# Patient Record
Sex: Female | Born: 1990
Health system: Southern US, Community
[De-identification: ages and names within clinical notes are randomized; demographics above are authoritative.]

## PROBLEM LIST (undated history)

## (undated) ENCOUNTER — Inpatient Hospital Stay (HOSPITAL_COMMUNITY): Payer: Self-pay

## (undated) DIAGNOSIS — J45909 Unspecified asthma, uncomplicated: Secondary | ICD-10-CM

## (undated) DIAGNOSIS — O24419 Gestational diabetes mellitus in pregnancy, unspecified control: Secondary | ICD-10-CM

## (undated) DIAGNOSIS — G47411 Narcolepsy with cataplexy: Secondary | ICD-10-CM

## (undated) DIAGNOSIS — G932 Benign intracranial hypertension: Principal | ICD-10-CM

## (undated) DIAGNOSIS — F32A Depression, unspecified: Secondary | ICD-10-CM

## (undated) DIAGNOSIS — F329 Major depressive disorder, single episode, unspecified: Secondary | ICD-10-CM

## (undated) DIAGNOSIS — B999 Unspecified infectious disease: Secondary | ICD-10-CM

## (undated) DIAGNOSIS — R87629 Unspecified abnormal cytological findings in specimens from vagina: Secondary | ICD-10-CM

## (undated) DIAGNOSIS — Z9884 Bariatric surgery status: Secondary | ICD-10-CM

## (undated) DIAGNOSIS — F419 Anxiety disorder, unspecified: Secondary | ICD-10-CM

## (undated) DIAGNOSIS — R630 Anorexia: Secondary | ICD-10-CM

## (undated) DIAGNOSIS — T7840XA Allergy, unspecified, initial encounter: Secondary | ICD-10-CM

## (undated) DIAGNOSIS — F909 Attention-deficit hyperactivity disorder, unspecified type: Secondary | ICD-10-CM

## (undated) DIAGNOSIS — N809 Endometriosis, unspecified: Secondary | ICD-10-CM

## (undated) DIAGNOSIS — Z91410 Personal history of adult physical and sexual abuse: Secondary | ICD-10-CM

## (undated) HISTORY — DX: Allergy, unspecified, initial encounter: T78.40XA

## (undated) HISTORY — PX: GASTRIC BYPASS: SHX52

## (undated) HISTORY — PX: COLPOSCOPY: SHX161

## (undated) HISTORY — DX: Major depressive disorder, single episode, unspecified: F32.9

## (undated) HISTORY — DX: Attention-deficit hyperactivity disorder, unspecified type: F90.9

## (undated) HISTORY — DX: Depression, unspecified: F32.A

## (undated) HISTORY — DX: Anorexia: R63.0

## (undated) HISTORY — DX: Endometriosis, unspecified: N80.9

## (undated) HISTORY — DX: Narcolepsy with cataplexy: G47.411

## (undated) HISTORY — PX: LUMBAR PUNCTURE: SHX1985

## (undated) HISTORY — DX: Anxiety disorder, unspecified: F41.9

## (undated) HISTORY — DX: Personal history of adult physical and sexual abuse: Z91.410

## (undated) HISTORY — PX: COLONOSCOPY: SHX174

## (undated) HISTORY — DX: Unspecified asthma, uncomplicated: J45.909

## (undated) HISTORY — DX: Gestational diabetes mellitus in pregnancy, unspecified control: O24.419

## (undated) HISTORY — DX: Benign intracranial hypertension: G93.2

## (undated) HISTORY — PX: COSMETIC SURGERY: SHX468

## (undated) HISTORY — PX: LAPAROSCOPIC ENDOMETRIOSIS FULGURATION: SUR769

---
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
## 2012-06-25 ENCOUNTER — Telehealth: Payer: Self-pay | Admitting: Diagnostic Neuroimaging

## 2012-06-25 NOTE — Telephone Encounter (Signed)
Called pt and mom to resched 06/26/12 appt. No answer, left vm.

## 2012-06-26 ENCOUNTER — Ambulatory Visit: Payer: Self-pay | Admitting: Diagnostic Neuroimaging

## 2012-07-08 ENCOUNTER — Encounter: Payer: Self-pay | Admitting: Diagnostic Neuroimaging

## 2012-07-08 ENCOUNTER — Ambulatory Visit (INDEPENDENT_AMBULATORY_CARE_PROVIDER_SITE_OTHER): Payer: BC Managed Care – PPO | Admitting: Diagnostic Neuroimaging

## 2012-07-08 VITALS — BP 106/74 | HR 86 | Ht 69.0 in | Wt 281.0 lb

## 2012-07-08 DIAGNOSIS — G932 Benign intracranial hypertension: Secondary | ICD-10-CM

## 2012-07-08 MED ORDER — TOPIRAMATE 50 MG PO TABS: 50.0000 mg | ORAL_TABLET | Freq: Two times a day (BID) | ORAL | Status: DC

## 2012-07-08 NOTE — Progress Notes (Signed)
[facility]  PATIENT: Sarah Estes DOB: [birth year]  REFERRING CLINICIAN: Lyles HISTORY FROM: patient REASON FOR VISIT: follow up  HISTORICAL  CHIEF COMPLAINT:  Chief Complaint  Patient presents with  . pseudotumor cerebri    HISTORY OF PRESENT ILLNESS:   UPDATE 07/08/12: 22 year old female here for evaluation of pseudotumor cerebri. Patient was previously diagnosed and treated by my colleague here in 2012. Since that time patient's symptoms were fairly stable, with serial eye exams. However more recently her symptoms have worsened. She's had history of transient visual obscuration episodes lasting for a few hours, twice per year. More recently she had an episode lasting for several days with significant decreased vision in the right eye. Her serial ophthalmologic and funduscopic exams have not significantly declined although she has subjective decrease visual acuity of the right eye with increasing blind spot. Patient thinks these symptoms have been worsening since December 2013. In terms of her weight she is at her heaviest now, 281 pounds. Reviewing her weight history, patient weighed 215 pounds in 2008, 240 pounds and 2010, 187 pounds in 2011, and currently 281 pounds.  Patient reports previously trying Diamox and Lasix in the past, but stopped taking these medications due to intolerance of side effects.  PRIOR HPI (06/24/10; Dr. Sharene Skeans): "22 year old with endometriosis who was placed on 3 different oral contraceptives in attempt to treat her symptoms. The first made her depressed. The second caused a 30 pound weight gain in 2 months. The third caused blurred vision which did not go away.  Her gynecologist referred her to an ophthalmologist who found evidence of papilledema and made a presumptive diagnosis of pseudotumor cerebri.  I reviewed office notes from Dr. Randon Goldsmith of [facility] for June 15, 2010 and June 22, 2010. He noted  bilateral disc edema and recommended neurological evaluation including MRI, MRV, and depending on the results, lumbar puncture.  I reviewed the MRI scan of the brain without and with contrast which was performed June 15, 2010. This was read as normal, but there is evidence of a partially empty sella turcica. This is the only sign of possible increased intracranial pressure. The optic nerves were normal. MRV was not performed with the venous sinuses appear patent.  Lumbar puncture performed June 16, 2010 showed an opening pressure of 36 cm of water. Taken together, these provided through for the diagnosis of pseudotumor cerebri. The patient was placed on extended release Diamox and had problems of dizziness and drowsiness. Her headaches were not relieved. She was taken off of extended release Diamox and placed on low-dose immediate release Diamox. Her symptoms subsided. She now just has a dull headache."  REVIEW OF SYSTEMS: Full 14 system review of systems performed and notable only for weight gain ringing in the ears trouble swallowing blurred vision double vision loss of vision eye pain memory loss confusion headache difficulty swallowing dizziness passing out insomnia sleepiness.  ALLERGIES: Allergies  Allergen Reactions  . Sulfa Antibiotics     HOME MEDICATIONS: No outpatient prescriptions prior to visit.   No facility-administered medications prior to visit.    PAST MEDICAL HISTORY: Past Medical History  Diagnosis Date  . Endometriosis   . Adult ADHD   . Anxiety and depression   . Pseudotumor cerebri 2011    PAST SURGICAL HISTORY: Past Surgical History  Procedure Laterality Date  . Colonoscopy      FAMILY HISTORY: Family History  Problem Relation Age of Onset  . Diabetes Mother   . Stroke  Maternal Grandmother   . Heart disease Maternal Grandfather     SOCIAL HISTORY:  History   Social History  . Marital Status: Single    Spouse Name: N/A    Number of Children: 0    . Years of Education: N/A   Occupational History  .  [employer]    Pharmacy   Social History Main Topics  . Smoking status: Never Smoker   . Smokeless tobacco: Not on file  . Alcohol Use: No  . Drug Use: No  . Sexually Active: Yes    Birth Control/ Protection: IUD   Other Topics Concern  . Not on file   Social History Narrative   Pt lives at home with her mom, dad, and two brothers.   Caffeine Use- quit using in 04/09/2007     PHYSICAL EXAM  Filed Vitals:   07/08/12 1053  BP: 106/74  Pulse: 86  Height: 5\' 9"  (1.753 m)  Weight: 281 lb (127.461 kg)   Body mass index is 41.48 kg/(m^2).  GENERAL EXAM: Patient is in no distress  CARDIOVASCULAR: Regular rate and rhythm, no murmurs, no carotid bruits  NEUROLOGIC: MENTAL STATUS: awake, alert, language fluent, comprehension intact, naming intact CRANIAL NERVE: BLURRED DISC MARGINS BILATERALLY. Pupils equal and reactive to light, visual fields full to confrontation, extraocular muscles intact, no nystagmus, facial sensation and strength symmetric, uvula midline, shoulder shrug symmetric, tongue midline. MOTOR: normal bulk and tone, full strength in the BUE, BLE SENSORY: normal and symmetric to light touch, pinprick, temperature, vibration COORDINATION: finger-nose-finger, fine finger movements normal REFLEXES: deep tendon reflexes present and symmetric GAIT/STATION: narrow based gait; able to walk on toes, heels and tandem; romberg is negative   DIAGNOSTIC DATA (LABS, IMAGING, TESTING) - I reviewed patient records, labs, notes, testing and imaging myself where available.  Lab Results  Component Value Date   WBC 14.7* 11/12/2011   HGB 15.1* 11/12/2011   HCT 42.9 11/12/2011   MCV 88.5 11/12/2011   PLT 366 11/12/2011      Component Value Date/Time   NA 137 11/12/2011 1316   K 5.6* 11/12/2011 1316   CL 104 11/12/2011 1316   CO2 23 11/12/2011 1316   GLUCOSE 99 11/12/2011 1316   BUN 20 11/12/2011 1316   CREATININE 0.55  11/12/2011 1316   CALCIUM 9.4 11/12/2011 1316   GFRNONAA >90 11/12/2011 1316   GFRAA >90 11/12/2011 1316   No results found for this basename: CHOL, HDL, LDLCALC, LDLDIRECT, TRIG, CHOLHDL   No results found for this basename: HGBA1C   No results found for this basename: VITAMINB12   No results found for this basename: TSH     ASSESSMENT AND PLAN  22 y.o. year old female  has a past medical history of Endometriosis; Adult ADHD; Anxiety and depression; and Pseudotumor cerebri (2011). here with pseudotumor cerebri, worsening visual acuity and increasing right eye transient visual obscuration episode. We'll start patient on topiramate. I will check MRI of the brain. Then I will check lumbar puncture. It opening pressure is significantly elevated, then we may need to reconsider a trial of Diamox versus Lasix, even though patient could not tolerate these medications in the past.   Orders Placed This Encounter  Procedures  . MR Brain Wo Contrast    Suanne Marker, MD 07/08/2012, 11:20 AM Certified in Neurology, Neurophysiology and Neuroimaging  [facility] [address], [address] [place], [place] [address] [phone]

## 2012-07-08 NOTE — Patient Instructions (Signed)
Start topiramate 50mg  at bedtime; after 2 weeks increase to twice a day Discuss with PCP about weight loss strategies.

## 2012-07-10 ENCOUNTER — Ambulatory Visit (INDEPENDENT_AMBULATORY_CARE_PROVIDER_SITE_OTHER): Payer: BC Managed Care – PPO

## 2012-07-10 DIAGNOSIS — G932 Benign intracranial hypertension: Secondary | ICD-10-CM

## 2012-07-11 NOTE — Procedures (Signed)
[facility]  NEUROIMAGING REPORT   STUDY DATE: 07/10/12 PATIENT NAME: Sarah Estes DOB: 08-Feb-1991 MRN: [id number]  ORDERING CLINICIAN: Joycelyn Schmid, MD  CLINICAL HISTORY: 22 year old female with pseudotumor cerebri.  EXAM: MRI brain (without)  TECHNIQUE: MRI of the brain without contrast was obtained utilizing 5 mm axial slices with T1, T2, T2 flair, T2 star gradient echo and diffusion weighted views.  T1 sagittal and T2 coronal views were obtained. CONTRAST: no IMAGING SITE: [facility] [address]   FINDINGS:  No abnormal lesions are seen on diffusion-weighted views to suggest acute ischemia. The cortical sulci, fissures and cisterns are normal in size and appearance. Lateral, third and fourth ventricle are normal in size and appearance. No extra-axial fluid collections are seen. No evidence of mass effect or midline shift.    On sagittal views the posterior fossa, pituitary gland and corpus callosum are notable for partially empty sella. No evidence of intracranial hemorrhage on gradient-echo views. The orbits and their contents, paranasal sinuses and calvarium are notable for mucosal thickening in the right ethmoid and right maxillary sinuses. Mild enlargement of optic nerve sheaths. Intracranial flow voids are present.  IMPRESSION:  Equivocal MRI brain (without) demonstrating partially empty sella and mild enlargement of optic nerve sheaths, which is non-specific but can be seen in association with pseudotumor cerebri. No change since MRI on 06/15/10.   INTERPRETING PHYSICIAN:  Suanne Marker, MD Certified in Neurology, Neurophysiology and Neuroimaging  [facility] [address], [address] [place], [place] [address] [phone]

## 2012-07-16 ENCOUNTER — Other Ambulatory Visit: Payer: Self-pay | Admitting: Diagnostic Neuroimaging

## 2012-07-16 ENCOUNTER — Telehealth: Payer: Self-pay

## 2012-07-16 DIAGNOSIS — G932 Benign intracranial hypertension: Secondary | ICD-10-CM

## 2012-07-16 NOTE — Telephone Encounter (Signed)
I called pt and let her know about the LP ordered at [facility].   Pt is to be called.  I made her aware that message placed to Dr.Penumalli about zofran request.  Hopefully hear something this pm.

## 2012-07-16 NOTE — Telephone Encounter (Signed)
Patient called and said she is taking her Topamax, but has been nauseated since on this med.  She says it is starting to get worse, to the point she feels like she may not be able to work.  Says it feels like a bad stomach bug.  She would like a rx for nausea called in to the pharmacy.  Said she does not need a return call unless necessary because she works at [employer] and will know when the rx is called in.  Please advise.  Thank you.

## 2012-07-16 NOTE — Telephone Encounter (Signed)
Pt also called about LP order.   Consulted Dr. Marjory Lies and order for LP placed.

## 2012-07-19 ENCOUNTER — Ambulatory Visit
Admission: RE | Admit: 2012-07-19 | Discharge: 2012-07-19 | Disposition: A | Discharge status: Home / Self Care | Payer: BC Managed Care – PPO | Source: Ambulatory Visit | Attending: Diagnostic Neuroimaging | Admitting: Diagnostic Neuroimaging

## 2012-07-19 ENCOUNTER — Telehealth: Payer: Self-pay

## 2012-07-19 ENCOUNTER — Telehealth: Payer: Self-pay | Admitting: Diagnostic Neuroimaging

## 2012-07-19 VITALS — BP 106/71 | HR 71

## 2012-07-19 DIAGNOSIS — G932 Benign intracranial hypertension: Secondary | ICD-10-CM

## 2012-07-19 LAB — CSF CELL COUNT WITH DIFFERENTIAL: RBC Count, CSF: 0 cu mm

## 2012-07-19 LAB — GLUCOSE, CSF: Glucose, CSF: 56 mg/dL (ref 43–76)

## 2012-07-19 LAB — GRAM STAIN
Gram Stain: NONE SEEN
Gram Stain: NONE SEEN

## 2012-07-19 NOTE — Progress Notes (Signed)
Discharge instructions explained to pt and her Mom. 

## 2012-07-19 NOTE — Telephone Encounter (Signed)
[facility] called to report that CSF no WBCs seen or no organisms seen.

## 2012-07-19 NOTE — Telephone Encounter (Signed)
[facility] states pt is requesting to add Lyme test to LP. Asked tech if pt thinks she has been exposed to tick bite, pt responded she is not sure but has been doing research and would like to have test done. Explained would ask Dr. Marjory Lies when he returns. Agreed.

## 2012-07-19 NOTE — Telephone Encounter (Signed)
Called pt to see how she was doing and to give MRI results. Pt says she is tolerating nausea a little better now  because she is HA free, and would rather be nauseos than in pain. Will call if symptoms worsen. Advised to contact PCP also, she agreed.

## 2012-07-19 NOTE — Telephone Encounter (Signed)
Sarah Estes called the patient and spoke with her.  Phone call noted in chart:  Pt says she is tolerating nausea a little better now because she is HA free, and would rather be nauseos than in pain. Will call if symptoms worsen. Advised to contact PCP also, she agreed.

## 2012-07-20 ENCOUNTER — Encounter (HOSPITAL_COMMUNITY): Payer: Self-pay

## 2012-07-20 ENCOUNTER — Emergency Department (HOSPITAL_COMMUNITY)
Admission: EM | Admit: 2012-07-20 | Discharge: 2012-07-20 | Disposition: A | Discharge status: Home / Self Care | Payer: BC Managed Care – PPO | Attending: Emergency Medicine | Admitting: Emergency Medicine

## 2012-07-20 DIAGNOSIS — E860 Dehydration: Secondary | ICD-10-CM | POA: Insufficient documentation

## 2012-07-20 DIAGNOSIS — R1110 Vomiting, unspecified: Secondary | ICD-10-CM

## 2012-07-20 DIAGNOSIS — R197 Diarrhea, unspecified: Secondary | ICD-10-CM | POA: Insufficient documentation

## 2012-07-20 DIAGNOSIS — R51 Headache: Secondary | ICD-10-CM

## 2012-07-20 DIAGNOSIS — Z8669 Personal history of other diseases of the nervous system and sense organs: Secondary | ICD-10-CM | POA: Insufficient documentation

## 2012-07-20 DIAGNOSIS — F341 Dysthymic disorder: Secondary | ICD-10-CM | POA: Insufficient documentation

## 2012-07-20 DIAGNOSIS — Z3202 Encounter for pregnancy test, result negative: Secondary | ICD-10-CM | POA: Insufficient documentation

## 2012-07-20 DIAGNOSIS — R112 Nausea with vomiting, unspecified: Secondary | ICD-10-CM | POA: Insufficient documentation

## 2012-07-20 DIAGNOSIS — F909 Attention-deficit hyperactivity disorder, unspecified type: Secondary | ICD-10-CM | POA: Insufficient documentation

## 2012-07-20 DIAGNOSIS — Z8742 Personal history of other diseases of the female genital tract: Secondary | ICD-10-CM | POA: Insufficient documentation

## 2012-07-20 DIAGNOSIS — R34 Anuria and oliguria: Secondary | ICD-10-CM | POA: Insufficient documentation

## 2012-07-20 DIAGNOSIS — Z79899 Other long term (current) drug therapy: Secondary | ICD-10-CM | POA: Insufficient documentation

## 2012-07-20 DIAGNOSIS — R42 Dizziness and giddiness: Secondary | ICD-10-CM | POA: Insufficient documentation

## 2012-07-20 LAB — URINALYSIS, ROUTINE W REFLEX MICROSCOPIC
Glucose, UA: NEGATIVE mg/dL
Protein, ur: NEGATIVE mg/dL
Urobilinogen, UA: 0.2 mg/dL (ref 0.0–1.0)

## 2012-07-20 LAB — POCT PREGNANCY, URINE: Preg Test, Ur: NEGATIVE

## 2012-07-20 LAB — CBC WITH DIFFERENTIAL/PLATELET
Basophils Relative: 0 % (ref 0–1)
Eosinophils Absolute: 0.2 10*3/uL (ref 0.0–0.7)
Lymphs Abs: 2.3 10*3/uL (ref 0.7–4.0)
MCH: 30.4 pg (ref 26.0–34.0)
Neutrophils Relative %: 77 % (ref 43–77)
Platelets: 355 10*3/uL (ref 150–400)
RBC: 4.83 MIL/uL (ref 3.87–5.11)

## 2012-07-20 LAB — POCT I-STAT, CHEM 8
HCT: 44 % (ref 36.0–46.0)
Hemoglobin: 15 g/dL (ref 12.0–15.0)
Sodium: 142 mEq/L (ref 135–145)
TCO2: 20 mmol/L (ref 0–100)

## 2012-07-20 LAB — URINE MICROSCOPIC-ADD ON

## 2012-07-20 MED ORDER — SODIUM CHLORIDE 0.9 % IV BOLUS (SEPSIS)
1000.0000 mL | Freq: Once | INTRAVENOUS | Status: AC
  Administered 2012-07-20: 1000 mL via INTRAVENOUS

## 2012-07-20 MED ORDER — METOCLOPRAMIDE HCL 5 MG/ML IJ SOLN
10.0000 mg | Freq: Once | INTRAMUSCULAR | Status: AC
  Administered 2012-07-20: 10 mg via INTRAVENOUS
  Filled 2012-07-20: qty 2

## 2012-07-20 MED ORDER — DIPHENHYDRAMINE HCL 50 MG/ML IJ SOLN
25.0000 mg | Freq: Once | INTRAMUSCULAR | Status: AC
  Administered 2012-07-20: 25 mg via INTRAVENOUS
  Filled 2012-07-20: qty 1

## 2012-07-20 NOTE — ED Notes (Signed)
Pt aware of the need for a urine sample, will wait until after fluids are administered.

## 2012-07-20 NOTE — ED Notes (Signed)
Pt was sleeping when this RN walked into room.  Pt sts she is still unable to urinate.

## 2012-07-20 NOTE — ED Notes (Signed)
MD at bedside. 

## 2012-07-20 NOTE — ED Provider Notes (Addendum)
History     CSN: [id number]  Arrival date & time 07/20/12  1655   First MD Initiated Contact with Patient 07/20/12 1657      Chief Complaint  Patient presents with  . Back Pain    (Consider location/radiation/quality/duration/timing/severity/associated sxs/prior treatment) HPI Comments: Pt had LP done for her pseudotumor cerebri yesterday as scheduled and stated after finished and numbing meds wore off she developed left side of body pain and headache.  Also so developed severe nausea, 3 episodes of vomiting and profuse diarrhea.  She states she has not urinated in 30 hours and has no urge to go but has been drinking fluids.  Pt denies any abd pain and HA is worse with sitting or standing with mild lightheadedness with standing.  Denies any fever or neck pain.  No confusion and normal gait.  Patient is a 22 y.o. female presenting with diarrhea. The history is provided by the patient.  Diarrhea Quality:  Watery Severity:  Severe Onset quality:  Sudden Number of episodes:  Numerous Duration:  24 hours Timing:  Constant Progression:  Unchanged Relieved by:  Nothing Worsened by:  Nothing tried Ineffective treatments:  None tried Associated symptoms: headaches and vomiting   Associated symptoms: no abdominal pain, no chills, no recent cough, no diaphoresis and no fever   Vomiting:    Quality:  Stomach contents   Number of occurrences:  3   Severity:  Mild   Duration:  24 hours   Timing:  Sporadic   Progression:  Unchanged   Past Medical History  Diagnosis Date  . Endometriosis   . Adult ADHD   . Anxiety and depression   . Pseudotumor cerebri 2011    Past Surgical History  Procedure Laterality Date  . Colonoscopy      Family History  Problem Relation Age of Onset  . Diabetes Mother   . Stroke Maternal Grandmother   . Heart disease Maternal Grandfather     History  Substance Use Topics  . Smoking status: Never Smoker   . Smokeless tobacco: Not on file  .  Alcohol Use: No    OB History   Grav Para Term Preterm Abortions TAB SAB Ect Mult Living                  Review of Systems  Constitutional: Negative for fever, chills and diaphoresis.  HENT: Negative for neck pain and neck stiffness.   Respiratory: Negative for cough and shortness of breath.   Gastrointestinal: Positive for nausea, vomiting and diarrhea. Negative for abdominal pain.  Neurological: Positive for light-headedness and headaches. Negative for speech difficulty, weakness and numbness.  All other systems reviewed and are negative.    Allergies  Gluten meal and Sulfa antibiotics  Home Medications   Current Outpatient Rx  Name  Route  Sig  Dispense  Refill  . albuterol (PROVENTIL HFA;VENTOLIN HFA) 108 (90 BASE) MCG/ACT inhaler   Inhalation   Inhale 2 puffs into the lungs every 6 (six) hours as needed for wheezing.         Marland Kitchen amphetamine-dextroamphetamine (ADDERALL) 30 MG tablet   Oral   Take 1 tablet by mouth 2 (two) times daily as needed (ADD).          Marland Kitchen ibuprofen (ADVIL,MOTRIN) 200 MG tablet   Oral   Take 400 mg by mouth every 6 (six) hours as needed for pain.         Marland Kitchen ondansetron (ZOFRAN) 8 MG tablet   Oral  Take 8 mg by mouth every 8 (eight) hours as needed for nausea.         Marland Kitchen topiramate (TOPAMAX) 50 MG tablet   Oral   Take 50 mg by mouth daily.           BP 132/87  Pulse 84  Temp(Src) 99 F (37.2 C) (Oral)  Resp 16  SpO2 99%  Physical Exam  Nursing note and vitals reviewed. Constitutional: She is oriented to person, place, and time. She appears well-developed and well-nourished. No distress.  HENT:  Head: Normocephalic and atraumatic.  Right Ear: Tympanic membrane and ear canal normal.  Left Ear: Tympanic membrane and ear canal normal.  Eyes: EOM are normal. Pupils are equal, round, and reactive to light.  Neck: Normal range of motion. Neck supple. No spinous process tenderness and no muscular tenderness present. No  Brudzinski's sign and no Kernig's sign noted.  Cardiovascular: Normal rate, regular rhythm, normal heart sounds and intact distal pulses.  Exam reveals no friction rub.   No murmur heard. Pulmonary/Chest: Effort normal and breath sounds normal. She has no wheezes. She has no rales.  Abdominal: Soft. Bowel sounds are normal. She exhibits no distension. There is no tenderness. There is no rebound and no guarding.  Genitourinary: Rectal exam shows anal tone normal.  Musculoskeletal: Normal range of motion. She exhibits no tenderness.  No edema  Neurological: She is alert and oriented to person, place, and time. She has normal strength. No cranial nerve deficit or sensory deficit. Coordination and gait normal.  Skin: Skin is warm and dry. No rash noted.  Psychiatric: She has a normal mood and affect. Her behavior is normal.    ED Course  Procedures (including critical care time)  Labs Reviewed  CBC WITH DIFFERENTIAL - Abnormal; Notable for the following:    WBC 13.4 (*)    Neutro Abs 10.3 (*)    All other components within normal limits  POCT I-STAT, CHEM 8 - Abnormal; Notable for the following:    Calcium, Ion 1.25 (*)    All other components within normal limits  URINALYSIS, ROUTINE W REFLEX MICROSCOPIC   Dg Fluoro Guide Lumbar Puncture  07/19/2012  *RADIOLOGY REPORT*  Clinical Data:  Pseudotumor cerebri with headaches.  DIAGNOSTIC LUMBAR PUNCTURE UNDER FLUOROSCOPIC GUIDANCE  Fluoroscopy time:  24 seconds.  Technique:  Informed consent was obtained from the patient prior to the procedure, including potential complications of headache, allergy, and pain.   With the patient prone, the lower back was prepped with Betadine.  1% Lidocaine was used for local anesthesia. Lumbar puncture was performed at the L4-L5 level using a 5 inches, 22 gauge needle with return of clear CSF with an opening pressure of 28 cm water.   12 ml of CSF were obtained for laboratory studies. An additional 12 ml of CSF  was removed.  Closing pressure was 15 cm water.  The patient tolerated the procedure well and there were no apparent complications.  IMPRESSION: Technically  successful diagnostic and therapeutic lumbar puncture with elevated opening pressure of 28 cm.  Clear CSF.   Original Report Authenticated By: Davonna Belling, M.D.      1. Vomiting and diarrhea   2. Dehydration   3. Headache       MDM   Pt with hx of pseudotumor who had LP done yesterday and since that time she has had left sided body pain and N/V/D.  Also HA that is suggestive of post LP headache that is  worse with sitting up and better with lying down but never goes away.  States has not urinated in about 30 hours but no urge to urinate and no signs of acute urinary retention on bedside U/S.  Pt denies fecal incontinence and has normal rectal tone.  Concern for post-LP head and dehydration due to V/D.  Only 3 episodes of vomiting but profuse diarrhea. She spoke with [facility] and they recommended she come here.  Will hydrate and check CBC and i-stat, UA and UPT.  Pt given IV fluids, reglan and benadryl.  9:14 PM After 2L of fluid and Headache cocktail pt feels much better and was able to urinate without difficulty.  She is requesting d/c and will f/u with radiology prn      Gwyneth Sprout, MD 07/20/12 2115  Gwyneth Sprout, MD 07/20/12 2116

## 2012-07-20 NOTE — ED Notes (Addendum)
Pt c/o low back pain, headache, n/v and anuria, which began after a lumbar puncture yesterday.  Pain score 5/10.  Pt sts she has not urinated in almost 30hrs.  Vitals are stable.  A & Ox4.  Pt sts she has taken Tylenol and Ibuprofen without relief.

## 2012-07-22 ENCOUNTER — Telehealth: Payer: Self-pay | Admitting: Radiology

## 2012-07-22 NOTE — Telephone Encounter (Signed)
Pt states she went to the ER, Saturday after LP on Friday. Pt states she could not urinate and had diarrhea. She also has a positional headache and a back ache.  Explained that I was not sure what caused the urinary problem or the diarrhea, she should follow up with her primary care physician. Backache could be treated with tylenol and cold packs to injection site. Headache would probably go away with bedrest.

## 2012-07-22 NOTE — Telephone Encounter (Signed)
Pt called back and wanted to speak to doctor. Dr. Benard Rink notified and given info and will return pt's call.

## 2012-08-01 ENCOUNTER — Telehealth: Payer: Self-pay | Admitting: *Deleted

## 2012-08-01 ENCOUNTER — Telehealth: Payer: Self-pay | Admitting: Diagnostic Neuroimaging

## 2012-08-01 NOTE — Telephone Encounter (Signed)
Patient not at work until 230pm today. Will call back.

## 2012-08-01 NOTE — Telephone Encounter (Signed)
Please return patient's call asap

## 2012-08-05 NOTE — Telephone Encounter (Signed)
A Staff Message was sent to Dr Marjory Lies.regarding this call.

## 2012-08-06 NOTE — Telephone Encounter (Signed)
I called and LMVM for pt that f/u on message left 08-02-12 about increasing topamax.  LMVM for pt to return call. /

## 2012-08-07 ENCOUNTER — Telehealth: Payer: Self-pay | Admitting: Diagnostic Neuroimaging

## 2012-08-07 DIAGNOSIS — G932 Benign intracranial hypertension: Secondary | ICD-10-CM

## 2012-08-07 MED ORDER — TOPIRAMATE 50 MG PO TABS: 100.0000 mg | ORAL_TABLET | Freq: Two times a day (BID) | ORAL | Status: DC

## 2012-08-07 NOTE — Telephone Encounter (Signed)
I called pt and she relayed that she has had headaches that are now constant, at night she has traveling ice pick type headaches.  Advil and tylenol do not work.  She is taking topamax 50mg  po bid and is asking if she can try an increase.   I consulted Dr. Marjory Lies and he stated that she can try 50mg  po am and 100mg  po pm and after one week and increase to 100 mg po bid if needs too.  Call into [employer]. [address].  CSF studies ok, pressure 28.  Pt informed.  I told her I did not see the lyme study and would f/u on this.  She verbalized understanding.

## 2012-08-07 NOTE — Telephone Encounter (Signed)
Patient called stating she has been taking Topamax 50mg  x2 daily and it was working, but now her headaches have returned stronger. Patient would like to know if maybe she needs a dose increase for Topamax.

## 2012-08-08 ENCOUNTER — Other Ambulatory Visit: Payer: Self-pay | Admitting: Diagnostic Neuroimaging

## 2012-08-08 ENCOUNTER — Other Ambulatory Visit: Payer: Self-pay

## 2012-08-08 DIAGNOSIS — G932 Benign intracranial hypertension: Secondary | ICD-10-CM

## 2012-08-08 MED ORDER — TOPIRAMATE 50 MG PO TABS: 100.0000 mg | ORAL_TABLET | Freq: Two times a day (BID) | ORAL | Status: DC

## 2012-08-08 NOTE — Telephone Encounter (Signed)
Resending Rx

## 2012-08-09 DIAGNOSIS — J45909 Unspecified asthma, uncomplicated: Secondary | ICD-10-CM | POA: Insufficient documentation

## 2012-08-09 DIAGNOSIS — Z823 Family history of stroke: Secondary | ICD-10-CM | POA: Insufficient documentation

## 2012-08-09 DIAGNOSIS — N809 Endometriosis, unspecified: Secondary | ICD-10-CM | POA: Insufficient documentation

## 2012-08-09 NOTE — Telephone Encounter (Signed)
I spoke to [facility] at the [employer] [facility] [employer] and relayed the topamax instructions ( 50mg  po am and 100mg  po pm if after one week may take 100mg  po bid).  # 120 with 3 refills. [phone]

## 2012-08-16 ENCOUNTER — Telehealth: Payer: Self-pay

## 2012-08-16 NOTE — Telephone Encounter (Signed)
Message copied by Doree Barthel on Fri Aug 16, 2012  4:18 PM ------      Message from: Philipp Ovens R      Created: Mon Jul 08, 2012 11:20 AM      Regarding: appt       Pt needs 3mos FU. ------

## 2012-08-16 NOTE — Telephone Encounter (Signed)
Called pt to sched 3 month f/u appt. No answer, sent letter.

## 2012-09-04 ENCOUNTER — Telehealth: Payer: Self-pay | Admitting: Diagnostic Neuroimaging

## 2012-09-05 ENCOUNTER — Telehealth: Payer: Self-pay | Admitting: Diagnostic Neuroimaging

## 2012-09-05 NOTE — Telephone Encounter (Signed)
I called and spoke with patient concerning her message. Patient stated her pcp Sharin Grave, NP at [facility]  would like to know what type of anxiety medication can she  prescribe the patient without raising her pressure and affecting her vision.

## 2012-09-05 NOTE — Telephone Encounter (Signed)
Patient is calling to tell us she thinks she needs a new medication to help with her anxiety.  You can reach her at:  [phone]

## 2012-09-05 NOTE — Telephone Encounter (Signed)
Any anti-anxiety medication should work (SSRI, benzos etc). -VRP

## 2012-09-06 ENCOUNTER — Other Ambulatory Visit: Payer: Self-pay | Admitting: Family Medicine

## 2012-09-06 DIAGNOSIS — M545 Low back pain, unspecified: Secondary | ICD-10-CM

## 2012-09-06 NOTE — Telephone Encounter (Signed)
I called and left a message for the patient that per Dr. Richrd Humbles note that any anti-anxiety medication such as (SSRI or benzo..etc).

## 2012-09-10 NOTE — Telephone Encounter (Signed)
Called pt to make sure previous message SG left was received. No answer.

## 2012-09-17 ENCOUNTER — Ambulatory Visit
Admission: RE | Admit: 2012-09-17 | Discharge: 2012-09-17 | Disposition: A | Discharge status: Home / Self Care | Payer: BC Managed Care – PPO | Source: Ambulatory Visit | Attending: Family Medicine | Admitting: Family Medicine

## 2012-09-17 DIAGNOSIS — M545 Low back pain, unspecified: Secondary | ICD-10-CM

## 2012-09-23 ENCOUNTER — Other Ambulatory Visit: Payer: BC Managed Care – PPO

## 2012-12-16 ENCOUNTER — Telehealth: Payer: Self-pay | Admitting: *Deleted

## 2012-12-16 NOTE — Telephone Encounter (Signed)
Pt calling re: getting recommendations from Dr. Marjory Lies about what anti - anxiety meds can be used for pt.  Pcp has call several times.  I called and LMVM for pt that no recent calls that I see, last 6/14 note same request and message left for her.  I gave these to her and faxed to [facility] [phone] re: this.  (SSRI and Benzodiazepines)

## 2013-04-04 ENCOUNTER — Encounter (HOSPITAL_COMMUNITY): Payer: Self-pay | Admitting: Emergency Medicine

## 2013-04-04 ENCOUNTER — Emergency Department (HOSPITAL_COMMUNITY)
Admission: EM | Admit: 2013-04-04 | Discharge: 2013-04-04 | Disposition: A | Discharge status: Home / Self Care | Payer: BC Managed Care – PPO | Attending: Emergency Medicine | Admitting: Emergency Medicine

## 2013-04-04 ENCOUNTER — Emergency Department (HOSPITAL_COMMUNITY): Payer: BC Managed Care – PPO

## 2013-04-04 DIAGNOSIS — R300 Dysuria: Secondary | ICD-10-CM | POA: Insufficient documentation

## 2013-04-04 DIAGNOSIS — N925 Other specified irregular menstruation: Secondary | ICD-10-CM | POA: Insufficient documentation

## 2013-04-04 DIAGNOSIS — F341 Dysthymic disorder: Secondary | ICD-10-CM | POA: Insufficient documentation

## 2013-04-04 DIAGNOSIS — Z888 Allergy status to other drugs, medicaments and biological substances status: Secondary | ICD-10-CM | POA: Insufficient documentation

## 2013-04-04 DIAGNOSIS — Z9884 Bariatric surgery status: Secondary | ICD-10-CM | POA: Insufficient documentation

## 2013-04-04 DIAGNOSIS — R112 Nausea with vomiting, unspecified: Secondary | ICD-10-CM | POA: Insufficient documentation

## 2013-04-04 DIAGNOSIS — G932 Benign intracranial hypertension: Secondary | ICD-10-CM | POA: Insufficient documentation

## 2013-04-04 DIAGNOSIS — N938 Other specified abnormal uterine and vaginal bleeding: Secondary | ICD-10-CM | POA: Insufficient documentation

## 2013-04-04 DIAGNOSIS — R1032 Left lower quadrant pain: Secondary | ICD-10-CM

## 2013-04-04 DIAGNOSIS — K5900 Constipation, unspecified: Secondary | ICD-10-CM | POA: Insufficient documentation

## 2013-04-04 DIAGNOSIS — F909 Attention-deficit hyperactivity disorder, unspecified type: Secondary | ICD-10-CM | POA: Insufficient documentation

## 2013-04-04 DIAGNOSIS — Z8742 Personal history of other diseases of the female genital tract: Secondary | ICD-10-CM | POA: Insufficient documentation

## 2013-04-04 DIAGNOSIS — Z882 Allergy status to sulfonamides status: Secondary | ICD-10-CM | POA: Insufficient documentation

## 2013-04-04 DIAGNOSIS — Z79899 Other long term (current) drug therapy: Secondary | ICD-10-CM | POA: Insufficient documentation

## 2013-04-04 DIAGNOSIS — N949 Unspecified condition associated with female genital organs and menstrual cycle: Secondary | ICD-10-CM | POA: Insufficient documentation

## 2013-04-04 DIAGNOSIS — Z3202 Encounter for pregnancy test, result negative: Secondary | ICD-10-CM | POA: Insufficient documentation

## 2013-04-04 HISTORY — DX: Bariatric surgery status: Z98.84

## 2013-04-04 LAB — CBC WITH DIFFERENTIAL/PLATELET
BASOS ABS: 0 10*3/uL (ref 0.0–0.1)
Basophils Relative: 0 % (ref 0–1)
EOS ABS: 0 10*3/uL (ref 0.0–0.7)
EOS PCT: 0 % (ref 0–5)
HEMATOCRIT: 40.7 % (ref 36.0–46.0)
Hemoglobin: 14.8 g/dL (ref 12.0–15.0)
LYMPHS ABS: 2 10*3/uL (ref 0.7–4.0)
LYMPHS PCT: 19 % (ref 12–46)
MCH: 33 pg (ref 26.0–34.0)
MCHC: 36.4 g/dL — ABNORMAL HIGH (ref 30.0–36.0)
MCV: 90.8 fL (ref 78.0–100.0)
MONO ABS: 0.6 10*3/uL (ref 0.1–1.0)
Monocytes Relative: 5 % (ref 3–12)
Neutro Abs: 8.3 10*3/uL — ABNORMAL HIGH (ref 1.7–7.7)
Neutrophils Relative %: 76 % (ref 43–77)
Platelets: 303 10*3/uL (ref 150–400)
RBC: 4.48 MIL/uL (ref 3.87–5.11)
RDW: 14.2 % (ref 11.5–15.5)
WBC: 11 10*3/uL — AB (ref 4.0–10.5)

## 2013-04-04 LAB — WET PREP, GENITAL
Trich, Wet Prep: NONE SEEN
Yeast Wet Prep HPF POC: NONE SEEN

## 2013-04-04 LAB — POCT PREGNANCY, URINE: Preg Test, Ur: NEGATIVE

## 2013-04-04 LAB — URINE MICROSCOPIC-ADD ON

## 2013-04-04 LAB — URINALYSIS, ROUTINE W REFLEX MICROSCOPIC
GLUCOSE, UA: NEGATIVE mg/dL
Hgb urine dipstick: NEGATIVE
Leukocytes, UA: NEGATIVE
Nitrite: NEGATIVE
PH: 5.5 (ref 5.0–8.0)
PROTEIN: 30 mg/dL — AB
Specific Gravity, Urine: 1.035 — ABNORMAL HIGH (ref 1.005–1.030)
Urobilinogen, UA: 0.2 mg/dL (ref 0.0–1.0)

## 2013-04-04 LAB — COMPREHENSIVE METABOLIC PANEL
ALBUMIN: 4.2 g/dL (ref 3.5–5.2)
ALT: 12 U/L (ref 0–35)
AST: 14 U/L (ref 0–37)
Alkaline Phosphatase: 78 U/L (ref 39–117)
BILIRUBIN TOTAL: 0.8 mg/dL (ref 0.3–1.2)
BUN: 15 mg/dL (ref 6–23)
CHLORIDE: 101 meq/L (ref 96–112)
CO2: 19 meq/L (ref 19–32)
CREATININE: 0.66 mg/dL (ref 0.50–1.10)
Calcium: 9.4 mg/dL (ref 8.4–10.5)
GFR calc Af Amer: >90 mL/min (ref >90)
Glucose, Bld: 65 mg/dL — ABNORMAL LOW (ref 70–99)
POTASSIUM: 4 meq/L (ref 3.7–5.3)
SODIUM: 139 meq/L (ref 137–147)
Total Protein: 7.6 g/dL (ref 6.0–8.3)

## 2013-04-04 LAB — LIPASE, BLOOD: Lipase: 20 U/L (ref 11–59)

## 2013-04-04 MED ORDER — MORPHINE SULFATE 4 MG/ML IJ SOLN
4.0000 mg | Freq: Once | INTRAMUSCULAR | Status: AC
  Administered 2013-04-04: 4 mg via INTRAVENOUS
  Filled 2013-04-04: qty 1

## 2013-04-04 MED ORDER — HYDROCODONE-ACETAMINOPHEN 5-325 MG PO TABS: 1.0000 | ORAL_TABLET | Freq: Four times a day (QID) | ORAL | Status: DC | PRN

## 2013-04-04 MED ORDER — DIPHENHYDRAMINE HCL 50 MG/ML IJ SOLN
12.5000 mg | Freq: Once | INTRAMUSCULAR | Status: AC
  Administered 2013-04-04: 12.5 mg via INTRAVENOUS
  Filled 2013-04-04: qty 1

## 2013-04-04 MED ORDER — POLYETHYLENE GLYCOL 3350 17 GM/SCOOP PO POWD: 17.0000 g | Freq: Two times a day (BID) | ORAL | Status: DC

## 2013-04-04 MED ORDER — ONDANSETRON HCL 4 MG/2ML IJ SOLN
4.0000 mg | Freq: Once | INTRAMUSCULAR | Status: AC
  Administered 2013-04-04: 4 mg via INTRAVENOUS
  Filled 2013-04-04: qty 2

## 2013-04-04 MED ORDER — ONDANSETRON 4 MG PO TBDP: ORAL_TABLET | ORAL | Status: DC

## 2013-04-04 MED ORDER — SODIUM CHLORIDE 0.9 % IV BOLUS (SEPSIS)
1000.0000 mL | Freq: Once | INTRAVENOUS | Status: AC
  Administered 2013-04-04: 1000 mL via INTRAVENOUS

## 2013-04-04 NOTE — ED Notes (Signed)
Pt A&Ox4, ambulatory at discharge, verbalizing no complaints at this time. 

## 2013-04-04 NOTE — ED Notes (Signed)
Pt reports she was standing at work today and felt a sudden sharp abd pain from LLQ radiating into upper abd. Pain has remained since and then she began to have n/v. Pt had gastric bypass in October and called surgeon who instructed her to come to ED for evaluation

## 2013-04-04 NOTE — ED Provider Notes (Signed)
CSN: [id number]     Arrival date & time 04/04/13  1529 History   First MD Initiated Contact with Patient 04/04/13 1742     Chief Complaint  Patient presents with  . Abdominal Pain   (Consider location/radiation/quality/duration/timing/severity/associated sxs/prior Treatment) Patient is a 23 y.o. female presenting with abdominal pain.  Abdominal Pain Associated symptoms: no dysuria and no hematuria    23 yo female presents with acute onset of LLQ pain that started today around 11 am. Patient is s/p gastric bypass at the beginning of October. Patient states she spoke with her surgeon who told her to go to the ED for evaluation. Patient reports pain to be 8/10 sharp, intermittent with radiation from LLQ to epigastrum. Patient states pain is worse with twisting movements, and states it feels like " tearing pain" in the LLQ. PMH significant for abdominal surgery and endometriosis. Patient is currently on her period and is reported to be normal and regular. Patient denies fever/chills, CP, Dyspnea, Diarrhea, HA, and Dizziness. Patient admits to N/V x 1 episode today without Hematemesis after eating jello. Admits to Constipation. States Last normal BM was 5 days ago and she typically has 1 per day or 1 every other day. Today she reports last BM was hard and consisted only of "2 littlle hard terd balls". Patient states she just recently started prozac on 03/22/13 and is aware that it can cause constipation.  Past Medical History  Diagnosis Date  . Endometriosis   . Adult ADHD   . Anxiety and depression   . Pseudotumor cerebri 2011  . H/O gastric bypass    Past Surgical History  Procedure Laterality Date  . Colonoscopy    . Lumbar puncture    . Laparoscopic endometriosis fulguration     Family History  Problem Relation Age of Onset  . Diabetes Mother   . Stroke Maternal Grandmother   . Heart disease Maternal Grandfather    History  Substance Use Topics  . Smoking status: Never Smoker   .  Smokeless tobacco: Not on file  . Alcohol Use: No   OB History   Grav Para Term Preterm Abortions TAB SAB Ect Mult Living                 Review of Systems  Gastrointestinal: Positive for abdominal pain. Negative for blood in stool and rectal pain.  Genitourinary: Negative for dysuria, hematuria, difficulty urinating and pelvic pain.  Musculoskeletal: Negative for back pain.  All other systems reviewed and are negative.    Allergies  Corticosteroids; Gluten meal; Nsaids; and Sulfa antibiotics  Home Medications   Current Outpatient Rx  Name  Route  Sig  Dispense  Refill  . albuterol (PROVENTIL HFA;VENTOLIN HFA) 108 (90 BASE) MCG/ACT inhaler   Inhalation   Inhale 2 puffs into the lungs every 6 (six) hours as needed for wheezing.         . bisacodyl (DULCOLAX) 5 MG EC tablet   Oral   Take 5 mg by mouth daily as needed for moderate constipation.         . Calcium Citrate-Vitamin D (CALCIUM CITRATE + D3 PO)   Oral   Take 1 tablet by mouth daily. 500mg  calcium, not sure about vitamin d         . Cyanocobalamin (B-12 PO)   Oral   Take 500 mcg by mouth once a week. Nasal spray: For saturdays         . docusate sodium (COLACE) 100 MG  capsule   Oral   Take 100 mg by mouth daily as needed for mild constipation.         . ferrous sulfate 325 (65 FE) MG EC tablet   Oral   Take 325 mg by mouth daily with breakfast.         . FLUoxetine (PROZAC) 20 MG capsule   Oral   Take 20 mg by mouth daily. For surgery         . LORazepam (ATIVAN) 1 MG tablet   Oral   Take 1 mg by mouth every 8 (eight) hours as needed for anxiety.         . Multiple Vitamin (MULTIVITAMIN WITH MINERALS) TABS tablet   Oral   Take 2 tablets by mouth 2 (two) times daily.         . ondansetron (ZOFRAN) 8 MG tablet   Oral   Take 8 mg by mouth every 8 (eight) hours as needed for nausea.         . promethazine (PHENERGAN) 25 MG tablet   Oral   Take 25 mg by mouth every 6 (six) hours  as needed for nausea or vomiting.         Marland Kitchen. zolpidem (AMBIEN) 10 MG tablet   Oral   Take 5-10 mg by mouth at bedtime as needed for sleep.         Marland Kitchen. HYDROcodone-acetaminophen (NORCO) 5-325 MG per tablet   Oral   Take 1-2 tablets by mouth every 6 (six) hours as needed for severe pain.   20 tablet   0   . levonorgestrel (MIRENA) 20 MCG/24HR IUD   Intrauterine   1 each by Intrauterine route continuous.         . ondansetron (ZOFRAN ODT) 4 MG disintegrating tablet      4mg  ODT q4 hours prn nausea/vomit   20 tablet   0   . polyethylene glycol powder (GLYCOLAX/MIRALAX) powder   Oral   Take 17 g by mouth 2 (two) times daily. Until daily soft stools  OTC   255 g   0    BP 126/89  Pulse 101  Temp(Src) 98.4 F (36.9 C) (Oral)  Resp 18  Ht 5\' 9"  (1.753 m)  Wt 205 lb 6.4 oz (93.169 kg)  BMI 30.32 kg/m2  SpO2 100%  LMP 04/02/2013 Physical Exam  Nursing note and vitals reviewed. Constitutional: She is oriented to person, place, and time. She appears well-developed and well-nourished. No distress.  HENT:  Head: Normocephalic and atraumatic.  Eyes: Conjunctivae and EOM are normal. No scleral icterus.  Cardiovascular: Normal rate, regular rhythm and intact distal pulses.  Exam reveals no gallop and no friction rub.   No murmur heard. Pulmonary/Chest: Effort normal and breath sounds normal. No respiratory distress. She has no wheezes. She has no rales.  Abdominal: Soft. She exhibits no distension, no ascites and no mass. Bowel sounds are increased. There is tenderness in the right upper quadrant, epigastric area, left upper quadrant and left lower quadrant. There is no rigidity, no rebound, no guarding, no tenderness at McBurney's point and negative Murphy's sign.    No suprapubic tenderness. Pain worse with palpation at LLQ. No flank pain.   Genitourinary: Rectum normal and uterus normal. Rectal exam shows no fissure. There is no rash, tenderness or lesion on the right  labia. There is no rash, tenderness or lesion on the left labia. Cervix exhibits no motion tenderness, no discharge and no friability. Right adnexum  displays no mass and no tenderness. Left adnexum displays no mass and no tenderness. No tenderness around the vagina. No vaginal discharge found.  IUD strings visualized at cervical os. Apppears to be appropriately positioned.   Musculoskeletal: Normal range of motion. She exhibits no edema.  Neurological: She is alert and oriented to person, place, and time.  Skin: Skin is warm and dry. She is not diaphoretic.  Psychiatric: She has a normal mood and affect. Her behavior is normal.    ED Course  Procedures (including critical care time) Labs Review Labs Reviewed  WET PREP, GENITAL - Abnormal; Notable for the following:    Clue Cells Wet Prep HPF POC FEW (*)    WBC, Wet Prep HPF POC FEW (*)    All other components within normal limits  COMPREHENSIVE METABOLIC PANEL - Abnormal; Notable for the following:    Glucose, Bld 65 (*)    All other components within normal limits  CBC WITH DIFFERENTIAL - Abnormal; Notable for the following:    WBC 11.0 (*)    MCHC 36.4 (*)    Neutro Abs 8.3 (*)    All other components within normal limits  URINALYSIS, ROUTINE W REFLEX MICROSCOPIC - Abnormal; Notable for the following:    Color, Urine AMBER (*)    APPearance CLOUDY (*)    Specific Gravity, Urine 1.035 (*)    Bilirubin Urine MODERATE (*)    Ketones, ur >80 (*)    Protein, ur 30 (*)    All other components within normal limits  URINE MICROSCOPIC-ADD ON - Abnormal; Notable for the following:    Casts HYALINE CASTS (*)    Crystals CA OXALATE CRYSTALS (*)    All other components within normal limits  GC/CHLAMYDIA PROBE AMP  LIPASE, BLOOD  POCT PREGNANCY, URINE   Imaging Review Dg Abd Acute W/chest  04/04/2013   CLINICAL DATA:  Gastric bypass, left lower quadrant pain  EXAM: ACUTE ABDOMEN SERIES (ABDOMEN 2 VIEW & CHEST 1 VIEW)  COMPARISON:  None.   FINDINGS: There is no evidence of dilated bowel loops or free intraperitoneal air. There is evidence of prior gastric surgery. No radiopaque calculi or other significant radiographic abnormality is seen. There is an intrauterine device present within the pelvis. Heart size and mediastinal contours are within normal limits. Both lungs are clear.  IMPRESSION: Negative abdominal radiographs.  No acute cardiopulmonary disease.   Electronically Signed   By: Elige Ko   On: 04/04/2013 20:12    EKG Interpretation   None       MDM   1. LLQ pain   2. N&V (nausea and vomiting)    Urine preg negative. UA consistent with dehydration. Urine negative for Hgb, patient denies flank pain. Doubt kidney stone.  CMP shows hypoglycemia, patient asymptomatic. CMP otherwise unremarkable. Mild leukocytosis at 11.0, patient afebrile and appears non-toxic and in NAD.  Lipase Neg. Acute abd series shows no signs of obstruction though does show IUD that is positioned on LEFT aspect of pelvis. Pelvic exam appears normal, IUD appears to be appropriately positioned. Few clue cells seen on Wet prep, patient asymptomatic for BV. Will have patient follow up with OBGYN next week.    Patient pain markedly improved with pain management in ED. Patient tolerating POs. Patient does not appear to have a surgical abdomen and states she feels comfortable going home. Recommend followup with patient's surgeon on Monday. Plan to treat patient's sxs until can be seen by surgeon. Patient agrees with plan.  Discharged in good condition.   Meds given in ED:  Medications  ondansetron (ZOFRAN) injection 4 mg (4 mg Intravenous Given 04/04/13 1746)  sodium chloride 0.9 % bolus 1,000 mL (0 mLs Intravenous Stopped 04/04/13 1928)  morphine 4 MG/ML injection 4 mg (4 mg Intravenous Given 04/04/13 1824)  diphenhydrAMINE (BENADRYL) injection 12.5 mg (12.5 mg Intravenous Given 04/04/13 1924)  morphine 4 MG/ML injection 4 mg (4 mg Intravenous Given 04/04/13  2101)    Discharge Medication List as of 04/04/2013  9:59 PM    START taking these medications   Details  HYDROcodone-acetaminophen (NORCO) 5-325 MG per tablet Take 1-2 tablets by mouth every 6 (six) hours as needed for severe pain., Starting 04/04/2013, Until Discontinued, Print    ondansetron (ZOFRAN ODT) 4 MG disintegrating tablet 4mg  ODT q4 hours prn nausea/vomit, Print           Rudene Anda, [place] 04/06/13 1425

## 2013-04-04 NOTE — ED Notes (Signed)
Pt states she developed lower left quadrant pain while at work. Pt had gastric bypass in October, called surgeon and was advised to come here for labwork and ct scan. Pt rates pain 8/10, pt has had some nausea and vomiting.

## 2013-04-04 NOTE — ED Notes (Signed)
Pelvic cart set up and at bedside ready for use.  

## 2013-04-04 NOTE — ED Notes (Signed)
Spoke with X-ray personnel and they indicated that patient should be next.

## 2013-04-04 NOTE — Discharge Instructions (Signed)
Follow up with your surgeon on Monday for complaints of abdominal pain.  Follow up with your gynecologist for reevaluation and proper placement of IUD.  Take medications as directed. If pain continues to progressively worsen despite treatment, unable to hold down any fluids, or begin to develop fever/chills, return to the emergency department. Do not drive or operate machinery while taking prescription pain medication.

## 2013-04-06 LAB — GC/CHLAMYDIA PROBE AMP
CT PROBE, AMP APTIMA: NEGATIVE
GC PROBE AMP APTIMA: NEGATIVE

## 2013-04-06 NOTE — ED Provider Notes (Signed)
  This was a shared visit with a mid-level provided (NP or PA).  Throughout the patient's course I was available for consultation/collaboration.  I saw the ECG (if appropriate), relevant labs and studies - I agree with the interpretation.  On my exam the patient was in no distress.  On multiple repeat evaluations the patient remained in similar condition, smiling, appropriately interactive, in no distress.  Patient's evaluation here is largely reassuring.  Given the absence of notable findings, with her soft abdomen, though she does have a history of bypass, there is no indication for advanced imaging.  With the patient's improvement she is discharged to follow up with her surgeon.      Gerhard Munchobert Tyshun Tuckerman, MD 04/06/13 2253

## 2013-07-10 ENCOUNTER — Emergency Department (HOSPITAL_COMMUNITY)
Admission: EM | Admit: 2013-07-10 | Discharge: 2013-07-10 | Disposition: A | Discharge status: Home / Self Care | Payer: BC Managed Care – PPO | Attending: Emergency Medicine | Admitting: Emergency Medicine

## 2013-07-10 ENCOUNTER — Encounter (HOSPITAL_COMMUNITY): Payer: Self-pay | Admitting: Emergency Medicine

## 2013-07-10 ENCOUNTER — Emergency Department (HOSPITAL_COMMUNITY): Payer: BC Managed Care – PPO

## 2013-07-10 DIAGNOSIS — F3289 Other specified depressive episodes: Secondary | ICD-10-CM | POA: Insufficient documentation

## 2013-07-10 DIAGNOSIS — K8010 Calculus of gallbladder with chronic cholecystitis without obstruction: Secondary | ICD-10-CM | POA: Insufficient documentation

## 2013-07-10 DIAGNOSIS — Z8669 Personal history of other diseases of the nervous system and sense organs: Secondary | ICD-10-CM | POA: Insufficient documentation

## 2013-07-10 DIAGNOSIS — Z8742 Personal history of other diseases of the female genital tract: Secondary | ICD-10-CM | POA: Insufficient documentation

## 2013-07-10 DIAGNOSIS — K819 Cholecystitis, unspecified: Secondary | ICD-10-CM

## 2013-07-10 DIAGNOSIS — F411 Generalized anxiety disorder: Secondary | ICD-10-CM | POA: Insufficient documentation

## 2013-07-10 DIAGNOSIS — Z79899 Other long term (current) drug therapy: Secondary | ICD-10-CM | POA: Insufficient documentation

## 2013-07-10 DIAGNOSIS — F329 Major depressive disorder, single episode, unspecified: Secondary | ICD-10-CM | POA: Insufficient documentation

## 2013-07-10 DIAGNOSIS — K8050 Calculus of bile duct without cholangitis or cholecystitis without obstruction: Secondary | ICD-10-CM

## 2013-07-10 DIAGNOSIS — Z3202 Encounter for pregnancy test, result negative: Secondary | ICD-10-CM | POA: Insufficient documentation

## 2013-07-10 DIAGNOSIS — Z9889 Other specified postprocedural states: Secondary | ICD-10-CM | POA: Insufficient documentation

## 2013-07-10 DIAGNOSIS — R509 Fever, unspecified: Secondary | ICD-10-CM | POA: Insufficient documentation

## 2013-07-10 LAB — URINE MICROSCOPIC-ADD ON

## 2013-07-10 LAB — COMPREHENSIVE METABOLIC PANEL
ALBUMIN: 3.8 g/dL (ref 3.5–5.2)
ALT: 18 U/L (ref 0–35)
AST: 16 U/L (ref 0–37)
Alkaline Phosphatase: 82 U/L (ref 39–117)
BILIRUBIN TOTAL: 0.3 mg/dL (ref 0.3–1.2)
BUN: 19 mg/dL (ref 6–23)
CO2: 26 mEq/L (ref 19–32)
CREATININE: 0.69 mg/dL (ref 0.50–1.10)
Calcium: 9.6 mg/dL (ref 8.4–10.5)
Chloride: 105 mEq/L (ref 96–112)
GFR calc Af Amer: >90 mL/min (ref >90)
Glucose, Bld: 105 mg/dL — ABNORMAL HIGH (ref 70–99)
Potassium: 4 mEq/L (ref 3.7–5.3)
Sodium: 143 mEq/L (ref 137–147)
Total Protein: 7 g/dL (ref 6.0–8.3)

## 2013-07-10 LAB — CBC WITH DIFFERENTIAL/PLATELET
Basophils Absolute: 0 10*3/uL (ref 0.0–0.1)
Basophils Relative: 0 % (ref 0–1)
EOS ABS: 0.1 10*3/uL (ref 0.0–0.7)
Eosinophils Relative: 1 % (ref 0–5)
HEMATOCRIT: 40.3 % (ref 36.0–46.0)
HEMOGLOBIN: 14 g/dL (ref 12.0–15.0)
Lymphocytes Relative: 30 % (ref 12–46)
Lymphs Abs: 2.5 10*3/uL (ref 0.7–4.0)
MCH: 32.9 pg (ref 26.0–34.0)
MCHC: 34.7 g/dL (ref 30.0–36.0)
MCV: 94.8 fL (ref 78.0–100.0)
MONO ABS: 0.4 10*3/uL (ref 0.1–1.0)
MONOS PCT: 5 % (ref 3–12)
Neutro Abs: 5.1 10*3/uL (ref 1.7–7.7)
Neutrophils Relative %: 63 % (ref 43–77)
Platelets: 269 10*3/uL (ref 150–400)
RBC: 4.25 MIL/uL (ref 3.87–5.11)
RDW: 12.8 % (ref 11.5–15.5)
WBC: 8.1 10*3/uL (ref 4.0–10.5)

## 2013-07-10 LAB — URINALYSIS, ROUTINE W REFLEX MICROSCOPIC
BILIRUBIN URINE: NEGATIVE
Glucose, UA: NEGATIVE mg/dL
Ketones, ur: NEGATIVE mg/dL
NITRITE: NEGATIVE
PH: 7 (ref 5.0–8.0)
Protein, ur: NEGATIVE mg/dL
SPECIFIC GRAVITY, URINE: 1.027 (ref 1.005–1.030)
UROBILINOGEN UA: 1 mg/dL (ref 0.0–1.0)

## 2013-07-10 LAB — LIPASE, BLOOD: LIPASE: 25 U/L (ref 11–59)

## 2013-07-10 LAB — PREGNANCY, URINE: PREG TEST UR: NEGATIVE

## 2013-07-10 MED ORDER — OXYCODONE-ACETAMINOPHEN 5-325 MG PO TABS
2.0000 | ORAL_TABLET | Freq: Once | ORAL | Status: AC
  Administered 2013-07-10: 2 via ORAL
  Filled 2013-07-10: qty 2

## 2013-07-10 MED ORDER — ONDANSETRON 8 MG PO TBDP
8.0000 mg | ORAL_TABLET | Freq: Once | ORAL | Status: AC
  Administered 2013-07-10: 8 mg via ORAL
  Filled 2013-07-10: qty 1

## 2013-07-10 MED ORDER — ONDANSETRON 8 MG PO TBDP: 8.0000 mg | ORAL_TABLET | Freq: Three times a day (TID) | ORAL | Status: DC | PRN

## 2013-07-10 MED ORDER — OXYCODONE-ACETAMINOPHEN 5-325 MG PO TABS: 2.0000 | ORAL_TABLET | ORAL | Status: DC | PRN

## 2013-07-10 NOTE — ED Notes (Signed)
Pt reports R side ab pain that stared Tuesday that goes up to her R side back up to scapula. Pt report n/vx2. Pt report having gastric bypass Oct 2014. Pt alert and ambulatory in triage.

## 2013-07-10 NOTE — Discharge Instructions (Signed)
Gallbladder  Take medications as prescribed.  Call the surgery clinic later today to schedule a follow up visit for your gallstones.  Return to the ER for worsening pain, vomiting despite medications, fever, or other new concerning symptoms.   PAIN ACETAMINOPHEN OXYCODONE  PAIN ACETAMINOPHEN OXYCODONE: You have been given a medication that contains acetaminophen and oxycodone.      This medication is used to relieve pain.     DO NOT take this medication if you have liver disease or drink alcohol on a daily basis.     DO NOT take this medication if you are taking other over-the-counter medications that contain Tylenol or acetaminophen (the active ingredient in Tylenol).     If you have side-effects that you think are caused by this medicine, tell your doctor.     DO NOT drink alcoholic beverages while taking this medicine.     If you become dizzy, sit or lie down at the first signs.  You should be careful going up and down stairs.     If you are pregnant or breastfeeding, notify your doctor before taking this medication.     Keep this medication out of the reach of children.  Always keep this medication in child-proof containers.  DO NOT give your medication to anyone else. This medication can be HABIT-FORMING.  Discontinue use when no longer needed and never give this medication to others.  You have been given a medication, or a prescription for a medication, that causes drowsiness or dizziness.  DO NOT drive a car, operate machinery, or perform jobs that require you to be alert until you know how you are going to react to this medicine.  THESE INSTRUCTIONS ARE NOT COMPREHENSIVE (complete):  Ask your pharmacist for additional information and precautions for this medication.   GI ANTIEMETIC  GI ANTIEMETIC: You have been given a prescription for a medication for nausea and vomiting.      It is OK to take this medication if you are pregnant.  Be sure to tell your regular doctor or  obstetrician [facility] doctor) that you have been taking this medication.     Take this medication as directed.     If you are taking phenobarbital, narcotic pain medications, antidepressants, or sleeping pills your dosage may need to be adjusted.  Be sure to inform your doctor of all the other medications that you are taking.     DO NOT take this medication if you have liver disease or heart disease.     DO NOT take pain killers (narcotic medication) unless specifically instructed to do so by your doctor     DO NOT drink alcoholic beverages while taking this medicine.     If you develop any reactions that you believe may be from the medication be sure to tell your doctor or return to the ER (Some reactions may include:  dizziness, shaking, visual disturbances, nervousness, fainting, rash).     If you become dizzy, sit or lie down at the first signs.  You should be careful going up and down stairs.     Keep this medication out of the reach of children.  Always keep this medication in child-proof containers.  DO NOT give your medication to anyone else. You have been given a medication, or a prescription for a medication, that causes drowsiness or dizziness.  DO NOT drive a car, operate machinery, ride a bike, or perform jobs that require you to be alert until you know how you  are going to react to this medication. ° °THESE INSTRUCTIONS ARE NOT COMPREHENSIVE (complete):  Ask your pharmacist for additional information and precautions for this medication. ° ° °LOW FAT DIET ° °Low Fat Diet ° °Your doctor wants you to be on a low fat diet.  This diet will be helpful if you want to lose weight or if you have problems with your liver, pancreas, gallbladder.  °FOODS ALLOWED: ° ·  Beverages: All, except those not allowed.   Evaporated skim milk.  ° ·  Breads: All enriched or whole grain bread, bread sticks, graham crackers, melba toast, pretzels, rye wafers, matzoh, saltines, bagels.  ° ·  Cereals: All cooked  without fat or dry.  ° ·  Desserts: All fruit, diet puddings, gelatin, dessert made with egg white, angel food cake, fruit ice, sherbet.  ° ·  Eggs: Not more than one egg yolk daily, whites okay.  Cholesterol free egg substitutes, such as "egg beaters."  ° ·  Fat: One teaspoon each meal or 3 teaspoons per day; butter, mayonnaise, margarine, oil. (May be used in cooking if omitted at meals) Fat free salad dressings and gravy.  ° ·  Fruits: All fresh, frozen, or canned fruit or fruit juice. One citrus fruit every day.  ° ·  Meats, Fish, Poultry & Cheese: Remove visible fat from meat before cooking. Baked, broiled, boiled, roasted, stewed, simmered; lean fish, meat, poultry, seafood.  Water packed salmon and tuna.  Lowfat cottage cheese, skim milk cheese, ricotta, parmesan, farmer's cheese, lowfat yogurt and tofu.  ° ·  Potatoes & Substitutes: Macaroni, noodles, rice, spaghetti, sweet or white potato; prepared without fat, unless used in amount allowed.  ° ·  Soups: Bouillon, cream soups made with vegetables and skim milk, fat free meat and poultry soups.  ° ·  Sweets: Honey, jam, jelly, marshmallows, molasses, sugar, syrup, candies; hard, Life Savers, gum drops, jelly beans, sour balls.  ° ·  Vegetables: All fresh, frozen, canned or juiced vegetables allowed.  °FOODS NOT ALLOWED: ° ·  Beverages: Cream, 2% or whole milk, chocolate milk, condensed milk, evaporated or malted milk or shakes, 1/2 & 1/2.  ° ·  Breads: Quick breads, muffins, biscuits, pancakes, corn bread, sweet rolls, any fried breads.  ° ·  Cereals: Bran, if it causes distress, wheat germ.  ° ·  Desserts: Dessert made with whole milk, cream, butter, lard, oil, coconut, nuts, or chocolate.  ° ·  Eggs: Eggs prepared with whole milk or fat. Fried eggs.  ° ·  Fat: More than 1 teaspoon per meal.  Bacon, bacon fat, ham fat, lard, salt pork, shortening, gravy, salad dressings, non-dairy creamers.  ° ·  Fruits: [place].  Any fruit that causes distress.   ° ·  Meats, Fish, Poultry & Cheese: All fried or fatty.  Sausage, prime rib, frankfurters, luncheon meats, fish canned in oil, duck, goose, poultry skin, spiced or pickled meats, cheese (except those allowed), whole milk yogurt.  Peanut butter limited to 1 Tbs. day.  ° ·  Potatoes & Substitutes: Cooked with fat or oil, fried potatoes, potato chips, cream sauces (unless made with skim milk).  ° ·  Sweets: Chocolate, coconut, nuts, caramels.  °          ·    Vegetables: Avocado, any cooked in fat or that cause distress.  ° ° °BILIARY COLIC ° °BILIARY COLIC: °You have been diagnosed with biliary colic. ° °Biliary colic is the term used to describe crampy pain from   a gallbladder that contains gallstones.  The gallbladder is a small sack that hangs from the liver. It stores a liquid called bile. Bile is produced by the liver. When you eat, the gallbladder squeezes bile through a duct or tube into the intestines to help with the digestion of fat.  Gallstones develop from crystals of bile. The stone may be smaller than a pea, or as large as a golf ball. There may be one or several stones. The stone may block the tube that drains the bile from the gallbladder. This blockage causes spasm of the gallbladder and pain.  The pain usually comes and goes and is crampy.  It often starts after eating foods that contain a lot of fat.  You may also have nausea and vomiting with the pain.  Biliary colic is usually treated with pain medications. You may also be given a medication for the nausea and vomiting.  You should avoid eating foods that are fried or contain a lot of fat.  If you have more episodes of pain, you may need to have your gall bladder removed.      You should contact your family doctor for a referral to a general surgeon. YOU SHOULD SEEK MEDICAL ATTENTION IMMEDIATELY, EITHER HERE OR AT THE NEAREST EMERGENCY DEPARTMENT, IF ANY OF THE FOLLOWING OCCURS:      Increasing pain or pain that does not go  away.     Persistent vomiting or if you are not able to keep any fluids down.     Fever or shaking chills.     Yellowing of your skin or eyes, or dark, brown-colored urine.  If you develop symptoms of Shortness of Breath, Chest Pain, Swelling of lips, mouth or tongue or if your condition becomes worse with any new symptoms, see your doctor or return to the Emergency Department for immediate care. Emergency services are not intended to be a substitute for comprehensive medical attention.  Please contact your doctor for follow up if not improving as expected.   Call your doctor in 5-7 days or as directed if there is no improvement.    Cholelithiasis Cholelithiasis (also called gallstones) is a form of gallbladder disease in which gallstones form in your gallbladder. The gallbladder is an organ that stores bile made in the liver, which helps digest fats. Gallstones begin as small crystals and slowly grow into stones. Gallstone pain occurs when the gallbladder spasms and a gallstone is blocking the duct. Pain can also occur when a stone passes out of the duct.  RISK FACTORS  Being female.   Having multiple pregnancies. Health care providers sometimes advise removing diseased gallbladders before future pregnancies.   Being obese.  Eating a diet heavy in fried foods and fat.   Being older than 60 years and increasing age.   Prolonged use of medicines containing female hormones.   Having diabetes mellitus.   Rapidly losing weight.   Having a family history of gallstones (heredity).  SYMPTOMS  Nausea.   Vomiting.  Abdominal pain.   Yellowing of the skin (jaundice).   Sudden pain. It may persist from several minutes to several hours.  Fever.   Tenderness to the touch. In some cases, when gallstones do not move into the bile duct, people have no pain or symptoms. These are called "silent" gallstones.  TREATMENT Silent gallstones do not need treatment. In severe  cases, emergency surgery may be required. Options for treatment include:  Surgery to remove the gallbladder. This is the  most common treatment.  Medicines. These do not always work and may take 6 12 months or more to work.  Shock wave treatment (extracorporeal biliary lithotripsy). In this treatment an ultrasound machine sends shock waves to the gallbladder to break gallstones into smaller pieces that can pass into the intestines or be dissolved by medicine. HOME CARE INSTRUCTIONS   Only take over-the-counter or prescription medicines for pain, discomfort, or fever as directed by your health care provider.   Follow a low-fat diet until seen again by your health care provider. Fat causes the gallbladder to contract, which can result in pain.   Follow up with your health care provider as directed. Attacks are almost always recurrent and surgery is usually required for permanent treatment.  SEEK IMMEDIATE MEDICAL CARE IF:   Your pain increases and is not controlled by medicines.   You have a fever or persistent symptoms for more than 2 3 days.   You have a fever and your symptoms suddenly get worse.   You have persistent nausea and vomiting.  MAKE SURE YOU:   Understand these instructions.  Will watch your condition.  Will get help right away if you are not doing well or get worse. Document Released: 03/16/2005 Document Revised: 11/20/2012 Document Reviewed: 09/11/2012 [facility] Patient Information 2014 [employer] [place] [place].   Community Resources: *IF YOU ARE IN IMMEDIATE DANGER CALL 911!  Abuse/Neglect:  Family Services Crisis Hotline [facility]([place]): [phone](336) [phone] [facility] Against [facility]([place]): [phone](336) [phone]  After hours, holidays and weekends: [phone] National Domestic Violence Hotline: [phone](203) 108-6062  [facility]: [facility]: [address]: [phone](336) [phone]  Health Clinics:  [facility]): [phone](336)  [phone] Monday - Friday 8 AM - 9 PM, Saturday and Sunday 10 AM - 9 PM  [facility] [address]: [phone] Monday - Friday 8 AM - 5 PM  [facility]  [address]: [phone] Monday- Friday 8:30 AM - 5:30 PM, Sat 9 AM - 1 PM  24 HR [place] [facility] [address]: [phone] [facility] [address]: [phone] [facility] [address]: [phone]  24 HR [facility]: [address] [phone]  Cultures: If culture results are positive, we will notify you if a change in treatment is necessary.  LABORATORY TESTS:         If you had any labs drawn in the ED that have not resulted by the time you are discharged home, we will review these lab results and the treatment given to you.  If there is any further treatment or notification needed, we will contact you by phone, or letter.  "PLEASE ENSURE THAT YOU HAVE GIVEN US YOUR CURRENT WORKING PHONE NUMBER AND YOUR CURRENT ADDRESS, so that we can contact you if needed."  RADIOLOGY TESTS:  If the referred physician wants todays x-rays, please call the hospitals Radiology Department the day before your doctors appointment. [place]     [phone] [place]   [phone] [place]     [phone]  Our doctors and staff appreciate your choosing us for your emergency medical care needs. We are here to serve you.

## 2013-07-10 NOTE — ED Provider Notes (Signed)
CSN: [id number]     Arrival date & time 07/10/13  0124 History   First MD Initiated Contact with Patient 07/10/13 0355     Chief Complaint  Patient presents with  . Abdominal Pain     (Consider location/radiation/quality/duration/timing/severity/associated sxs/prior Treatment) HPI 23 year old female presents to emergency room from home with complaint of right upper abdominal pain starting on Tuesday.  She reports fever on Tuesday, up to 102, which resolved with one dose of Tylenol.  Pain started after having pizza.  Since that time.  Soon after eating.  She has return of pain.  Patient has history of Roux-en-Y surgery done October 2014 in [place].  She denies any persistent vomiting or change in bowel habits.  No midline abdominal pain.  Patient still has her gallbladder, has never had biliary colic in the past. Past Medical History  Diagnosis Date  . Endometriosis   . Adult ADHD   . Anxiety and depression   . Pseudotumor cerebri 2011  . H/O gastric bypass    Past Surgical History  Procedure Laterality Date  . Colonoscopy    . Lumbar puncture    . Laparoscopic endometriosis fulguration     Family History  Problem Relation Age of Onset  . Diabetes Mother   . Stroke Maternal Grandmother   . Heart disease Maternal Grandfather    History  Substance Use Topics  . Smoking status: Never Smoker   . Smokeless tobacco: Not on file  . Alcohol Use: No   OB History   Grav Para Term Preterm Abortions TAB SAB Ect Mult Living                 Review of Systems   See History of Present Illness; otherwise all other systems are reviewed and negative  Allergies  Corticosteroids; Gluten meal; Nsaids; and Sulfa antibiotics  Home Medications   Current Outpatient Rx  Name  Route  Sig  Dispense  Refill  . albuterol (PROVENTIL HFA;VENTOLIN HFA) 108 (90 BASE) MCG/ACT inhaler   Inhalation   Inhale 2 puffs into the lungs every 6 (six) hours as needed for wheezing.         Marland Kitchen ALPRAZolam  (XANAX) 1 MG tablet   Oral   Take 1 mg by mouth 3 (three) times daily as needed for anxiety.         . Calcium Citrate-Vitamin D (CALCIUM CITRATE + D3 PO)   Oral   Take 1 tablet by mouth daily. 500mg  calcium, not sure about vitamin d         . Cyanocobalamin (B-12 PO)   Oral   Take 500 mcg by mouth once a week. Nasal spray: For saturdays         . docusate sodium (COLACE) 100 MG capsule   Oral   Take 100 mg by mouth daily as needed for mild constipation.         . ferrous sulfate 325 (65 FE) MG EC tablet   Oral   Take 325 mg by mouth daily with breakfast.         . Multiple Vitamin (MULTIVITAMIN WITH MINERALS) TABS tablet   Oral   Take 2 tablets by mouth 2 (two) times daily.         . ondansetron (ZOFRAN) 8 MG tablet   Oral   Take 8 mg by mouth every 8 (eight) hours as needed for nausea.         . polyethylene glycol powder (GLYCOLAX/MIRALAX) powder  Oral   Take 17 g by mouth 2 (two) times daily. Until daily soft stools  OTC   255 g   0   . QUEtiapine (SEROQUEL) 200 MG tablet   Oral   Take 100 mg by mouth at bedtime.         Marland Kitchen. zolpidem (AMBIEN) 10 MG tablet   Oral   Take 5-10 mg by mouth at bedtime as needed for sleep.         . bisacodyl (DULCOLAX) 5 MG EC tablet   Oral   Take 5 mg by mouth daily as needed for moderate constipation.         Marland Kitchen. HYDROcodone-acetaminophen (NORCO) 5-325 MG per tablet   Oral   Take 1-2 tablets by mouth every 6 (six) hours as needed for severe pain.   20 tablet   0   . levonorgestrel (MIRENA) 20 MCG/24HR IUD   Intrauterine   1 each by Intrauterine route continuous.         . promethazine (PHENERGAN) 25 MG tablet   Oral   Take 25 mg by mouth every 6 (six) hours as needed for nausea or vomiting.          BP 108/67  Pulse 66  Temp(Src) 98.5 F (36.9 C) (Oral)  Resp 18  SpO2 98%  LMP 06/09/2013 Physical Exam  Nursing note and vitals reviewed. Constitutional: She is oriented to person, place, and  time. She appears well-developed and well-nourished. She appears distressed.  HENT:  Head: Normocephalic and atraumatic.  Nose: Nose normal.  Mouth/Throat: Oropharynx is clear and moist.  Eyes: Conjunctivae and EOM are normal. Pupils are equal, round, and reactive to light.  Neck: Normal range of motion. Neck supple. No JVD present. No tracheal deviation present. No thyromegaly present.  Cardiovascular: Normal rate, regular rhythm, normal heart sounds and intact distal pulses.  Exam reveals no gallop and no friction rub.   No murmur heard. Pulmonary/Chest: Effort normal and breath sounds normal. No stridor. No respiratory distress. She has no wheezes. She has no rales. She exhibits no tenderness.  Abdominal: Soft. Bowel sounds are normal. She exhibits no distension and no mass. There is tenderness (patient has significant tenderness in right upper quadrant with positive Murphy sign). There is no rebound and no guarding.  Laparoscopic incisions noted, well healed.  No hernias noted  Musculoskeletal: Normal range of motion. She exhibits no edema and no tenderness.  Lymphadenopathy:    She has no cervical adenopathy.  Neurological: She is alert and oriented to person, place, and time. She exhibits normal muscle tone. Coordination normal.  Skin: Skin is warm and dry. No rash noted. No erythema. No pallor.  Psychiatric: She has a normal mood and affect. Her behavior is normal. Judgment and thought content normal.    ED Course  Procedures (including critical care time) Labs Review Labs Reviewed  COMPREHENSIVE METABOLIC PANEL - Abnormal; Notable for the following:    Glucose, Bld 105 (*)    All other components within normal limits  URINALYSIS, ROUTINE W REFLEX MICROSCOPIC - Abnormal; Notable for the following:    APPearance CLOUDY (*)    Hgb urine dipstick TRACE (*)    Leukocytes, UA TRACE (*)    All other components within normal limits  CBC WITH DIFFERENTIAL  LIPASE, BLOOD  PREGNANCY,  URINE  URINE MICROSCOPIC-ADD ON   Imaging Review [place] Abdomen Complete  07/10/2013   CLINICAL DATA:  Right upper quadrant pain.  EXAM: ULTRASOUND ABDOMEN COMPLETE  COMPARISON:  None.  FINDINGS: Gallbladder:  4 mm mobile echogenic structure of the gallbladder lumen is most consistent with a gallstone, despite lack of demonstrable shadowing. No wall thickening or sonographic Murphy sign.  Common bile duct:  Diameter: 4 mm  Liver:  No focal lesion identified. Within normal limits in parenchymal echogenicity.  IVC:  No abnormality visualized.  Pancreas:  Visualized portion unremarkable.  Spleen:  Size and appearance within normal limits.  Right Kidney:  Length: 12 cm. Echogenicity within normal limits. No mass or hydronephrosis visualized.  Left Kidney:  Length: 13 cm. Echogenicity within normal limits. No mass or hydronephrosis visualized.  Abdominal aorta:  No aneurysm visualized.  Other findings:  None.  IMPRESSION: Small, non-shadowing gallstone.  Negative for acute cholecystitis.   Electronically Signed   By: Tiburcio Pea M.D.   On: 07/10/2013 06:48     EKG Interpretation None      MDM   Final diagnoses:  Cholecystitis  Biliary colic    23 year old female with what sounds to be biliary colic.  Labs are normal.  Plan for right upper quadrant ultrasound for further evaluation.    Olivia Mackie, MD 07/10/13 [phone]

## 2013-07-28 ENCOUNTER — Ambulatory Visit (INDEPENDENT_AMBULATORY_CARE_PROVIDER_SITE_OTHER): Payer: BC Managed Care – PPO | Admitting: Surgery

## 2014-03-19 ENCOUNTER — Ambulatory Visit (INDEPENDENT_AMBULATORY_CARE_PROVIDER_SITE_OTHER): Payer: BC Managed Care – PPO | Admitting: Family Medicine

## 2014-03-19 VITALS — BP 142/82 | HR 116 | Temp 97.6°F | Resp 12 | Ht 68.0 in | Wt 140.4 lb

## 2014-03-19 DIAGNOSIS — F429 Obsessive-compulsive disorder, unspecified: Secondary | ICD-10-CM

## 2014-03-19 DIAGNOSIS — F411 Generalized anxiety disorder: Secondary | ICD-10-CM

## 2014-03-19 DIAGNOSIS — Z9884 Bariatric surgery status: Secondary | ICD-10-CM

## 2014-03-19 DIAGNOSIS — R1110 Vomiting, unspecified: Secondary | ICD-10-CM

## 2014-03-19 DIAGNOSIS — F42 Obsessive-compulsive disorder: Secondary | ICD-10-CM

## 2014-03-19 DIAGNOSIS — F5000 Anorexia nervosa, unspecified: Secondary | ICD-10-CM

## 2014-03-19 LAB — POCT URINALYSIS DIPSTICK
Blood, UA: NEGATIVE
Glucose, UA: NEGATIVE
KETONES UA: 15
Leukocytes, UA: NEGATIVE
NITRITE UA: NEGATIVE
Protein, UA: NEGATIVE
Spec Grav, UA: 1.025
Urobilinogen, UA: 4
pH, UA: 6

## 2014-03-19 MED ORDER — FLUOXETINE HCL 20 MG PO TABS: 20.0000 mg | ORAL_TABLET | Freq: Every day | ORAL | Status: DC

## 2014-03-19 MED ORDER — CLONAZEPAM 0.5 MG PO TABS: 0.2500 mg | ORAL_TABLET | Freq: Two times a day (BID) | ORAL | Status: DC | PRN

## 2014-03-19 NOTE — Progress Notes (Signed)
Patient ID: Sarah Estes, female   DOB: Jul 20, 1990, 23 y.o.   MRN: [id number]     Subjective:    Patient ID: Sarah Estes, female    DOB: [birth year], 23 y.o.   MRN: [id number]  03/19/2014  This chart was scribed for Ethelda Chick, MD by Ronney Lion, ED Scribe. This patient was seen in room 10 and the patient's care was started at 8:55 PM.   Anorexia   HPI   HPI Comments: Sarah Estes is a 23 y.o. female who presents to the [facility] referred by Noni Saupe, her therapist, who diagnosed patient with anorexia today and wanted her to get full a blood-work and an EKG done.  She had originally seen a nutritional therapist a week ago because she has lately felt like she doesn't like food and would like someone else to help her handle her food for her.   Patient had a gastric bypass done at [facility] 01/08/2013. In May 2014, patient weighed 308 pounds. Shortly after, she measured her weight to be 268 and 262. Patient had lost about 40-50 pounds before surgery. At the end of December 2014, patient was at 199 pounds. In April 2015, patient was 182 pounds. In June and August, about 173. Since August, patient has dropped to 140 pounds in the past 4 months. She last had a f/u visit with her surgeon in July.   Patient says that she started restricting her diet from April of this year onwards. She started by avoiding processed foods and added sugar, and "wouldn't touch food if it didn't grow from the ground." Patient will not eat anything that isn't USDA certified organic because she wants to avoid GMOs. She had an endocrinologist tell her that she has gluten sensitivity, so she started to eat gluten-free in January 2014. She says that she started to feel much better. Patient says that she doesn't like to eat meat after her gastric bypass surgery. A banana and 2 tbsp of peanut butter would be an example of lunch. She says that she eats like a vegan, although she  doesn't identify herself as one.   She hates food, and she says that within the last 6-8 months, she feels like she wouldn't eat if she didn't have to eat to live. Patient hasn't eaten anything in the past 4-5 days, and she doesn't know why. She will chew a pack of gum a day or have hard mints. She feels like she doesn't get hungry, and she also has been busy. She has hated eating in front of people for a long time, because they will make comments about how much she eats, or similar statements. She drinks about a 16 ounce bottle of water a day, and a half a coffee cup a day. She states that she doesn't know how to deal with her current symptoms or what to do next.   Her PCP gave her Zofran to encourage eating because it was thought her symptoms might be due post-bypass symptoms. Any kind of food makes her feel nauseated, and for 1-2 hours after food ingestion, she'll have involuntary "baby barfs" . She induces vomiting sometimes because it makes her feel better. Patient also mentions that a pumpkin spice latte from [employer] once made her feel sweaty and sick to her stomach.   Patient also states that her fasting episodes started since before August 2015. Her fasting patterns happen when she meets a new person or goes to a family event.  Patient has a lot of anxiety. She can't stand being in places with a lot of people. She says that if she is at the grocery store and sees a person down the aisle, she will avoid walking down the aisle.   Patient is enrolled in an accelerated master's program in nutrition. 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 She lives with her parents and a 21 year old brother and 21 year old brother at home. Her father is a Programmer, multimediapreacher and her mother is a Runner, broadcasting/film/videoteacher. Patient has heard the message from her mom growing up that she should suppress any emotional or personal problems. Only her parents and grandparents know that she has had a gastric bypass;  she told everyone else that she has brain surgery done.  She underwent gastric bypass due to pseudotumor cerebri.  She chose to have surgery on her stomach to help with weight loss instead of having surgery on her brain.  Patient weighs herself about 4 times a day, sometimes more. She goes to the gym at least 6 times a week, with 60-90 minutes of cardio and 45 minutes of weights. She will spend about 2-3 hours total at the gym, multitasking by reading a textbook. She enjoys gym as a stress reliever. Her therapist has told her that she needs to cut back on gym activity.   Patient takes prescription vitamins, including a Vitamin B-12 injection, 50,000 IU Vitamin D. Patient has a history of depression and anxiety that was once controlled but seems to have returned. She took Xanax and Celexa, which didn't work, in the past. She denies SI. Patient also has endometriosis and pseudotumor cerebri. Her ophthalmologist has been monitoring her cerebri; she denies seeing a neurologist.  Patient had a single episode of syncope 1-2 weeks ago when she stood up in order to get ready for work. She denies picking at her skin but acknowledges various skin lesions on face and arms. She has 1 BM a week, which she will induce one with Miralax or another laxative if she hasn't gone in 5 days. Patient stopped having periods about July-August of this year. She has an IUD.  Patient has also noticed that she will have bruises that last a long time, that gets cold easily, and that she feels occasional numbness and tingling in her extremities. A doctor thought she might have DM.    Review of Systems  Constitutional: Positive for chills, appetite change, fatigue and unexpected weight change. Negative for fever and diaphoresis.  Respiratory: Negative for shortness of breath.   Cardiovascular: Negative for chest pain, palpitations and leg swelling.  Gastrointestinal: Positive for vomiting ("baby barfs"). Negative for nausea,  abdominal pain and diarrhea.  Endocrine: Positive for cold intolerance. Negative for heat intolerance, polydipsia, polyphagia and polyuria.  Neurological: Positive for syncope and numbness (and tingling in her extremities). Negative for dizziness, tremors, seizures, facial asymmetry, speech difficulty, weakness, light-headedness and headaches.  Psychiatric/Behavioral: Negative for suicidal ideas, sleep disturbance, self-injury and dysphoric mood. The patient is nervous/anxious.     Past Medical History  Diagnosis Date  . Endometriosis   . Adult ADHD   . Anxiety and depression   . Pseudotumor cerebri 2011  . H/O gastric bypass   . Allergy   . Anxiety   . Asthma   . Depression   . Anorexia    Past Surgical History  Procedure Laterality Date  . Colonoscopy    . Lumbar puncture    .  Laparoscopic endometriosis fulguration    . Cosmetic surgery    . Gastric bypass     Allergies  Allergen Reactions  . Corticosteroids Other (See Comments)    Injections cannot take due to gastric surgery (creams or tablets?)  . Gluten Meal Nausea Only and Other (See Comments)    Exacerbates endometriosis symptoms    . Nsaids Other (See Comments)    Had gastric bypass surgery   . Sulfa Antibiotics Hives, Itching and Rash  . Morphine And Related Itching   Current Outpatient Prescriptions  Medication Sig Dispense Refill  . bisacodyl (DULCOLAX) 5 MG EC tablet Take 5 mg by mouth daily as needed for moderate constipation.    . docusate sodium (COLACE) 100 MG capsule Take 100 mg by mouth daily as needed for mild constipation.    Marland Kitchen. levonorgestrel (MIRENA) 20 MCG/24HR IUD 1 each by Intrauterine route continuous.    . polyethylene glycol powder (GLYCOLAX/MIRALAX) powder Take 17 g by mouth 2 (two) times daily. Until daily soft stools  OTC (Patient not taking: Reported on 03/31/2014) 255 g 0  . amphetamine-dextroamphetamine (ADDERALL) 20 MG tablet Take 20 mg by mouth 2 (two) times daily.    . clonazePAM  (KLONOPIN) 1 MG tablet Take 1 tablet (1 mg total) by mouth every 4 (four) hours as needed for anxiety. 60 tablet 0  . cyanocobalamin (,VITAMIN B-12,) 1000 MCG/ML injection Inject 1,000 mcg into the muscle every 30 (thirty) days.    Marland Kitchen. FLUoxetine (PROZAC) 40 MG capsule Take 1 capsule (40 mg total) by mouth daily. 30 capsule 0  . hydrOXYzine (VISTARIL) 25 MG capsule Take 1 capsule (25 mg total) by mouth at bedtime as needed for anxiety. 30 capsule 0  . Vitamin D, Ergocalciferol, (DRISDOL) 50000 UNITS CAPS capsule Take 100,000 Units by mouth every 7 (seven) days.     No current facility-administered medications for this visit.       Objective:    BP 142/82 mmHg  Pulse 116  Temp(Src) 97.6 F (36.4 C) (Oral)  Resp 12  Ht 5\' 8"  (1.727 m)  Wt 140 lb 6 oz (63.674 kg)  BMI 21.35 kg/m2  SpO2 99% Physical Exam  Constitutional: She is oriented to person, place, and time. She appears well-developed. No distress.  HENT:  Head: Normocephalic and atraumatic.  Right Ear: External ear normal.  Left Ear: External ear normal.  Mouth/Throat: Oropharynx is clear and moist.  Eyes: Conjunctivae and EOM are normal. Pupils are equal, round, and reactive to light.  Neck: Normal range of motion. Neck supple. No tracheal deviation present. No thyromegaly present.  Cardiovascular: Normal rate, regular rhythm and normal heart sounds.  Exam reveals no gallop and no friction rub.   No murmur heard. Pulmonary/Chest: Effort normal. No respiratory distress. She has no wheezes. She has no rales.  Abdominal: Soft. Bowel sounds are normal. She exhibits no distension and no mass. There is no tenderness. There is no rebound and no guarding.  Musculoskeletal: Normal range of motion.  Lymphadenopathy:    She has no cervical adenopathy.  Neurological: She is alert and oriented to person, place, and time. No cranial nerve deficit. She exhibits normal muscle tone. Coordination normal.  Skin: Skin is warm and dry. She is not  diaphoretic.  Well-healed incisions on abdomen. Had excoriations scattered on arms and face.  Psychiatric: She has a normal mood and affect. Her behavior is normal. Judgment and thought content normal.  Nursing note and vitals reviewed.  EKG: NSR; rate 79; no  arrhythmia.     Assessment & Plan:   1. Anorexia nervosa   2. S/P gastric bypass   3. Generalized anxiety disorder   4. OCD (obsessive compulsive disorder)   5. Regurgitation of food     1. Anorexia nervosa:  New.  Onset in the past 3-6 months following gastric bypass.  Obtain labs; EKG with NSR;no arrhythmia present; no bradycardia.  Continue with weekly counseling/psychotherapy. Initiate Prozac 20mg  daily; close follow-up in 1-2 weeks at [facility] with myself, Benny Lennert, or Porfirio Oar.  Advised pt to eat daily. 2.  S/p gastric bypass: surgery 01/2013. Now with extreme restrictive pattern and prolonged periods of fasting.  Very concerning for malnutrition.  Suffering with post-prandial regurgitation which may be partially psychogenic but need to rule out mechanical pathology.  Advised pt to call surgeon for follow-up appointment immediately.  May also warrant GI referral for EGD if surgeon does not perform imaging study. 3.  Generalized anxiety disorder:  Uncontrolled/new.  Start Prozac 20mg   1/2 tablet daily and then increase to one tablet daily and will warrant up-titration in future.  Rx for Clonazepam 0.5mg  bid scheduled for now and then will transition to PRN once stable.  Continue with weekly psychotherapy.  Close follow-up in 1-2 weeks at [facility].  Advised to call immediately if not tolerating medications. 4.  OCD: uncontrolled; start Prozac and Clonazepam. 5. Regurgitation of food:  New.  Obtain labs; warrants follow-up with surgeon for further evaluation.  May also warrant GI referral.  Start with general surgery evaluation/follow-up.     Meds ordered this encounter  Medications  . DISCONTD: clonazePAM (KLONOPIN) 0.5 MG  tablet    Sig: Take 0.5-1 tablets (0.25-0.5 mg total) by mouth 2 (two) times daily as needed for anxiety.    Dispense:  45 tablet    Refill:  0  . DISCONTD: FLUoxetine (PROZAC) 20 MG tablet    Sig: Take 1 tablet (20 mg total) by mouth daily.    Dispense:  30 tablet    Refill:  3    No Follow-up on file.    I personally performed the services described in this documentation, which was scribed in my presence. The recorded information has been reviewed and considered.   Kristi Paulita Fujita, M.D. [facility] [address] [place], [place]  [address] [phone] phone [phone] fax

## 2014-03-19 NOTE — Patient Instructions (Signed)
1.  Follow-up in two weeks.   2. Call if you have any problems with medication. 3.  Start with Prozac 20mg   1/2 tablet daily for one week and then increase to 1 tablet daily. 4.  Start Clonazepam 0.5mg    1/2-1 tablet twice daily scheduled.   5.  Please see:  Benny LennertSarah Weber, PA-C,  Porfirio Oarhelle Jeffery, PA-C, or Nilda SimmerKristi Alvar Malinoski, MD.

## 2014-03-20 LAB — CBC WITH DIFFERENTIAL/PLATELET
Basophils Absolute: 0 10*3/uL (ref 0.0–0.1)
Basophils Relative: 0 % (ref 0–1)
EOS ABS: 0.1 10*3/uL (ref 0.0–0.7)
EOS PCT: 1 % (ref 0–5)
HCT: 42.3 % (ref 36.0–46.0)
HEMOGLOBIN: 15 g/dL (ref 12.0–15.0)
LYMPHS ABS: 2.6 10*3/uL (ref 0.7–4.0)
Lymphocytes Relative: 38 % (ref 12–46)
MCH: 33 pg (ref 26.0–34.0)
MCHC: 35.5 g/dL (ref 30.0–36.0)
MCV: 93.2 fL (ref 78.0–100.0)
MONO ABS: 0.3 10*3/uL (ref 0.1–1.0)
MPV: 10.3 fL (ref 9.4–12.4)
Monocytes Relative: 4 % (ref 3–12)
Neutro Abs: 3.9 10*3/uL (ref 1.7–7.7)
Neutrophils Relative %: 57 % (ref 43–77)
Platelets: 287 10*3/uL (ref 150–400)
RBC: 4.54 MIL/uL (ref 3.87–5.11)
RDW: 12.9 % (ref 11.5–15.5)
WBC: 6.8 10*3/uL (ref 4.0–10.5)

## 2014-03-20 LAB — COMPREHENSIVE METABOLIC PANEL
ALT: 11 U/L (ref 0–35)
AST: 15 U/L (ref 0–37)
Albumin: 4.3 g/dL (ref 3.5–5.2)
Alkaline Phosphatase: 74 U/L (ref 39–117)
BUN: 11 mg/dL (ref 6–23)
CALCIUM: 9.9 mg/dL (ref 8.4–10.5)
CHLORIDE: 103 meq/L (ref 96–112)
CO2: 24 mEq/L (ref 19–32)
CREATININE: 0.76 mg/dL (ref 0.50–1.10)
GLUCOSE: 70 mg/dL (ref 70–99)
Potassium: 4.3 mEq/L (ref 3.5–5.3)
Sodium: 139 mEq/L (ref 135–145)
Total Bilirubin: 1.2 mg/dL (ref 0.2–1.2)
Total Protein: 6.8 g/dL (ref 6.0–8.3)

## 2014-03-20 LAB — IRON: Iron: 117 ug/dL (ref 42–145)

## 2014-03-20 LAB — VITAMIN B12: VITAMIN B 12: 537 pg/mL (ref 211–911)

## 2014-03-20 LAB — TSH: TSH: 1.38 u[IU]/mL (ref 0.350–4.500)

## 2014-03-20 LAB — T4, FREE: Free T4: 1.16 ng/dL (ref 0.80–1.80)

## 2014-03-20 LAB — VITAMIN D 25 HYDROXY (VIT D DEFICIENCY, FRACTURES): Vit D, 25-Hydroxy: 26 ng/mL — ABNORMAL LOW (ref 30–100)

## 2014-03-23 ENCOUNTER — Telehealth: Payer: Self-pay

## 2014-03-23 NOTE — Telephone Encounter (Signed)
Eugene GaviaLaura King called from pt's nutritionist office requesting records from pt's recent visit with us. She is on pt's HIPAA form. Faxed labs and EKG to [phone]

## 2014-03-24 ENCOUNTER — Ambulatory Visit (INDEPENDENT_AMBULATORY_CARE_PROVIDER_SITE_OTHER): Payer: BC Managed Care – PPO | Admitting: Physician Assistant

## 2014-03-24 VITALS — BP 110/74 | HR 99 | Temp 98.1°F | Resp 16 | Ht 69.0 in | Wt 140.0 lb

## 2014-03-24 DIAGNOSIS — F909 Attention-deficit hyperactivity disorder, unspecified type: Secondary | ICD-10-CM

## 2014-03-24 DIAGNOSIS — F411 Generalized anxiety disorder: Secondary | ICD-10-CM

## 2014-03-24 DIAGNOSIS — Z9884 Bariatric surgery status: Secondary | ICD-10-CM

## 2014-03-24 DIAGNOSIS — F988 Other specified behavioral and emotional disorders with onset usually occurring in childhood and adolescence: Secondary | ICD-10-CM

## 2014-03-24 DIAGNOSIS — G4700 Insomnia, unspecified: Secondary | ICD-10-CM

## 2014-03-24 DIAGNOSIS — F5001 Anorexia nervosa, restricting type: Secondary | ICD-10-CM

## 2014-03-24 MED ORDER — HYDROXYZINE PAMOATE 25 MG PO CAPS: 25.0000 mg | ORAL_CAPSULE | Freq: Every evening | ORAL | Status: DC | PRN

## 2014-03-24 NOTE — Patient Instructions (Signed)
Start Atarax at night to help with insomnia.  Increase your klonopin to 1 pill every 6h prn but not at bedtime.

## 2014-03-24 NOTE — Progress Notes (Signed)
Subjective:    Patient ID: Sarah Estes, female    DOB: 19-Oct-1990, 23 y.o.   MRN: [id number]  HPI Pt presents to clinic for her weekly evaluation and check-in.  She has just been to her therapist.  She is trying to understand her disease.  Berneice GandyLori King - nutritional therapist Kara DiesHeather Kitchens - therapist  Both of the above would like her to consider inpatient care due to her severe disease state.  She would like to not do this for now and continue to do what she is doing now which is see them weekly and see me weekly.    Adderall - be on for several years - for school and work -  Takes up to 2x/day for focus - concerned about not having this.  Still very anxious - Klonopin is helping she is taking 1 pill about 2x/day but feels like it only takes about 4-5 hours and then she starts to feel the anxiety return.  She is tolerating the Paxil well.  She at a cheese stick on Sunday and was able to keep it down but she has not really eaten anything since.  Her goal is a 32oz of water and an orange a day but she is not sure she can drink that much water.  She is currently not exercising at the request of her therapists.  Mom is with her today and has been taking her to appts.  No one is family really knows what is going on - they have kept her surgery a secret from even her siblings.  They are not sharing her current disease state - she is worried about the stigma of her disease and how people will treat her differently.  The book - Why she feels fat - she plans to read this to help her understand her disease.   Referral to [facility] - her gastric surgeon stated that her weight should be 125lbs as her goal. Mother is concerned that this goal was not correct and they would like a 2nd opinion about a good goal weight for her after surgery.  They have not been back for recheck and do not plan on going back.  Pt has a notebook that she is keeping notes in from all her appts.  She refers to this book  throughout the appointment.  She talks about liking to control things and researching everything before she does anything.  In fact she researched me before she was willing to see me even after her therapist told her to see me.  She thinks about and feels like she feels out of control and she wants to have the control back.  Review of Systems     Objective:   Physical Exam  Constitutional: She is oriented to person, place, and time.  BP 110/74 mmHg  Pulse 99  Temp(Src) 98.1 F (36.7 C)  Resp 16  Ht 5\' 9"  (1.753 m)  Wt 140 lb (63.504 kg)  BMI 20.67 kg/m2  SpO2 100%   HENT:  Head: Normocephalic and atraumatic.  Right Ear: External ear normal.  Left Ear: External ear normal.  Pulmonary/Chest: Effort normal.  Neurological: She is alert and oriented to person, place, and time.  Skin: Skin is warm and dry.  Psychiatric: She has a normal mood and affect. Her behavior is normal. Judgment and thought content normal.       Assessment & Plan:  Insomnia - Pt is not sleeping well even on the Klonopin - she states  that it relaxes her but does not allow her to sleep.  She is aware of addictive potential for Klonopin and since it is not working that well she would like to try something else.  She has been on Trazodone in the past and it has not helped. Plan: hydrOXYzine (VISTARIL) 25 MG capsule -   Anorexia nervosa, restricting type - This is a very difficult patient due to her past h/o obesity with stress eating and now a switch to food restriction and an absolute disgust regarding food.  Today her weight is stable.  She has a goal to drink 8 oz of water and increase it by 4 oz daily and to eat an orange a day.  She is currently not exercising.  S/P gastric bypass - plan to have her evaluated but a local surgeon - to make sure she is ok 1 year out from surgery.  It would also be nice to know what they think her goal weight should be and what they feel about the drastic weight reduction in regards  to a surgery prospective.  I believe that 125lbs for the patient is to low and if there give her a different range we will be helped on a mental standpoint - she feels like she has failed her gastric bypass because she is not at 125lb like the surgeon told her her goal should be after the surgery.  GAD (generalized anxiety disorder) - we will continue the Prozac - she is tolerating well.  I expect to increase to dose in the next 2 weeks as long as she is tolerating it due to her severe disease.  We has permission to increase the Klonopin to q6h throughout the day in hopes that we can decrease her anxiety overall and therefore decrease the anxiety she is feeling around food.  ADD - for now I do not think that it is a good idea for her to be on Adderall due to its appetite suppression.  We discussed maybe trying a non-stimulant treatment.  Maybe Intuniv or Wellbutrin due to her anxiety.  For now she will not take anything - she is currently on school break.  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    F/u in 1 week.  Benny LennertSarah Lameeka Schleifer PA-C  [facility] 03/26/2014 9:42 AM

## 2014-03-26 ENCOUNTER — Encounter: Payer: Self-pay | Admitting: Physician Assistant

## 2014-03-26 DIAGNOSIS — F5000 Anorexia nervosa, unspecified: Secondary | ICD-10-CM | POA: Insufficient documentation

## 2014-03-26 DIAGNOSIS — Z9884 Bariatric surgery status: Secondary | ICD-10-CM | POA: Insufficient documentation

## 2014-03-26 DIAGNOSIS — F411 Generalized anxiety disorder: Secondary | ICD-10-CM | POA: Insufficient documentation

## 2014-03-26 DIAGNOSIS — F988 Other specified behavioral and emotional disorders with onset usually occurring in childhood and adolescence: Secondary | ICD-10-CM | POA: Insufficient documentation

## 2014-03-28 ENCOUNTER — Encounter (HOSPITAL_COMMUNITY): Payer: Self-pay | Admitting: *Deleted

## 2014-03-28 ENCOUNTER — Telehealth: Payer: Self-pay

## 2014-03-28 ENCOUNTER — Emergency Department (HOSPITAL_COMMUNITY)
Admission: EM | Admit: 2014-03-28 | Discharge: 2014-03-28 | Disposition: A | Discharge status: Home / Self Care | Payer: BC Managed Care – PPO | Attending: Emergency Medicine | Admitting: Emergency Medicine

## 2014-03-28 DIAGNOSIS — R197 Diarrhea, unspecified: Secondary | ICD-10-CM | POA: Insufficient documentation

## 2014-03-28 DIAGNOSIS — F329 Major depressive disorder, single episode, unspecified: Secondary | ICD-10-CM | POA: Insufficient documentation

## 2014-03-28 DIAGNOSIS — F909 Attention-deficit hyperactivity disorder, unspecified type: Secondary | ICD-10-CM | POA: Diagnosis not present

## 2014-03-28 DIAGNOSIS — Z9884 Bariatric surgery status: Secondary | ICD-10-CM | POA: Insufficient documentation

## 2014-03-28 DIAGNOSIS — Z8669 Personal history of other diseases of the nervous system and sense organs: Secondary | ICD-10-CM | POA: Diagnosis not present

## 2014-03-28 DIAGNOSIS — Z79899 Other long term (current) drug therapy: Secondary | ICD-10-CM | POA: Insufficient documentation

## 2014-03-28 DIAGNOSIS — F5000 Anorexia nervosa, unspecified: Secondary | ICD-10-CM | POA: Diagnosis not present

## 2014-03-28 DIAGNOSIS — Z8742 Personal history of other diseases of the female genital tract: Secondary | ICD-10-CM | POA: Insufficient documentation

## 2014-03-28 DIAGNOSIS — R531 Weakness: Secondary | ICD-10-CM | POA: Diagnosis present

## 2014-03-28 DIAGNOSIS — R42 Dizziness and giddiness: Secondary | ICD-10-CM

## 2014-03-28 DIAGNOSIS — J45909 Unspecified asthma, uncomplicated: Secondary | ICD-10-CM | POA: Diagnosis not present

## 2014-03-28 DIAGNOSIS — R112 Nausea with vomiting, unspecified: Secondary | ICD-10-CM | POA: Diagnosis not present

## 2014-03-28 DIAGNOSIS — F419 Anxiety disorder, unspecified: Secondary | ICD-10-CM | POA: Insufficient documentation

## 2014-03-28 LAB — CBC WITH DIFFERENTIAL/PLATELET
BASOS PCT: 0 % (ref 0–1)
Basophils Absolute: 0 10*3/uL (ref 0.0–0.1)
EOS ABS: 0.1 10*3/uL (ref 0.0–0.7)
Eosinophils Relative: 1 % (ref 0–5)
HEMATOCRIT: 40.8 % (ref 36.0–46.0)
HEMOGLOBIN: 14.8 g/dL (ref 12.0–15.0)
Lymphocytes Relative: 41 % (ref 12–46)
Lymphs Abs: 3.1 10*3/uL (ref 0.7–4.0)
MCH: 33.1 pg (ref 26.0–34.0)
MCHC: 36.3 g/dL — AB (ref 30.0–36.0)
MCV: 91.3 fL (ref 78.0–100.0)
MONO ABS: 0.3 10*3/uL (ref 0.1–1.0)
MONOS PCT: 4 % (ref 3–12)
NEUTROS ABS: 4 10*3/uL (ref 1.7–7.7)
Neutrophils Relative %: 54 % (ref 43–77)
Platelets: 279 10*3/uL (ref 150–400)
RBC: 4.47 MIL/uL (ref 3.87–5.11)
RDW: 12.6 % (ref 11.5–15.5)
WBC: 7.5 10*3/uL (ref 4.0–10.5)

## 2014-03-28 LAB — COMPREHENSIVE METABOLIC PANEL
ALBUMIN: 4 g/dL (ref 3.5–5.2)
ALT: 31 U/L (ref 0–35)
ANION GAP: 6 (ref 5–15)
AST: 33 U/L (ref 0–37)
Alkaline Phosphatase: 69 U/L (ref 39–117)
BILIRUBIN TOTAL: 0.4 mg/dL (ref 0.3–1.2)
BUN: 9 mg/dL (ref 6–23)
CHLORIDE: 107 meq/L (ref 96–112)
CO2: 27 mmol/L (ref 19–32)
CREATININE: 0.95 mg/dL (ref 0.50–1.10)
Calcium: 9 mg/dL (ref 8.4–10.5)
GFR calc Af Amer: >90 mL/min (ref >90)
GFR calc non Af Amer: 85 mL/min — ABNORMAL LOW (ref >90)
Glucose, Bld: 88 mg/dL (ref 70–99)
Potassium: 3.6 mmol/L (ref 3.5–5.1)
Sodium: 140 mmol/L (ref 135–145)
Total Protein: 6.3 g/dL (ref 6.0–8.3)

## 2014-03-28 LAB — LIPASE, BLOOD: Lipase: 38 U/L (ref 11–59)

## 2014-03-28 MED ORDER — SODIUM CHLORIDE 0.9 % IV BOLUS (SEPSIS)
500.0000 mL | Freq: Once | INTRAVENOUS | Status: AC
  Administered 2014-03-28: 500 mL via INTRAVENOUS

## 2014-03-28 NOTE — ED Provider Notes (Signed)
CSN: [id number]     Arrival date & time 03/28/14  1720 History   First MD Initiated Contact with Patient 03/28/14 1915     Chief Complaint  Patient presents with  . Dehydration  . Weakness     (Consider location/radiation/quality/duration/timing/severity/associated sxs/prior Treatment) Patient is a 23 y.o. female presenting with dizziness.  Dizziness Quality:  Lightheadedness Severity:  Severe Onset quality:  Gradual Duration:  3 days Timing:  Constant Progression:  Unchanged Chronicity:  New Context: bending over, physical activity and standing up   Context: not with loss of consciousness   Relieved by:  Nothing Worsened by:  Nothing tried Ineffective treatments:  None tried Associated symptoms: diarrhea (takes laxatives), nausea and vomiting   Associated symptoms: no blood in stool, no chest pain, no headaches and no shortness of breath     Past Medical History  Diagnosis Date  . Endometriosis   . Adult ADHD   . Anxiety and depression   . Pseudotumor cerebri 2011  . H/O gastric bypass   . Allergy   . Anxiety   . Asthma   . Depression   . Anorexia    Past Surgical History  Procedure Laterality Date  . Colonoscopy    . Lumbar puncture    . Laparoscopic endometriosis fulguration    . Cosmetic surgery    . Gastric bypass     Family History  Problem Relation Age of Onset  . Heart disease Maternal Grandfather   . Hypertension Maternal Grandfather   . Stroke Maternal Grandfather   . Diabetes Father   . Hypertension Father   . Hypertension Paternal Grandmother   . Mental retardation Paternal Grandmother   . Diabetes Paternal Grandfather   . Hyperlipidemia Paternal Grandfather   . Hypertension Paternal Grandfather    History  Substance Use Topics  . Smoking status: Never Smoker   . Smokeless tobacco: Never Used  . Alcohol Use: 0.0 oz/week    0 Not specified per week     Comment: Socially   OB History    No data available     Review of Systems   Constitutional: Positive for activity change, appetite change (anorexia, continued low intake) and fatigue. Negative for fever.  HENT: Negative for sore throat.   Eyes: Negative for visual disturbance.  Respiratory: Negative for cough and shortness of breath.   Cardiovascular: Negative for chest pain.  Gastrointestinal: Positive for nausea, vomiting and diarrhea (takes laxatives). Negative for abdominal pain and blood in stool.  Genitourinary: Negative for flank pain, vaginal bleeding, vaginal discharge and difficulty urinating.  Musculoskeletal: Negative for back pain and neck pain.  Skin: Negative for rash.  Neurological: Positive for dizziness and light-headedness. Negative for syncope and headaches.      Allergies  Corticosteroids; Gluten meal; Nsaids; Sulfa antibiotics; and Morphine and related  Home Medications   Prior to Admission medications   Medication Sig Start Date End Date Taking? Authorizing Provider  bisacodyl (DULCOLAX) 5 MG EC tablet Take 5 mg by mouth daily as needed for moderate constipation.   Yes Historical Provider, MD  clonazePAM (KLONOPIN) 0.5 MG tablet Take 0.5-1 tablets (0.25-0.5 mg total) by mouth 2 (two) times daily as needed for anxiety. Patient taking differently: Take 0.25-0.5 mg by mouth 4 (four) times daily.  03/19/14  Yes Ethelda ChickKristi M Smith, MD  cyanocobalamin (,VITAMIN B-12,) 1000 MCG/ML injection Inject 1,000 mcg into the muscle every 30 (thirty) days.   Yes Historical Provider, MD  docusate sodium (COLACE) 100 MG capsule Take  100 mg by mouth daily as needed for mild constipation.   Yes Historical Provider, MD  FLUoxetine (PROZAC) 20 MG tablet Take 1 tablet (20 mg total) by mouth daily. 03/19/14  Yes Ethelda Chick, MD  hydrOXYzine (VISTARIL) 25 MG capsule Take 1 capsule (25 mg total) by mouth at bedtime as needed for anxiety. 03/24/14  Yes Morrell Riddle, PA-C  levonorgestrel (MIRENA) 20 MCG/24HR IUD 1 each by Intrauterine route continuous.   Yes  Historical Provider, MD  polyethylene glycol powder (GLYCOLAX/MIRALAX) powder Take 17 g by mouth 2 (two) times daily. Until daily soft stools  OTC 04/04/13  Yes Rudene Anda, PA-C  Vitamin D, Ergocalciferol, (DRISDOL) 50000 UNITS CAPS capsule Take 100,000 Units by mouth every 7 (seven) days.   Yes Historical Provider, MD  amphetamine-dextroamphetamine (ADDERALL) 20 MG tablet Take 20 mg by mouth 2 (two) times daily.    Historical Provider, MD   BP 103/69 mmHg  Pulse 51  Temp(Src) 98.4 F (36.9 C) (Oral)  Resp 100  SpO2 100% Physical Exam  Constitutional: She is oriented to person, place, and time. She appears well-developed. No distress.  HENT:  Head: Normocephalic and atraumatic.  Eyes: Conjunctivae and EOM are normal.  Neck: Normal range of motion.  Cardiovascular: Normal rate, regular rhythm, normal heart sounds and intact distal pulses.  Exam reveals no gallop and no friction rub.   No murmur heard. Pulmonary/Chest: Effort normal and breath sounds normal. No respiratory distress. She has no wheezes. She has no rales.  Abdominal: Soft. She exhibits no distension. There is no tenderness. There is no guarding.  Musculoskeletal: She exhibits no edema or tenderness.  Neurological: She is alert and oriented to person, place, and time.  Skin: Skin is warm and dry. No rash noted. She is not diaphoretic. No erythema.  Nursing note and vitals reviewed.   ED Course  Procedures (including critical care time) Labs Review Labs Reviewed  CBC WITH DIFFERENTIAL - Abnormal; Notable for the following:    MCHC 36.3 (*)    All other components within normal limits  COMPREHENSIVE METABOLIC PANEL - Abnormal; Notable for the following:    GFR calc non Af Amer 85 (*)    All other components within normal limits  LIPASE, BLOOD    Imaging Review No results found.   EKG Interpretation None      Vent. rate 53 BPM PR interval 145 ms QRS duration 72 ms QT/QTc 461/433 ms P-R-T axes 77 82  73 Sinus bradycardia with benign early repol, no prolonged QTc, no brugada, no delta waves.  MDM   Final diagnoses:  Anorexia nervosa  Lightheadedness   22 year old female with history of gastric bypass, pseudotumor cerebri and recent diagnosis of anorexia presents with concern of fatigue, lightheadedness after stopping adderall 3 days ago. EKG shows sinus bradycardia with benign early repol, no prolonged QTc, no brugada, no delta waves and have low suspicion for arrhythmia as cause of pt's lightheadedness.  Patient's labs do not show electrolyte abnormalities, pt with mild bradycardia to 50s however hemodynamically stable and medically appropriate for continued outpatient management of anorexia. Symptoms likely secondary to poor nutrition, possible adderall withdrawal.  Gave IVF given n/v, poor intake, diarrhea secondary to laxatives. Abd exam benign and doubt acute surgical abnormality.  Pt refuses urine preg reporting she is not pregnant and recently had negative test.  Recommend continued close outpatient management of anorexia with team in place (therapist, nutritionist.)   Patient discharged in stable condition with  understanding of reasons to return.    Rhae LernerErin Elizabeth Anacleto Batterman, MD 03/29/14 1236  Nelia Shiobert L Beaton, MD 04/01/14 1030

## 2014-03-28 NOTE — ED Notes (Signed)
Patient and patient's parents refused to have EKG completed and took all the leads off. Patient's mother stated that her daughter had just had an EKG done this past Tuesday and did not understand the reason as to why the doctor would order another EKG. Patient's mother requests to see the doctor as they want to leave.

## 2014-03-28 NOTE — ED Notes (Signed)
Pt and family member reports pt recently diagnosed with anorexia, not able to get pt to eat or drink much over the past few days. Pt feels near syncope and very lethargic/fatigued.

## 2014-03-28 NOTE — ED Notes (Signed)
The pt finally agreed to get the  Iv fluid then refused the ekg that was ordered

## 2014-03-28 NOTE — ED Notes (Signed)
The pt is upset and crying she does not want the iv fluid.  Her saline lok was  Placed  Initially when she came into the room no problem with that.  She reports that there is no need to get the iv fluid she will just pee it out

## 2014-03-28 NOTE — ED Notes (Signed)
The pt fainted earlier today her family thinks that she is dehydrated

## 2014-03-28 NOTE — ED Notes (Signed)
The pt has gastric bypas surgery in 2014. She has lost from 300 lbs to 100lbs.  She has been diagnosed with anorexia in the past  Few months. Not eating very much food in 2 weeks.  She ate [employer] earlier today then vomited it back.  Eating ice at present coming from triage

## 2014-03-28 NOTE — Discharge Instructions (Signed)
Eating Disorders Eating disorders are medical and psychological problems. They often have psychological or emotional causes. Depression, obsession with food, and a distorted body image are common in patients. Over time, eating disorders can damage your body. The most common eating disorders are:  Bulimia Nervosa. This is when a person eats large amounts of food and cannot stop (binge eating). This is often followed by vomiting, excessive exercise, or taking laxatives (purging). This is done to get rid of the calories eaten. Bulimia may start as a way to control weight. Later, it may be caused by stress or an emotional crisis.  Anorexia Nervosa. This is when a person has an extremely low body weight from severe dieting, compulsive exercising, or both. Losing weight or preventing weight gain becomes an obsession. It is often used as a way to cope with emotional problems.  Eating Disorders Not Otherwise Specified (EDNOS). This diagnosis is used for patients who have some of the symptoms of bulimia nervosa or anorexia nervosa. However, they do not have all the criteria to diagnose a specific disorder. These disorders can lead to serious medical problems. These may include:  Extreme malnutrition.  Hormone imbalance.  Vitamin and mineral deficiencies.  Organ damage. DIAGNOSIS  Your caregiver will do a complete physical exam. A psychological evaluation is also needed. This will include questions about your eating habits and self-image. Blood and urine tests may also be done. TREATMENT  Treatment includes:  Counseling.  Proper nutrition.  Proper exercise.  Sometimes, medicine.  Hospital care in extreme cases. HOME CARE INSTRUCTIONS   Learn about your eating disorder.  Identify situations that cause the urge to overeat or diet.  Develop a plan when you have urges to overeat or diet.  Learn who can support you when you need to talk.  Resist weighing yourself or checking yourself in  the mirror often.  Follow your caregiver's meal and exercise plan.  Keep all follow-up referrals and appointments. This is very important.  Get regular dental care every 6 months. SEEK MEDICAL CARE IF:   You have a rapid weight loss or gain.  You cause yourself to vomit after eating.  You abuse stimulants or diet aids.  You have compulsive exercise habits.  You have an irregular heartbeat (pulse).  You have a constant fear of gaining weight.  You take laxatives after eating.  You have compulsive eating or dieting habits.  You have irregular menstrual periods.  You lose your menstrual periods. SEEK IMMEDIATE MEDICAL CARE IF:   You have blood or brown flecks in your vomit. This may look like coffee grounds.  You have bright red or black, tarry stools.  You have chest pain or pressure.  You have difficulty breathing.  You do not urinate every 8 hours. FOR MORE INFORMATION Alliance for Eating Disorders Awareness: www.allianceforeatingdisorders.com National Association of Anorexia Nervosa and Associated Disorders: www.anad.org National Eating Disorders Association: www.nationaleatingdisorders.org Eating Disorders Anonymous: www.eatingdisordersanonymous.org Eating Disorders Resource Center: AmericasFunny.chwww.edrcsv.org Document Released: 03/20/2005 Document Revised: 06/12/2011 Document Reviewed: 10/20/2010 [facility]Exit[facility] Patient Information 2015 [place] VillageExitCare, [place]LLC. This information is not intended to replace advice given to you by your health care provider. Make sure you discuss any questions you have with your health care provider.  Dizziness Dizziness is a common problem. It is a feeling of unsteadiness or light-headedness. You may feel like you are about to faint. Dizziness can lead to injury if you stumble or fall. A person of any age group can suffer from dizziness, but dizziness is more common in  older adults. CAUSES  Dizziness can be caused by many different things, including:  Middle  ear problems.  Standing for too long.  Infections.  An allergic reaction.  Aging.  An emotional response to something, such as the sight of blood.  Side effects of medicines.  Tiredness.  Problems with circulation or blood pressure.  Excessive use of alcohol or medicines, or illegal drug use.  Breathing too fast (hyperventilation).  An irregular heart rhythm (arrhythmia).  A low red blood cell count (anemia).  Pregnancy.  Vomiting, diarrhea, fever, or other illnesses that cause body fluid loss (dehydration).  Diseases or conditions such as Parkinson's disease, high blood pressure (hypertension), diabetes, and thyroid problems.  Exposure to extreme heat. DIAGNOSIS  Your health care provider will ask about your symptoms, perform a physical exam, and perform an electrocardiogram (ECG) to record the electrical activity of your heart. Your health care provider may also perform other heart or blood tests to determine the cause of your dizziness. These may include:  Transthoracic echocardiogram (TTE). During echocardiography, sound waves are used to evaluate how blood flows through your heart.  Transesophageal echocardiogram (TEE).  Cardiac monitoring. This allows your health care provider to monitor your heart rate and rhythm in real time.  Holter monitor. This is a portable device that records your heartbeat and can help diagnose heart arrhythmias. It allows your health care provider to track your heart activity for several days if needed.  Stress tests by exercise or by giving medicine that makes the heart beat faster. TREATMENT  Treatment of dizziness depends on the cause of your symptoms and can vary greatly. HOME CARE INSTRUCTIONS   Drink enough fluids to keep your urine clear or pale yellow. This is especially important in very hot weather. In older adults, it is also important in cold weather.  Take your medicine exactly as directed if your dizziness is caused by  medicines. When taking blood pressure medicines, it is especially important to get up slowly.  Rise slowly from chairs and steady yourself until you feel okay.  In the morning, first sit up on the side of the bed. When you feel okay, stand slowly while holding onto something until you know your balance is fine.  Move your legs often if you need to stand in one place for a long time. Tighten and relax your muscles in your legs while standing.  Have someone stay with you for 1-2 days if dizziness continues to be a problem. Do this until you feel you are well enough to stay alone. Have the person call your health care provider if he or she notices changes in you that are concerning.  Do not drive or use heavy machinery if you feel dizzy.  Do not drink alcohol. SEEK IMMEDIATE MEDICAL CARE IF:   Your dizziness or light-headedness gets worse.  You feel nauseous or vomit.  You have problems talking, walking, or using your arms, hands, or legs.  You feel weak.  You are not thinking clearly or you have trouble forming sentences. It may take a friend or family member to notice this.  You have chest pain, abdominal pain, shortness of breath, or sweating.  Your vision changes.  You notice any bleeding.  You have side effects from medicine that seems to be getting worse rather than better. MAKE SURE YOU:   Understand these instructions.  Will watch your condition.  Will get help right away if you are not doing well or get worse. Document Released:  09/13/2000 Document Revised: 03/25/2013 Document Reviewed: 10/07/2010 [facility]Exit[facility] Patient Information 2015 [place]ExitCare, [place]. This information is not intended to replace advice given to you by your health care provider. Make sure you discuss any questions you have with your health care provider.

## 2014-03-28 NOTE — Telephone Encounter (Signed)
The patient's mother called to inquire about a dosage increase for her daughter's Klonopin.  She said that Benny LennertSarah Weber said that she could call if her daughter needed a higher mg dosage of the medication.  She said that her daughter will be coming in on 03/31/14, but she would like a new prescription before that date, if possible.  The patient's pharmacy is the [facility] on [address]. Patient CB#: [phone]

## 2014-03-31 ENCOUNTER — Encounter: Payer: Self-pay | Admitting: Physician Assistant

## 2014-03-31 ENCOUNTER — Other Ambulatory Visit (INDEPENDENT_AMBULATORY_CARE_PROVIDER_SITE_OTHER): Payer: Self-pay

## 2014-03-31 ENCOUNTER — Ambulatory Visit (INDEPENDENT_AMBULATORY_CARE_PROVIDER_SITE_OTHER): Payer: BC Managed Care – PPO | Admitting: Physician Assistant

## 2014-03-31 VITALS — BP 100/60 | HR 87 | Temp 97.5°F | Resp 16 | Ht 69.0 in | Wt 142.0 lb

## 2014-03-31 DIAGNOSIS — Z9884 Bariatric surgery status: Secondary | ICD-10-CM

## 2014-03-31 DIAGNOSIS — F5000 Anorexia nervosa, unspecified: Secondary | ICD-10-CM

## 2014-03-31 DIAGNOSIS — F411 Generalized anxiety disorder: Secondary | ICD-10-CM

## 2014-03-31 MED ORDER — CLONAZEPAM 0.5 MG PO TABS: 0.5000 mg | ORAL_TABLET | Freq: Four times a day (QID) | ORAL | Status: DC | PRN

## 2014-03-31 MED ORDER — FLUOXETINE HCL 40 MG PO CAPS: 40.0000 mg | ORAL_CAPSULE | Freq: Every day | ORAL | Status: DC

## 2014-03-31 MED ORDER — CLONAZEPAM 1 MG PO TABS
1.0000 mg | ORAL_TABLET | ORAL | Status: DC | PRN
Start: 2014-03-31 — End: 2014-04-07

## 2014-03-31 NOTE — Telephone Encounter (Signed)
Called pt and she reported that she is coming in tonight to see Maralyn SagoSarah, and she would like for me to just give the Rx to Maralyn SagoSarah instead of faxing it bc her therapist wants her to discuss with Maralyn SagoSarah. Advised Sarah and I put the Rx in Sarah's box.

## 2014-03-31 NOTE — Patient Instructions (Signed)
Try a meal a day - think of different foods that you like and pick one of each category to make a meal - protein, fruit/veggie, dairy  We are going to increase your Klonopin to 1mg  every 4-6 hours with a 4 pills a day maximum.  We are going to increase your Prozac - 40mg 

## 2014-03-31 NOTE — Telephone Encounter (Signed)
Please call patient.  I will not be here on 12/29 so it would be best if she could come in today 12/28 or 12/31 for her weekly checks.  I have talked to a bariatric nutritionist about labs that should be ordered and we will do that at her next visit.  I have written her Rx for her Klonopin.

## 2014-03-31 NOTE — Progress Notes (Signed)
Subjective:    Patient ID: Sarah Estes, female    DOB: Jul 27, 1990, 23 y.o.   MRN: [id number]  HPI Pt presents to clinic for a recheck.  She had an ED visit on 12/26 due to parents concerned she was dehydrated and she felt like she was going to pass out.  She reluctantly agreed to 1 L IV fluids.  During her visit there she revealed that she has been abusing laxatives as a method of weight loss.  Since our last visit she has been unable to increase her water intake to greater than about 8oz on most days - but she was able to add a peach tea flavoring that helped and she plans to continue this additive to her water because she does not like the taste of water (she has noticed no swelling in her legs from the salt component).  She has been able to eat a few meals since I last saw her.  She does know that eating sugar causes her to throw-up and today she was at her grandfathers birthday celebration.  She ate some soup and a few bites of salad which would have been a good meal and then cake was served - she started thinking that she wanted a few strawberries from the cake and then she realized that she could eat to much and then blame her vomiting on her sugar reaction 2nd to bypass.  On her way here she wanted her weight to be higher so she quickly drank an Ensure and has been vomiting since. - to much sweet liquid to fast.  They have told her siblings and grandparents about her eating disorder.  It is no longer a secret in her immediate family. They are not leaving her home alone.  She did have an episode with her brother at home with her where she ate was she was asked to and then went into the bathroom and turned on the shower and used a toothbrush to gag herself and purged the meal.  She brother called her mom to check-in and mom figured out what she was doing and the brother interrupted her but she had already purged.  Mom is with her in the room and dominates a lot of the conversation - sometimes  even to talk about her need for weight loss.  She does allow the patient to speak sometimes - seems to be more - mom keeps stating she just wants to understand how to help her daughter.  New goals with nutritionist is - 2 ensure plus a day - finish it in 3 hours - and a meal - along with 64 oz of water - she feels like this is to much for her and the time during the day that she is up she has some type of eating goal to do - she would like to have a few hours a day where she does not have to think about food.  Mom has hidden the scale and this is causing her a lot of anxiety.  She has found that since she cannot find the scale (mother states that she has not asked where it was) she has had increased anxiety about her weight.  She has found that her sleep has decreased because she is anxious and cleaning.  She is not sleeping due to increased anxiety.  The atarax did not help her sleep and the Klonopin also does not help.  She has been on xanax and Ativan in the past without much  help in her anxiety level.  She is tolerating the prozac well.  Appt with [facility] next wed to evaluation.  Review of Systems     Objective:   Physical Exam  Constitutional: She is oriented to person, place, and time. She appears well-developed and well-nourished.  BP 100/60 mmHg  Pulse 87  Temp(Src) 97.5 F (36.4 C) (Oral)  Resp 16  Ht 5\' 9"  (1.753 m)  Wt 142 lb (64.411 kg)  BMI 20.96 kg/m2  SpO2 99%  Pt throws up multiple times in the room but towards the end of her 2 hour visit she had not thrown up in about 30 mins and stated that she felt better (she also stated she learned her lesson on trying to trick the scales)   HENT:  Head: Normocephalic and atraumatic.  Right Ear: External ear normal.  Left Ear: External ear normal.  Eyes: Conjunctivae are normal.  Neck: Normal range of motion.  Pulmonary/Chest: Effort normal.  Neurological: She is alert and oriented to person, place, and time.  Skin: Skin is  warm.  Psychiatric: She has a normal mood and affect. Her behavior is normal. Judgment and thought content normal.        Assessment & Plan:  Anorexia nervosa - we will increase her prozac to help with her anxiety - we are also going to increase her Klonopin so she has less anxiety associated with food. For now we are going to leave to insomnia alone and hope that her decreased anxiety will allow her to sleep.  We talked about goals and I am concerned that the goals that the nutritionist set fort are to aggressive for her at this time but I also do not want to undermine her role in the patient's care.  I think that water needs to be a large part of her goals because she is concerned about constipation whch is how she started abusing laxatives in the 1st place - I also am concerned that she is going to have some constipation with the fact that she has stopped the laxatives and she intestines are going to have to reprogram themselves to work normal.  She is going to school for nutrition and she is going to think of several different foods in each food group that she feels cause her less anxiety such as apples, cheese and peanuts butter which we came up in the room - she has an aversion to bread so that is not part of her meal ideas.  She is then going to work with the nutritionist to determine appropriate size portions for these and eat 3 of these foods a day.  She will try and have a fruit, protein and vegetable a day.  Her goal is to have 2 Ensures a day but I think that for now 1 a day will be fine but did not discuss this with her but plan on talking with the nutritionist about the aggressive goals though I did tell the patient that this is in an attempt to keep her out of the hospital which she seemed to understand and accept.  She is currently not exercising and I wrote her a letter to suspend her gym membership.  Plan: FLUoxetine (PROZAC) 40 MG capsule  GAD (generalized anxiety disorder) - Plan:  clonazePAM (KLONOPIN) 1 MG tablet,  S/P gastric bypass - Check bariatric panel due to malnutrition and patient is not taking her MVI - she is taking Vit D and B12 injection.  Plan: Hemoglobin A1c, Prealbumin,  Vit D  25 hydroxy (rtn osteoporosis monitoring), Folate, Magnesium, Hepatic function panel, Lipid panel, CBC with Differential, Iron and TIBC, Uric acid, Transferrin, TSH, Vitamin B1, Vitamin B6, Vitamin B12, Vitamin A, Ferritin, Copper, Serum  ADD - I wrote her out of work for the next week and then to return to work for 4 hours shift for the following week.  This will allow to time to eat (she does not like eating at work) and it will give her a few hours that she does not have to worry about food intake).  I also am not wanting her to be on her stimulant for her ADD currently and she is worried about her working long hours without this medication.  I plan on writing a letter to the general surgeon for her appt on Wed and she signed a medical release to get the records sent from [facility] where her surgery was done.  She is going to work on her water and 1 meal a day goal and a list of foods that she feels like she can eat and make sure they are in the house.  She is going to see me next week.  She was given her weight today until I can figure out how to keep it off mychart for her research.  I may have to add it to the body of the note vs in her vitals section.  Benny LennertSarah Xiong Haidar PA-C  [facility] 04/02/2014 1:32 PM

## 2014-04-01 LAB — TRANSFERRIN: TRANSFERRIN: 237 mg/dL (ref 200–360)

## 2014-04-01 LAB — CBC WITH DIFFERENTIAL/PLATELET
BASOS ABS: 0 10*3/uL (ref 0.0–0.1)
BASOS PCT: 0 % (ref 0–1)
EOS ABS: 0.1 10*3/uL (ref 0.0–0.7)
EOS PCT: 1 % (ref 0–5)
HEMATOCRIT: 39.9 % (ref 36.0–46.0)
Hemoglobin: 13.8 g/dL (ref 12.0–15.0)
Lymphocytes Relative: 40 % (ref 12–46)
Lymphs Abs: 3.1 10*3/uL (ref 0.7–4.0)
MCH: 31.9 pg (ref 26.0–34.0)
MCHC: 34.6 g/dL (ref 30.0–36.0)
MCV: 92.4 fL (ref 78.0–100.0)
MONOS PCT: 4 % (ref 3–12)
MPV: 11.4 fL (ref 8.6–12.4)
Monocytes Absolute: 0.3 10*3/uL (ref 0.1–1.0)
NEUTROS ABS: 4.2 10*3/uL (ref 1.7–7.7)
Neutrophils Relative %: 55 % (ref 43–77)
PLATELETS: 277 10*3/uL (ref 150–400)
RBC: 4.32 MIL/uL (ref 3.87–5.11)
RDW: 12.9 % (ref 11.5–15.5)
WBC: 7.7 10*3/uL (ref 4.0–10.5)

## 2014-04-01 LAB — IRON AND TIBC
%SAT: 30 % (ref 20–55)
IRON: 93 ug/dL (ref 42–145)
TIBC: 312 ug/dL (ref 250–470)
UIBC: 219 ug/dL (ref 125–400)

## 2014-04-01 LAB — HEPATIC FUNCTION PANEL
ALBUMIN: 4.1 g/dL (ref 3.5–5.2)
ALK PHOS: 56 U/L (ref 39–117)
ALT: 30 U/L (ref 0–35)
AST: 18 U/L (ref 0–37)
Bilirubin, Direct: 0.1 mg/dL (ref 0.0–0.3)
Indirect Bilirubin: 0.3 mg/dL (ref 0.2–1.2)
Total Bilirubin: 0.4 mg/dL (ref 0.2–1.2)
Total Protein: 6.2 g/dL (ref 6.0–8.3)

## 2014-04-01 LAB — MAGNESIUM: Magnesium: 2.1 mg/dL (ref 1.5–2.5)

## 2014-04-01 LAB — HEMOGLOBIN A1C
Hgb A1c MFr Bld: 5.2 % (ref <5.7)
MEAN PLASMA GLUCOSE: 103 mg/dL (ref <117)

## 2014-04-01 LAB — TSH: TSH: 3.927 u[IU]/mL (ref 0.350–4.500)

## 2014-04-01 LAB — URIC ACID: URIC ACID, SERUM: 3.7 mg/dL (ref 2.4–7.0)

## 2014-04-01 LAB — VITAMIN B12: Vitamin B-12: 480 pg/mL (ref 211–911)

## 2014-04-01 LAB — FOLATE: Folate: 10.1 ng/mL

## 2014-04-01 LAB — LIPID PANEL
Cholesterol: 127 mg/dL (ref 0–200)
HDL: 61 mg/dL (ref >39)
LDL Cholesterol: 56 mg/dL (ref 0–99)
Total CHOL/HDL Ratio: 2.1 Ratio
Triglycerides: 50 mg/dL (ref <150)
VLDL: 10 mg/dL (ref 0–40)

## 2014-04-01 LAB — FERRITIN: Ferritin: 105 ng/mL (ref 10–291)

## 2014-04-01 LAB — PREALBUMIN: Prealbumin: 22.2 mg/dL (ref 17.0–34.0)

## 2014-04-02 LAB — VITAMIN D 25 HYDROXY (VIT D DEFICIENCY, FRACTURES): Vit D, 25-Hydroxy: 31 ng/mL (ref 30–100)

## 2014-04-04 LAB — COPPER, SERUM: Copper: 50 ug/dL — ABNORMAL LOW (ref 70–175)

## 2014-04-04 LAB — VITAMIN A: Vitamin A (Retinoic Acid): 37 ug/dL — ABNORMAL LOW (ref 38–98)

## 2014-04-05 ENCOUNTER — Encounter: Payer: Self-pay | Admitting: Physician Assistant

## 2014-04-06 LAB — VITAMIN B6: VITAMIN B6: 12.1 ng/mL (ref 2.1–21.7)

## 2014-04-07 ENCOUNTER — Ambulatory Visit (INDEPENDENT_AMBULATORY_CARE_PROVIDER_SITE_OTHER): Payer: BC Managed Care – PPO | Admitting: Physician Assistant

## 2014-04-07 VITALS — BP 118/62 | HR 87 | Temp 98.3°F | Resp 16

## 2014-04-07 DIAGNOSIS — F5000 Anorexia nervosa, unspecified: Secondary | ICD-10-CM

## 2014-04-07 DIAGNOSIS — F4310 Post-traumatic stress disorder, unspecified: Secondary | ICD-10-CM

## 2014-04-07 DIAGNOSIS — Z9884 Bariatric surgery status: Secondary | ICD-10-CM

## 2014-04-07 DIAGNOSIS — F411 Generalized anxiety disorder: Secondary | ICD-10-CM

## 2014-04-07 LAB — VITAMIN B1: VITAMIN B1 (THIAMINE): 13 nmol/L (ref 8–30)

## 2014-04-07 MED ORDER — CLONAZEPAM 1 MG PO TABS: ORAL_TABLET | ORAL | Status: DC

## 2014-04-07 NOTE — Patient Instructions (Addendum)
Continue Prozac  Get list of therapist from Berneice GandyLori King  Klonopin you can take up to 2 pills 4 times a day - with 3 pills at night to help with insomnia
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
## 2014-04-10 ENCOUNTER — Ambulatory Visit (INDEPENDENT_AMBULATORY_CARE_PROVIDER_SITE_OTHER): Payer: BC Managed Care – PPO | Admitting: Family Medicine

## 2014-04-10 VITALS — BP 106/70 | HR 68 | Temp 97.5°F | Resp 18

## 2014-04-10 DIAGNOSIS — Z9884 Bariatric surgery status: Secondary | ICD-10-CM

## 2014-04-10 DIAGNOSIS — F411 Generalized anxiety disorder: Secondary | ICD-10-CM

## 2014-04-10 DIAGNOSIS — F50019 Anorexia nervosa, restricting type, unspecified: Secondary | ICD-10-CM

## 2014-04-10 DIAGNOSIS — F5001 Anorexia nervosa, restricting type: Secondary | ICD-10-CM

## 2014-04-10 DIAGNOSIS — F4310 Post-traumatic stress disorder, unspecified: Secondary | ICD-10-CM

## 2014-04-10 DIAGNOSIS — R55 Syncope and collapse: Secondary | ICD-10-CM

## 2014-04-10 DIAGNOSIS — E162 Hypoglycemia, unspecified: Secondary | ICD-10-CM

## 2014-04-10 LAB — POCT URINALYSIS DIPSTICK
Bilirubin, UA: NEGATIVE
Blood, UA: NEGATIVE
GLUCOSE UA: NEGATIVE
Ketones, UA: NEGATIVE
NITRITE UA: NEGATIVE
Protein, UA: NEGATIVE
Spec Grav, UA: <=1.005
UROBILINOGEN UA: 0.2
pH, UA: 6

## 2014-04-10 LAB — GLUCOSE, POCT (MANUAL RESULT ENTRY): POC Glucose: 75 mg/dl (ref 70–99)
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
## 2014-04-14 ENCOUNTER — Ambulatory Visit (INDEPENDENT_AMBULATORY_CARE_PROVIDER_SITE_OTHER): Payer: BC Managed Care – PPO | Admitting: Physician Assistant

## 2014-04-14 VITALS — BP 100/60 | HR 69 | Temp 97.5°F | Resp 16 | Ht 68.5 in

## 2014-04-14 DIAGNOSIS — F4310 Post-traumatic stress disorder, unspecified: Secondary | ICD-10-CM

## 2014-04-14 DIAGNOSIS — F411 Generalized anxiety disorder: Secondary | ICD-10-CM

## 2014-04-14 DIAGNOSIS — K5909 Other constipation: Secondary | ICD-10-CM

## 2014-04-14 DIAGNOSIS — Z9884 Bariatric surgery status: Secondary | ICD-10-CM

## 2014-04-14 DIAGNOSIS — F5001 Anorexia nervosa, restricting type: Secondary | ICD-10-CM

## 2014-04-14 MED ORDER — FLUOXETINE HCL 20 MG PO CAPS: 60.0000 mg | ORAL_CAPSULE | Freq: Every day | ORAL | Status: DC

## 2014-04-14 MED ORDER — DOCUSATE SODIUM 100 MG PO CAPS: 300.0000 mg | ORAL_CAPSULE | Freq: Every day | ORAL | Status: DC

## 2014-04-14 MED ORDER — POLYETHYLENE GLYCOL 3350 17 GM/SCOOP PO POWD: 17.0000 g | Freq: Once | ORAL | Status: DC

## 2014-04-14 NOTE — Patient Instructions (Addendum)
Walk no more than 30 minutes a day slowly.  Out of work for the next 2 weeks.  Needs intensive outpatient therapy to decrease changes on need for inpatient therapy.  Increase Prozac to 60 mg a day -   No school this semester.  Continue goal of 1 meal throughout the day but break it up into smaller components every 2.5 hours   No fat free,  no light foods - low sodium is ok  Colace - to keep the stool soft - daily to prevent constipation from laxative use STOP  Miralax - 1 capful a day - put in your flavored water daily -
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
## 2014-04-16 ENCOUNTER — Telehealth: Payer: Self-pay

## 2014-04-16 NOTE — Telephone Encounter (Signed)
laura king, the pt's dietician at [facility] called to speak with Sarah Estes regarding the pt's current tx.

## 2014-04-17 ENCOUNTER — Encounter: Payer: Self-pay | Admitting: Physician Assistant

## 2014-04-17 ENCOUNTER — Telehealth: Payer: Self-pay

## 2014-04-17 NOTE — Telephone Encounter (Signed)
Called and left her my cell phone number.

## 2014-04-17 NOTE — Telephone Encounter (Signed)
How long? - she can NOT drive.
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
## 2014-04-17 NOTE — Telephone Encounter (Signed)
Pt really needs a call to see if it's ok to travel to [place]. Please advise  [phone]

## 2014-04-18 NOTE — Telephone Encounter (Signed)
Called and Spoke with Sarah GaviaLaura Estes.  She is also worried that the patient is not improving but rather getting worse.  We will continue to monitor and if she is not making improvements it may be time for inpatient.

## 2014-04-19 ENCOUNTER — Encounter: Payer: Self-pay | Admitting: Physician Assistant

## 2014-04-21 ENCOUNTER — Ambulatory Visit (INDEPENDENT_AMBULATORY_CARE_PROVIDER_SITE_OTHER): Payer: BC Managed Care – PPO | Admitting: Physician Assistant

## 2014-04-21 VITALS — BP 100/60 | HR 56 | Temp 97.3°F | Resp 16 | Ht 68.5 in

## 2014-04-21 DIAGNOSIS — G4700 Insomnia, unspecified: Secondary | ICD-10-CM

## 2014-04-21 DIAGNOSIS — Z9884 Bariatric surgery status: Secondary | ICD-10-CM

## 2014-04-21 DIAGNOSIS — F4310 Post-traumatic stress disorder, unspecified: Secondary | ICD-10-CM

## 2014-04-21 DIAGNOSIS — F5001 Anorexia nervosa, restricting type: Secondary | ICD-10-CM

## 2014-04-21 DIAGNOSIS — F411 Generalized anxiety disorder: Secondary | ICD-10-CM

## 2014-04-21 MED ORDER — TRAZODONE HCL 50 MG PO TABS: 50.0000 mg | ORAL_TABLET | Freq: Every evening | ORAL | Status: DC | PRN

## 2014-04-21 NOTE — Patient Instructions (Addendum)
Try trazodone at bedtime. Start 50mg  for 2-3 nights - and increase by 50mg  every 3-4 nights until you either sleep well or you get to 200mg   Increase your Klonopin 2 pills every 6h vs 8h  Decrease Prozac to 40mg  daily in preparation for changing to Effexor.  Continue great job of eating small amounts of food every 2 hours so.  Continue great job of fluid intake.  Decrease miralax to 1/2 capful daily -- this helps with the regulatity Decrease Colace to 1 - this helps with the softness  Coloring book!!!
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
## 2014-04-23 ENCOUNTER — Encounter: Payer: Self-pay | Admitting: Physician Assistant

## 2014-04-24 ENCOUNTER — Other Ambulatory Visit: Payer: Self-pay | Admitting: Physician Assistant

## 2014-04-26 ENCOUNTER — Telehealth: Payer: Self-pay | Admitting: Physician Assistant

## 2014-04-26 NOTE — Telephone Encounter (Signed)
Patient called concerning about her blood pressure. BP 77/40 Heart Rate 72. I advised patient to come in to be seen. She stated she will wait until someone give her a call back. Patient stated she will re check her blood pressure later. [phone]
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
## 2014-04-28 ENCOUNTER — Telehealth: Payer: Self-pay

## 2014-04-28 ENCOUNTER — Observation Stay (HOSPITAL_COMMUNITY)
Admission: AD | Admit: 2014-04-28 | Discharge: 2014-04-29 | Disposition: A | Discharge status: Home / Self Care | Payer: BC Managed Care – PPO | Source: Ambulatory Visit | Attending: Family Medicine | Admitting: Family Medicine

## 2014-04-28 ENCOUNTER — Ambulatory Visit (INDEPENDENT_AMBULATORY_CARE_PROVIDER_SITE_OTHER): Payer: BC Managed Care – PPO | Admitting: Physician Assistant

## 2014-04-28 VITALS — BP 86/58 | HR 85 | Temp 97.6°F | Resp 12 | Ht 68.5 in

## 2014-04-28 DIAGNOSIS — F329 Major depressive disorder, single episode, unspecified: Secondary | ICD-10-CM | POA: Diagnosis not present

## 2014-04-28 DIAGNOSIS — F411 Generalized anxiety disorder: Secondary | ICD-10-CM | POA: Insufficient documentation

## 2014-04-28 DIAGNOSIS — E860 Dehydration: Secondary | ICD-10-CM | POA: Diagnosis not present

## 2014-04-28 DIAGNOSIS — F4310 Post-traumatic stress disorder, unspecified: Secondary | ICD-10-CM | POA: Insufficient documentation

## 2014-04-28 DIAGNOSIS — G4700 Insomnia, unspecified: Secondary | ICD-10-CM

## 2014-04-28 DIAGNOSIS — F5000 Anorexia nervosa, unspecified: Secondary | ICD-10-CM | POA: Diagnosis present

## 2014-04-28 DIAGNOSIS — Z882 Allergy status to sulfonamides status: Secondary | ICD-10-CM | POA: Insufficient documentation

## 2014-04-28 DIAGNOSIS — Z888 Allergy status to other drugs, medicaments and biological substances status: Secondary | ICD-10-CM | POA: Diagnosis not present

## 2014-04-28 DIAGNOSIS — I951 Orthostatic hypotension: Secondary | ICD-10-CM

## 2014-04-28 DIAGNOSIS — Z91018 Allergy to other foods: Secondary | ICD-10-CM | POA: Insufficient documentation

## 2014-04-28 DIAGNOSIS — Z885 Allergy status to narcotic agent status: Secondary | ICD-10-CM | POA: Insufficient documentation

## 2014-04-28 DIAGNOSIS — Z9884 Bariatric surgery status: Secondary | ICD-10-CM

## 2014-04-28 LAB — COMPREHENSIVE METABOLIC PANEL
ALT: 22 U/L (ref 0–35)
AST: 19 U/L (ref 0–37)
Albumin: 3.3 g/dL — ABNORMAL LOW (ref 3.5–5.2)
Alkaline Phosphatase: 58 U/L (ref 39–117)
Anion gap: 4 — ABNORMAL LOW (ref 5–15)
BILIRUBIN TOTAL: 0.8 mg/dL (ref 0.3–1.2)
BUN: 15 mg/dL (ref 6–23)
CO2: 26 mmol/L (ref 19–32)
Calcium: 8.8 mg/dL (ref 8.4–10.5)
Chloride: 108 mmol/L (ref 96–112)
Creatinine, Ser: 0.63 mg/dL (ref 0.50–1.10)
GFR calc Af Amer: >90 mL/min (ref >90)
GFR calc non Af Amer: >90 mL/min (ref >90)
GLUCOSE: 79 mg/dL (ref 70–99)
Potassium: 3.6 mmol/L (ref 3.5–5.1)
Sodium: 138 mmol/L (ref 135–145)
Total Protein: 5.5 g/dL — ABNORMAL LOW (ref 6.0–8.3)

## 2014-04-28 LAB — MAGNESIUM: Magnesium: 1.8 mg/dL (ref 1.5–2.5)

## 2014-04-28 LAB — PHOSPHORUS: Phosphorus: 4.2 mg/dL (ref 2.3–4.6)

## 2014-04-28 MED ORDER — ONDANSETRON HCL 4 MG/2ML IJ SOLN: 4.0000 mg | Freq: Four times a day (QID) | INTRAMUSCULAR | Status: DC | PRN

## 2014-04-28 MED ORDER — ONDANSETRON HCL 4 MG PO TABS: 4.0000 mg | ORAL_TABLET | Freq: Four times a day (QID) | ORAL | Status: DC | PRN

## 2014-04-28 MED ORDER — MIRTAZAPINE 15 MG PO TABS: 15.0000 mg | ORAL_TABLET | Freq: Every day | ORAL | Status: DC

## 2014-04-28 MED ORDER — ACETAMINOPHEN 325 MG PO TABS: 650.0000 mg | ORAL_TABLET | Freq: Four times a day (QID) | ORAL | Status: DC | PRN

## 2014-04-28 MED ORDER — ACETAMINOPHEN 650 MG RE SUPP: 650.0000 mg | Freq: Four times a day (QID) | RECTAL | Status: DC | PRN

## 2014-04-28 MED ORDER — DULOXETINE HCL 20 MG PO CPEP: 20.0000 mg | ORAL_CAPSULE | Freq: Every day | ORAL | Status: DC

## 2014-04-28 MED ORDER — SODIUM CHLORIDE 0.9 % IV SOLN
INTRAVENOUS | Status: DC
  Administered 2014-04-28 – 2014-04-29 (×2): via INTRAVENOUS
  Administered 2014-04-29: 1000 mL via INTRAVENOUS

## 2014-04-28 MED ORDER — SODIUM CHLORIDE 0.9 % IJ SOLN
3.0000 mL | Freq: Two times a day (BID) | INTRAMUSCULAR | Status: DC
  Administered 2014-04-28 – 2014-04-29 (×2): 3 mL via INTRAVENOUS

## 2014-04-28 MED ORDER — HEPARIN SODIUM (PORCINE) 5000 UNIT/ML IJ SOLN
5000.0000 [IU] | Freq: Three times a day (TID) | INTRAMUSCULAR | Status: DC
  Administered 2014-04-28 – 2014-04-29 (×3): 5000 [IU] via SUBCUTANEOUS
  Filled 2014-04-28 (×4): qty 1

## 2014-04-28 MED ORDER — FREESTYLE SYSTEM KIT: 1.0000 | PACK | Status: DC | PRN

## 2014-04-28 NOTE — Telephone Encounter (Signed)
Judeth CornfieldStephanie called wanting Sarah LennertSarah Estes to know she is coming in today to see her.
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
## 2014-04-28 NOTE — H&P (Signed)
[facility] Admission History and Physical Service Pager: [phone]  Patient name: Sarah Estes Medical record number: [id number] Date of birth: January 17, 1991 Age: 24 y.o. Gender: female  Primary Care Provider: Imelda Pillow, NP Consultants: none Code Status: Full  Chief Complaint: dizzy  Assessment and Plan: Sarah Estes is a 24 y.o. female presenting with dizziness, dehydration. PMH is significant for anorexia nervosa, gastric bypass surgery, anxiety/depression  # Dehydration: orthostatic vitals in clinic today, admitted for concern of electrolyte imbalance with rehydration. - observation, tele overnight - labs below - orthostatic vital signs now, repeat tomorrow AM - EKG now, repeat tomorrow AM  # Anorexia: wt 132lbs on admission - Cmet, mg, phos, CBC - consult to nutrition to help with diet plan - ok for parents to bring in meal supplements  # Anxiety/depression/PTSD:  - continue home prozac tomorrow, consider overlapping cymbalta if stable tomorrow or day after if still in hospital  FEN/GI: diet reg / NS @ 100cc/hr Prophylaxis: hep sq  Disposition: observation  History of Present Illness: Sarah Estes is a 24 y.o. female presenting from [facility] for dehydration and orthostatic vital signs. PMH significant for anorexia nervosa and gastric bypass. Pt states she went to her weekly weight re-check. She says she currently feels okay, but does complain of dizziness that is noticed when laying/sitting to standing. She denies any CP, SOB, heart racing or palpitations, nausea/vomiting, diarrhea/constipation, dysuria or polyuria. She is on a diet regimen which includes a goal of 48oz of water a day; reports having 36oz today but not much the 2 days prior to yesterday.   Review Of Systems: Per HPI with the following additions: none Otherwise 12 point review of systems was performed and was unremarkable.  Patient Active Problem  List   Diagnosis Date Noted  . Dehydration 04/28/2014  . PTSD (post-traumatic stress disorder) 04/07/2014  . Anorexia nervosa 03/26/2014  . GAD (generalized anxiety disorder) 03/26/2014  . ADD (attention deficit disorder) 03/26/2014  . S/P gastric bypass 03/26/2014  . Pseudotumor cerebri 07/08/2012   Past Medical History: Past Medical History  Diagnosis Date  . Endometriosis   . Adult ADHD   . Anxiety and depression   . Pseudotumor cerebri 2011  . H/O gastric bypass   . Allergy   . Anxiety   . Asthma   . Depression   . Anorexia    Past Surgical History: Past Surgical History  Procedure Laterality Date  . Colonoscopy    . Lumbar puncture    . Laparoscopic endometriosis fulguration    . Cosmetic surgery    . Gastric bypass     Social History: History  Substance Use Topics  . Smoking status: Never Smoker   . Smokeless tobacco: Never Used  . Alcohol Use: 0.0 oz/week    0 Not specified per week     Comment: Socially   Additional social history: none  Please also refer to relevant sections of EMR.  Family History: Family History  Problem Relation Age of Onset  . Heart disease Maternal Grandfather   . Hypertension Maternal Grandfather   . Stroke Maternal Grandfather   . Diabetes Father   . Hypertension Father   . Hypertension Paternal Grandmother   . Mental retardation Paternal Grandmother   . Diabetes Paternal Grandfather   . Hyperlipidemia Paternal Grandfather   . Hypertension Paternal Grandfather    Allergies and Medications: Allergies  Allergen Reactions  . Corticosteroids Other (See Comments)    Injections cannot take due  to gastric surgery (creams or tablets?)  . Gluten Meal Nausea Only and Other (See Comments)    Exacerbates endometriosis symptoms    . Nsaids Other (See Comments)    Had gastric bypass surgery   . Sulfa Antibiotics Hives, Itching and Rash  . Morphine And Related Itching   No current facility-administered medications on file prior  to encounter.   Current Outpatient Prescriptions on File Prior to Encounter  Medication Sig Dispense Refill  . clonazePAM (KLONOPIN) 1 MG tablet 2 po tid during the day, up to 3 po qphs 126 tablet 0  . cyanocobalamin (,VITAMIN B-12,) 1000 MCG/ML injection Inject 1,000 mcg into the muscle every 30 (thirty) days.    Marland Kitchen docusate sodium (COLACE) 100 MG capsule Take 3 capsules (300 mg total) by mouth daily. 90 capsule 0  . DULoxetine (CYMBALTA) 20 MG capsule Take 1 capsule (20 mg total) by mouth daily. 30 capsule 0  . FLUoxetine (PROZAC) 20 MG capsule Take 3 capsules (60 mg total) by mouth daily. 180 capsule 0  . FLUoxetine (PROZAC) 40 MG capsule Take 3 capsules (120 mg total) by mouth daily. 270 capsule 0  . glucose monitoring kit (FREESTYLE) monitoring kit 1 each by Does not apply route as needed for other. 1 each 0  . levonorgestrel (MIRENA) 20 MCG/24HR IUD 1 each by Intrauterine route continuous.    . mirtazapine (REMERON) 15 MG tablet Take 1 tablet (15 mg total) by mouth at bedtime. 30 tablet 0  . polyethylene glycol powder (MIRALAX) powder Take 17 g by mouth once. 225 g 0  . traZODone (DESYREL) 50 MG tablet Take 1-2 tablets (50-100 mg total) by mouth at bedtime as needed for sleep. 60 tablet 3  . Vitamin D, Ergocalciferol, (DRISDOL) 50000 UNITS CAPS capsule Take 100,000 Units by mouth every 7 (seven) days.      Objective: BP 96/55 mmHg  Pulse 61  Temp(Src) 97.5 F (36.4 C) (Oral)  Resp 14  Ht $R'5\' 9"'Gu$  (1.753 m)  Wt 132 lb (59.875 kg)  BMI 19.48 kg/m2  SpO2 99% Exam: General: NAD, pleasant HEENT: PERRL, EOMI, mucous membranes dry Cardiovascular: regular rhythm, rate regular borderline brady, normal s1s2, no m/r/g, 2+ radial and PT pulses bilaterally Respiratory: clear bilaterally Abdomen: very thin/scaphoid, soft, nontender, normal bowel sounds Extremities: no edema or cyanosis Skin: several ecchymosis lower calves bilaterally, largest approx 8cm diameter Neuro: alert and oriented, no  focal deficits  Labs and Imaging: CBC BMET  No results for input(s): WBC, HGB, HCT, PLT in the last 168 hours. No results for input(s): NA, K, CL, CO2, BUN, CREATININE, GLUCOSE, CALCIUM in the last 168 hours.     Sarah Brand, MD 04/28/2014, 9:13 PM PGY-2, [facility] Intern pager: [phone], text pages welcome

## 2014-04-28 NOTE — Telephone Encounter (Signed)
Pt called to state that she is coming in tonight.

## 2014-04-28 NOTE — Telephone Encounter (Signed)
Can we please call and check on this patient.  I just got this message and saw that she did not go to the ED.  I also want to make sure she is going to come and see me today.

## 2014-04-28 NOTE — Telephone Encounter (Signed)
LMOM to CB. 

## 2014-04-28 NOTE — Patient Instructions (Addendum)
Pt is to be direct admit to 3E -- Dr Cliffton AstersWhite and the floor nurse have been notified.  Start Cymbalta - this must be taken in the middle of the meal.  Ok to decrease your Prozac to 20mg  a day.  We are going to change your Klonopin dose to more during the morning and nothing after 2 pm due to your grogginess in the evening.  You will see me when you get out of the hospital.

## 2014-04-29 ENCOUNTER — Telehealth: Payer: Self-pay | Admitting: Physician Assistant

## 2014-04-29 DIAGNOSIS — F5000 Anorexia nervosa, unspecified: Secondary | ICD-10-CM | POA: Diagnosis not present

## 2014-04-29 DIAGNOSIS — I951 Orthostatic hypotension: Secondary | ICD-10-CM | POA: Diagnosis not present

## 2014-04-29 DIAGNOSIS — F411 Generalized anxiety disorder: Secondary | ICD-10-CM | POA: Diagnosis not present

## 2014-04-29 DIAGNOSIS — E860 Dehydration: Secondary | ICD-10-CM | POA: Diagnosis not present

## 2014-04-29 DIAGNOSIS — Z9884 Bariatric surgery status: Secondary | ICD-10-CM

## 2014-04-29 LAB — CBC
HEMATOCRIT: 34.8 % — AB (ref 36.0–46.0)
HEMOGLOBIN: 12.1 g/dL (ref 12.0–15.0)
MCH: 32.4 pg (ref 26.0–34.0)
MCHC: 34.8 g/dL (ref 30.0–36.0)
MCV: 93 fL (ref 78.0–100.0)
Platelets: 203 10*3/uL (ref 150–400)
RBC: 3.74 MIL/uL — AB (ref 3.87–5.11)
RDW: 12.6 % (ref 11.5–15.5)
WBC: 5.1 10*3/uL (ref 4.0–10.5)

## 2014-04-29 LAB — BASIC METABOLIC PANEL
Anion gap: 6 (ref 5–15)
BUN: 15 mg/dL (ref 6–23)
CO2: 25 mmol/L (ref 19–32)
CREATININE: 0.7 mg/dL (ref 0.50–1.10)
Calcium: 8.4 mg/dL (ref 8.4–10.5)
Chloride: 108 mmol/L (ref 96–112)
Glucose, Bld: 100 mg/dL — ABNORMAL HIGH (ref 70–99)
Potassium: 3.6 mmol/L (ref 3.5–5.1)
SODIUM: 139 mmol/L (ref 135–145)

## 2014-04-29 MED ORDER — SODIUM CHLORIDE 0.9 % IV BOLUS (SEPSIS)
1000.0000 mL | Freq: Once | INTRAVENOUS | Status: AC
  Administered 2014-04-29: 1000 mL via INTRAVENOUS

## 2014-04-29 MED ORDER — CLONAZEPAM 0.5 MG PO TABS
1.0000 mg | ORAL_TABLET | Freq: Once | ORAL | Status: DC
  Filled 2014-04-29: qty 2

## 2014-04-29 MED ORDER — FLUOXETINE HCL 20 MG PO CAPS
40.0000 mg | ORAL_CAPSULE | Freq: Once | ORAL | Status: AC
  Administered 2014-04-29: 40 mg via ORAL
  Filled 2014-04-29: qty 2

## 2014-04-29 MED ORDER — GLUCERNA SHAKE PO LIQD
237.0000 mL | Freq: Two times a day (BID) | ORAL | Status: DC
  Administered 2014-04-29: 237 mL via ORAL

## 2014-04-29 MED ORDER — GLUCERNA PO LIQD
237.0000 mL | Freq: Every morning | ORAL | Status: DC
  Administered 2014-04-29: 237 mL via ORAL

## 2014-04-29 MED ORDER — TRAZODONE HCL 50 MG PO TABS
50.0000 mg | ORAL_TABLET | Freq: Every evening | ORAL | Status: DC | PRN
  Filled 2014-04-29: qty 2

## 2014-04-29 MED ORDER — GLUCERNA SHAKE PO LIQD: 237.0000 mL | Freq: Every day | ORAL | Status: DC

## 2014-04-29 MED ORDER — BLOOD GLUCOSE TEST VI STRP: ORAL_STRIP | Status: DC
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
## 2014-04-29 NOTE — Progress Notes (Signed)
UR completed 

## 2014-04-29 NOTE — Discharge Instructions (Signed)
You were admitted for orthostatic hypotension and dehydration.  It is important to have good fluid intake at home.  Your electrolytes were all normal. Your blood pressure is now doing better.  Talk to Dr. Johna Sheriff and your PCP about possible NG Tube placement for short-term nutritional supplementation.   Orthostatic Hypotension Orthostatic hypotension is a sudden drop in blood pressure. It happens when you quickly stand up from a seated or lying position. You may feel dizzy or light-headed. This can last for just a few seconds or for up to a few minutes. It is usually not a serious problem. However, if this happens frequently or gets worse, it can be a sign of something more serious. CAUSES  Different things can cause orthostatic hypotension, including:   Loss of body fluids (dehydration).  Medicines that lower blood pressure.  Sudden changes in posture, such as standing up quickly after you have been sitting or lying down.  Taking too much of your medicine. SIGNS AND SYMPTOMS   Light-headedness or dizziness.   Fainting or near-fainting.   A fast heart rate.   Weakness.   Feeling tired (fatigue).  DIAGNOSIS  Your health care provider may do several things to help diagnose your condition and identify the cause. These may include:   Taking a medical history and doing a physical exam.  Checking your blood pressure. Your health care provider will check your blood pressure when you are:  Lying down.  Sitting.  Standing.  Using tilt table testing. In this test, you lie down on a table that moves from a lying position to a standing position. You will be strapped onto the table. This test monitors your blood pressure and heart rate when you are in different positions. TREATMENT  Treatment will vary depending on the cause. Possible treatments include:   Changing the dosage of your medicines.  Wearing compression stockings on your lower legs.  Standing up slowly after  sitting or lying down.  Eating more salt.  Eating frequent, small meals.  In some cases, getting IV fluids.  Taking medicine to enhance fluid retention. HOME CARE INSTRUCTIONS  Only take over-the-counter or prescription medicines as directed by your health care provider.  Follow your health care provider's instructions for changing the dosage of your current medicines.  Do not stop or adjust your medicine on your own.  Stand up slowly after sitting or lying down. This allows your body to adjust to the different position.  Wear compression stockings as directed.  Eat extra salt as directed.  Do not add extra salt to your diet unless directed to by your health care provider.  Eat frequent, small meals.  Avoid standing suddenly after eating.  Avoid hot showers or excessive heat as directed by your health care provider.  Keep all follow-up appointments. SEEK MEDICAL CARE IF:  You continue to feel dizzy or light-headed after standing.  You feel groggy or confused.  You feel cold, clammy, or sick to your stomach (nauseous).  You have blurred vision.  You feel short of breath. SEEK IMMEDIATE MEDICAL CARE IF:   You faint after standing.  You have chest pain.  You have difficulty breathing.   You lose feeling or movement in your arms or legs.   You have slurred speech or difficulty talking, or you are unable to talk.  MAKE SURE YOU:   Understand these instructions.  Will watch your condition.  Will get help right away if you are not doing well or get worse. Document  Released: 03/10/2002 Document Revised: 03/25/2013 Document Reviewed: 01/10/2013 [facility] Patient Information 2015 [employer], [place]. This information is not intended to replace advice given to you by your health care provider. Make sure you discuss any questions you have with your health care provider.

## 2014-04-29 NOTE — Telephone Encounter (Signed)
Patient states that a glucose monitoring meter was sent to her pharmacy but she needs the test strips to go with it. [facility]. Patient also states that she is being discharged from the hospital today as well.  [phone]

## 2014-04-29 NOTE — Progress Notes (Signed)
Pt's mother requesting to know what dietitian talked to her daughter about feeding this am. As pt wants her to talk with about her feedings.  Reanne with dietary informed and went to speak with pt's mother.  Will continue to monitor.  Amanda PeaNellie Elianah Karis, Charity fundraiserN.
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
## 2014-04-29 NOTE — Progress Notes (Signed)
Family Medicine Teaching Service Daily Progress Note Intern Pager: [phone]  Patient name: Sarah Estes Medical record number: [id number] Date of birth: 06/25/1990 Age: 24 y.o. Gender: female  Primary Care Provider: Egbert GaribaldiMillsaps, KIMBERLY M, NP Consultants: none Code Status: full  Pt Overview and Major Events to Date:  1/26 - admit for dehydration  Assessment and Plan: Sarah Estes is a 24 y.o. female presenting with dizziness, dehydration. PMH is significant for anorexia nervosa, gastric bypass surgery, anxiety/depression  # Dehydration: orthostatic vitals in clinic 1/26, admitted for concern of electrolyte imbalance with rehydration. - observation, tele overnight Orthostatic VS for the past 24 hrs:  BP- Lying Pulse- Lying BP- Sitting Pulse- Sitting BP- Standing at 0 minutes Pulse- Standing at 0 minutes  04/29/14 0449 96/52 mmHg 71 (!) 86/56 mmHg 79 100/57 mmHg 79  04/28/14 2123 94/50 mmHg 56 103/68 mmHg 67 111/68 mmHg 62   - EKG NSR - Will bolus 1L NS  # Anorexia: wt 132lbs on admission - Albumin 3.3, but electrolytes otherwise wnl - consult to nutrition to help with diet plan - ok for parents to bring in meal supplements - SW consult for assistance finding eating disorders resources  # Anxiety/depression/PTSD:  - continue home prozac, consider overlapping cymbalta if stable  FEN/GI: diet reg / NS @ 100cc/hr Prophylaxis: hep sq  Disposition: Home later today  Subjective:  Feeling back to recent baseline.  Still dizzy but improved from admission  Objective: Temp:  [97.5 F (36.4 C)-97.8 F (36.6 C)] 97.7 F (36.5 C) (01/27 0449) Pulse Rate:  [60-85] 71 (01/27 0449) Resp:  [12-18] 18 (01/27 0449) BP: (86-96)/(48-58) 96/52 mmHg (01/27 0449) SpO2:  [99 %] 99 % (01/27 0449) Weight:  [132 lb (59.875 kg)-136 lb 1.6 oz (61.735 kg)] 136 lb 1.6 oz (61.735 kg) (01/27 0449) Physical Exam: General: NAD, pleasant, sitting in bed HEENT: PERRL, EOMI,  MMM Cardiovascular: RRR, no m/r/g, 2+ radial and PT pulses bilaterally Respiratory: clear bilaterally Abdomen: very thin/scaphoid, soft, nontender, normal bowel sounds Extremities: no edema or cyanosis Neuro: alert and oriented, no focal deficits  Laboratory:  Recent Labs Lab 04/29/14 0335  WBC 5.1  HGB 12.1  HCT 34.8*  PLT 203    Recent Labs Lab 04/28/14 2215 04/29/14 0335  NA 138 139  K 3.6 3.6  CL 108 108  CO2 26 25  BUN 15 15  CREATININE 0.63 0.70  CALCIUM 8.8 8.4  PROT 5.5*  --   BILITOT 0.8  --   ALKPHOS 58  --   ALT 22  --   AST 19  --   GLUCOSE 79 100*     Shirlee LatchAngela Bacigalupo, MD 04/29/2014, 7:24 AM PGY-1, [facility] FPTS Intern pager: [phone], text pages welcome

## 2014-04-29 NOTE — Telephone Encounter (Signed)
Sent glucose test strips in to her pharmacy. Advised her mom this was done and relay message to pt.

## 2014-04-29 NOTE — Progress Notes (Signed)
Spoke with nutritionist around 2pm.  Voiced concern for patient's nutritional status.  Recommended NGT placement while inpatient or as OP for supplemental feeding.  Was concerned after family told her that patient had problems with heart from anorexia.  Explained that patient is medically stable without heart problems.  She was admitted for dehydration.  Orthostasis has resolved and electrolytes wnl.  Spoke with Dr. Lum BabeEniola and the family.  Everyone ok with plan to discharge with follow-up with GI and PCP tomorrow.  GI can discuss possible OP NGT placement for short term nutrition supplementation at appt tomorrow.  Shirlee LatchAngela Rhen Dossantos, MD, MPH PGY-1,  [facility] 04/29/2014 2:27 PM

## 2014-04-29 NOTE — Progress Notes (Signed)
All d/c instructions explained as given.  Pt verbalized understanding.  D/c off floor via w/c by NT to awaiting vehicle.  Escorted by family member/friend.  Amanda PeaNellie Aleigh Grunden, Charity fundraiserN.

## 2014-04-29 NOTE — Discharge Summary (Signed)
[facility] Discharge Summary  Patient name: Sarah Estes Medical record number: [id number] Date of birth: [birth year] Age: 24 y.o. Gender: female Date of Admission: 04/28/2014  Date of Discharge: 04/29/2014 Admitting Physician: Blane Ohara McDiarmid, MD  Primary Care Provider: Imelda Pillow, NP Consultants: none  Indication for Hospitalization: dehydration, orthostatic hypotension  Discharge Diagnoses/Problem List:  Dehydration Orthostatic hypotension Anorexia nervosa S/p gastric bypass Anxiety Depression PTSD  Disposition: home  Discharge Condition: stable  Discharge Exam: see progress ntoe from day of discharge Orthostatic VS for the past 24 hrs:  BP- Lying Pulse- Lying BP- Sitting Pulse- Sitting BP- Standing at 0 minutes Pulse- Standing at 0 minutes  04/29/14 0800 108/63 mmHg 62 105/66 mmHg 70 105/65 mmHg 73  04/29/14 0449 96/52 mmHg 71 (!) 86/56 mmHg 79 100/57 mmHg 79  04/28/14 2123 94/50 mmHg 56 103/68 mmHg 67 111/68 mmHg 62     Brief Hospital Course:  Sarah Estes is a 24 y.o. female presenting with dizziness, dehydration. PMH is significant for anorexia nervosa, gastric bypass surgery, anxiety/depression  # Dehydration, Orthostatic hypotension: Patient admitted from clinic after found to be orthostatic and concern from PCP of electrolyte imbalance with rehydration in setting of anorexia nervosa.  Patient was given IV hydration and observed on telemetry without events overnight.  She was given additional 1L NS bolus prior to discharge. Orthostatic vitals (see above) improved prior to discharge.  Electrolytes, including Mg and Phos, wnl throughout admission.  EKG NSR and nonischemic.  # Anorexia Nervosa: Nutrition consulted on admission.  Saw patient prior to discharge and recommended NGT placement for short term nutrition (1-2 months). "Recommend providing Glucerna 1.2. Provide 1 Can (237 ml) of Glucerna 1.2 TID during the day.  Provide nocturnal tube feeds- infuse Glucerna 1.2 @ 80 ml/hr for 10 hours overnight. Provide 100 ml free water flushes 6 times daily."  Discussed with nutrition and family.  Patient prefers to be discharged to f/u with GI and PCP on day after discharge.  She would like to see what the gastroenterologist recommends.  She was not interested in placement into inpatient eating disorders unit.  SW and CM also available during admission to provide resources.  All other chronic medical conditions stable throughout admission and managed with home regimens.  Issues for Follow Up:  - recommend nutrition and psych f/u for eating disorder - Consider NGT placement per nutrition recommendations for short-term supplementation - see above rec from nutrition - f/u hydration - recommend following Mg, Phos, and K with repletion as necessary  Significant Procedures: none  Significant Labs and Imaging:   Recent Labs Lab 04/29/14 0335  WBC 5.1  HGB 12.1  HCT 34.8*  PLT 203    Recent Labs Lab 04/28/14 2215 04/29/14 0335  NA 138 139  K 3.6 3.6  CL 108 108  CO2 26 25  GLUCOSE 79 100*  BUN 15 15  CREATININE 0.63 0.70  CALCIUM 8.8 8.4  MG 1.8  --   PHOS 4.2  --   ALKPHOS 58  --   AST 19  --   ALT 22  --   ALBUMIN 3.3*  --     Mg - 1.8 Phos - 4.2  EKG NSR   Results/Tests Pending at Time of Discharge: none  Discharge Medications:    Medication List    STOP taking these medications        traZODone 50 MG tablet  Commonly known as:  DESYREL      TAKE these  medications        clonazePAM 1 MG tablet  Commonly known as:  KLONOPIN  2 po tid during the day, up to 3 po qphs     cyanocobalamin 1000 MCG/ML injection  Commonly known as:  (VITAMIN B-12)  Inject 1,000 mcg into the muscle every 30 (thirty) days.     docusate sodium 100 MG capsule  Commonly known as:  COLACE  Take 3 capsules (300 mg total) by mouth daily.     DULoxetine 20 MG capsule  Commonly known as:  CYMBALTA   Take 1 capsule (20 mg total) by mouth daily.     FLUoxetine 20 MG capsule  Commonly known as:  PROZAC  Take 3 capsules (60 mg total) by mouth daily.     glucose monitoring kit monitoring kit  1 each by Does not apply route as needed for other.     levonorgestrel 20 MCG/24HR IUD  Commonly known as:  MIRENA  1 each by Intrauterine route continuous.     mirtazapine 15 MG tablet  Commonly known as:  REMERON  Take 1 tablet (15 mg total) by mouth at bedtime.     polyethylene glycol powder powder  Commonly known as:  MIRALAX  Take 17 g by mouth once.     Vitamin D (Ergocalciferol) 50000 UNITS Caps capsule  Commonly known as:  DRISDOL  Take 50,000 Units by mouth every Saturday.      ASK your doctor about these medications        BLOOD GLUCOSE TEST STRIPS Strp  Use as directed.     Coenzyme Q10 200 MG capsule  Take 400 mg by mouth daily.        Discharge Instructions: Please refer to Patient Instructions section of EMR for full details.  Patient was counseled important signs and symptoms that should prompt return to medical care, changes in medications, dietary instructions, activity restrictions, and follow up appointments.   Follow-Up Appointments: Follow-up Information    Follow up with Imelda Pillow, NP On 05/06/2014.   Why:  Wednesday @ 9:00 AM For hospital follow-up   Contact information:   [facility] [address] [place] [address] [phone]       Follow up with Edward Jolly, MD On 04/30/2014.   Specialty:  General Surgery   Why:  For hospital follow-up   Contact information:   [address] [place] [address] [phone]       Lavon Paganini, MD 04/29/2014, 2:48 PM PGY-1, [facility]

## 2014-04-29 NOTE — Progress Notes (Signed)
Pt requesting for Glucerna drink in am as pt does not eat much, her mother states.  Also gluten free diet and clonopin that she take regularly.  Dr. Youlanda RoysPheops informed and instructed she will look at pt's chart.  Amanda PeaNellie Hulbert Branscome, Charity fundraiserN.

## 2014-04-29 NOTE — Progress Notes (Signed)
I spoke with Dr Johna SheriffHoxworth from [facility]. He is treating patient for her weight loss. I discuss the issue of NGT placement with him since he sees patient tomorrow at 10.45am. He stated he will reassess Sarah Estes for NGT placement and if needed will get it done by the IR.  Dr Johna SheriffHoxworth. Phone [phone].

## 2014-04-30 ENCOUNTER — Encounter: Payer: Self-pay | Admitting: Physician Assistant

## 2014-04-30 ENCOUNTER — Telehealth: Payer: Self-pay | Admitting: Physician Assistant

## 2014-04-30 ENCOUNTER — Telehealth (INDEPENDENT_AMBULATORY_CARE_PROVIDER_SITE_OTHER): Payer: Self-pay | Admitting: Surgery

## 2014-04-30 ENCOUNTER — Other Ambulatory Visit (INDEPENDENT_AMBULATORY_CARE_PROVIDER_SITE_OTHER): Payer: Self-pay

## 2014-04-30 ENCOUNTER — Other Ambulatory Visit: Payer: Self-pay | Admitting: Radiology

## 2014-04-30 ENCOUNTER — Other Ambulatory Visit (HOSPITAL_COMMUNITY): Payer: Self-pay | Admitting: General Surgery

## 2014-04-30 DIAGNOSIS — Z9884 Bariatric surgery status: Secondary | ICD-10-CM

## 2014-04-30 DIAGNOSIS — F5000 Anorexia nervosa, unspecified: Secondary | ICD-10-CM

## 2014-04-30 DIAGNOSIS — R627 Adult failure to thrive: Secondary | ICD-10-CM

## 2014-04-30 DIAGNOSIS — R630 Anorexia: Secondary | ICD-10-CM

## 2014-04-30 MED FILL — Nutritional Supplement Liquid: ORAL | Qty: 237 | Status: AC

## 2014-04-30 NOTE — Telephone Encounter (Signed)
I called and spoke with Dr Johna SheriffHoxworth - He will evaluate her on health in regards to her surgery and possible problems related to that.  From reviewing her notes he believes that her problem is not anatomical but rather pyschological.

## 2014-04-30 NOTE — Telephone Encounter (Signed)
I spoke with Sarah Estes (patient's mom) and discussed with her my concern about an NG tube placement for nutrition concerns (refeeding) and concerns that this will not help with the disease treatment.  Mom is in agreement that an NG tube is not a good idea and plans to cancel the appointment tomorrow and not allow her to have it placed.  Mom wants to give the patient 3 days to change her habits and is planning on not letting the patient out of her sight for the next several weeks.  Mom is concerned about her health and that she might die if she continues how she is eating but mom is concerned that she will commit suicide if she is placed inpatient esp because she is not able to take Kendal HymenBonnie (the service dog).  They plan on seeing me in 48h.  I spoke with Sarah Estes - who has told the patient she is no longer able to see her due to her concerns about her health.  She is more than willing to help her find an inpatient program and help her get started but she cannot be part of her outpatient treatment.  My recommendation at this time is inpatient treatment.

## 2014-04-30 NOTE — Telephone Encounter (Signed)
Patient who is having trouble post gastric bypass.  It sounds like there is a lot of psychological issues.  The patient saw Dr. Johna SheriffHoxworth earlier today in our office and suggested a feeding tube.  The mother called to say she is not getting a feeding tube placed tomorrow.  I was operating at the time of the message.  I tried to call the mother later, but only got the answering machine.  It looks like I am the 5th phone call today on Ms. Zupko.  Ovidio Kinavid Randell Teare, MD, [facility] Pager: [phone] Office phone:  [phone]

## 2014-04-30 NOTE — Telephone Encounter (Signed)
Noted  
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
## 2014-05-01 ENCOUNTER — Ambulatory Visit (HOSPITAL_COMMUNITY): Admission: RE | Admit: 2014-05-01 | Payer: No Typology Code available for payment source | Source: Ambulatory Visit

## 2014-05-01 ENCOUNTER — Encounter: Payer: Self-pay | Admitting: Physician Assistant

## 2014-05-02 ENCOUNTER — Ambulatory Visit (INDEPENDENT_AMBULATORY_CARE_PROVIDER_SITE_OTHER): Payer: BC Managed Care – PPO | Admitting: Physician Assistant

## 2014-05-02 ENCOUNTER — Encounter: Payer: Self-pay | Admitting: Physician Assistant

## 2014-05-02 VITALS — BP 104/60 | HR 89 | Temp 97.5°F | Resp 16 | Ht 69.0 in

## 2014-05-02 DIAGNOSIS — E43 Unspecified severe protein-calorie malnutrition: Secondary | ICD-10-CM

## 2014-05-02 DIAGNOSIS — E41 Nutritional marasmus: Secondary | ICD-10-CM

## 2014-05-02 DIAGNOSIS — Z9884 Bariatric surgery status: Secondary | ICD-10-CM

## 2014-05-02 DIAGNOSIS — F411 Generalized anxiety disorder: Secondary | ICD-10-CM

## 2014-05-02 DIAGNOSIS — F5000 Anorexia nervosa, unspecified: Secondary | ICD-10-CM

## 2014-05-02 LAB — POCT CBC
Granulocyte percent: 66 %G (ref 37–80)
HCT, POC: 39.7 % (ref 37.7–47.9)
Hemoglobin: 12.8 g/dL (ref 12.2–16.2)
LYMPH, POC: 2.1 (ref 0.6–3.4)
MCH, POC: 32.1 pg — AB (ref 27–31.2)
MCHC: 32.4 g/dL (ref 31.8–35.4)
MCV: 99.1 fL — AB (ref 80–97)
MID (cbc): 0.3 (ref 0–0.9)
MPV: 7.6 fL (ref 0–99.8)
POC GRANULOCYTE: 4.8 (ref 2–6.9)
POC LYMPH PERCENT: 29.4 %L (ref 10–50)
POC MID %: 4.6 % (ref 0–12)
Platelet Count, POC: 218 10*3/uL (ref 142–424)
RBC: 4 M/uL — AB (ref 4.04–5.48)
RDW, POC: 13.3 %
WBC: 7.3 10*3/uL (ref 4.6–10.2)

## 2014-05-02 LAB — POCT URINALYSIS DIPSTICK
BILIRUBIN UA: NEGATIVE
Blood, UA: NEGATIVE
GLUCOSE UA: NEGATIVE
Ketones, UA: NEGATIVE
Nitrite, UA: NEGATIVE
PROTEIN UA: NEGATIVE
SPEC GRAV UA: 1.015
Urobilinogen, UA: 0.2
pH, UA: 5.5

## 2014-05-02 MED ORDER — ALPRAZOLAM 0.25 MG PO TABS: 0.1250 mg | ORAL_TABLET | Freq: Four times a day (QID) | ORAL | Status: DC | PRN

## 2014-05-02 NOTE — Progress Notes (Signed)
Subjective:    Patient ID: Tangia Pinard, female    DOB: 15-Aug-1990, 24 y.o.   MRN: [id number]  HPI Pt presents to clinic for a recheck after her hospitalization last week.  She feels terrible for lying to her medical professionals about her continued use of laxatives and diuretics (this would explain why she stated she had eaten more over the last 10day but her weight decrease by 7 lbs and her vital were bad).  She states she is scared of what her eating disorder (which she now routinely calls "Ed" and "Ana") is doing to her and she feels like she is not in control of her actions due to her disease.  She states that since Thursday she has not been left alone at home and she is being followed by an adult in her home and she is not allowed to be in the bathroom with the door closed.  She states and her mother agrees that yesterday she ate and drank better than she has in many months.  Today it has not been as good.  Her mother states that yesterday she saw her old daughter a little today it has been less.  Mother does not currently feel that her daughter is ready mentally for inpatient treatment and is really worried about what inpatient will do to her.  Mother is worried about the lack of family support that she will have inpatient and mother is afraid she will commit suicide if she is forced into inpatient care.  [name] does not feel like the Klonopin is helping with her mealtime anxiety and would like to try Xanax which in the past prior to her gastric bypass would make her sleepy but she recognizes that her medication tolerance has changed since her surgery.  She has taken the Remeron for the last 3 nights and the night terrors have been less and they are also less severe.  She feels her sleep is better since starting the Remeron.  Her mom is giving her what ever she wants to eat because eating anything is better than eating nothing.  Her food interests have changed several times over the last  several weeks which is to be expected.    Since her hospitalization she has less dizziness.  Review of Systems     Objective:   Physical Exam  Constitutional: She is oriented to person, place, and time. She appears well-developed. She is cooperative. She has a sickly appearance.  BP 104/60 mmHg  Pulse 89  Temp(Src) 97.5 F (36.4 C) (Oral)  Resp 16  Ht  (1.753 m)  SpO2 100%  140 lb 6.4 oz (63.685 kg)  HENT:  Head: Normocephalic and atraumatic.  Right Ear: External ear normal.  Left Ear: External ear normal.  Pulmonary/Chest: Effort normal.  Neurological: She is alert and oriented to person, place, and time.  Skin: Skin is warm and dry.  Psychiatric: She has a normal mood and affect. Her behavior is normal. Judgment and thought content normal.    Results for orders placed or performed in visit on 05/02/14  POCT CBC  Result Value Ref Range   WBC 7.3 4.6 - 10.2 K/uL   Lymph, poc 2.1 0.6 - 3.4   POC LYMPH PERCENT 29.4 10 - 50 %L   MID (cbc) 0.3 0 - 0.9   POC MID % 4.6 0 - 12 %M   POC Granulocyte 4.8 2 - 6.9   Granulocyte percent 66.0 37 - 80 %G   RBC  4.00 (A) 4.04 - 5.48 M/uL   Hemoglobin 12.8 12.2 - 16.2 g/dL   HCT, POC 16.1 09.6 - 47.9 %   MCV 99.1 (A) 80 - 97 fL   MCH, POC 32.1 (A) 27 - 31.2 pg   MCHC 32.4 31.8 - 35.4 g/dL   RDW, POC 04.5 %   Platelet Count, POC 218 142 - 424 K/uL   MPV 7.6 0 - 99.8 fL  POCT urinalysis dipstick  Result Value Ref Range   Color, UA YELLOW    Clarity, UA HAZY    Glucose, UA NEG    Bilirubin, UA NEG    Ketones, UA NEG    Spec Grav, UA 1.015    Blood, UA NEG    pH, UA 5.5    Protein, UA NEG    Urobilinogen, UA 0.2    Nitrite, UA NEG    Leukocytes, UA small (1+)    Spent 1.5 hours with patient in the room with >50% spent in counseling     Assessment & Plan:  Anorexia nervosa - Pt is metabolically stable at this time.  I do believe that the patient would significantly benefit from inpatient care but she is not willing to  go voluntarily at this time and I do not believe she is metabolically at a state where I can involuntarily commit her.  We will check labs again today because her albumin was slightly low at 3.3 in the hospital.  She is more hydrate today as seen in there UA.  She must actively be seeking out a therapist who specialized in eating disorders and she has been given the name of Ed Lurey and she is to call him and get an appt with him.  We talked at length about my concern that her disease is stronger than she is at this point and it is not a judgement but rather a fact.  We discussed what she might do if the eating disorder therapist also recommends inpatient - she state "I guess I will have to go".  I think at this time she thinks she can do it on her won and her mother is definitely not helping with that because she routinely saws in front of the patient "I am her mother and I know her best and I do not want her to die but I do not think she can handle inpatient and I do not think she needs it yet - her family can still help her because they know her and love her"  I am concerned they are in denial but this is what they want to do at this time and as long as she gets a dietitian and therapist for both PTSD and eating disorder I am willing to continue treating her with medications.  Plan: POCT CBC, COMPLETE METABOLIC PANEL WITH GFR, Phosphorus, Magnesium, POCT urinalysis dipstick, ALPRAZolam (XANAX) 0.25 MG tablet  S/P gastric bypass - she has been evaluated by Dr Johna Sheriff who states she is anatomically normal after her surgery and her malnutrition is not coming from surgical complications.  We expected this.  Severe malnutrition - she has her eating plan from her past nutritionist and we went over that.  Her adult caregiver (who is not leaving her alone at all) will monitor and track her intake so it can be reviewed.  She will actively seek another dietitian who can help with her food plan.  She will need to be  monitored closely for refeeding as her nutritional intake  has been so poor for so long.  As long as she has a dietitian I am willing to do this.  They were reminded that proteins need to be her focus and then fruits/veggies and carbs.  Small meals without water due to gastric bypass.  I think mom is finally realizing that the patient is using her gastric bypass history for many excuses with there eating habits - such as dumping and inability to eat certain foods that she should be fine eating.  GAD (generalized anxiety disorder) - increase Cymbalta to 40mg  and stop Prozac.  We are going to try Xanax to see if we can control her anxiety around food a little better.  Xanax XR is not an option due to her gastric bypass.  She will continue to see Dartyl Hyers for therapy associated with her PTSD.  She will see me on Tuesday with her appts with new care group.  Benny LennertSarah Moet Mikulski PA-C  [facility] 05/03/2014 4:05 PM

## 2014-05-02 NOTE — Patient Instructions (Addendum)
Lyanne CoEd Lurey - therapist Address: [address], [place], [place] [address]  [phone]   Nutritionist- Wyona AlmasJeannie Sykes - [facility] Address: [address], [place], [place] [address]  [phone]  Megan Hadley [address]  [place], [place], [address]  [phone]  Increase Cymbalta to 40mg  on Monday. Stop the Prozac on Monday. See me on Tuesday with your appts.  Stop the Klonopin and start the xanax - you can take up to 2mg  a day

## 2014-05-03 LAB — COMPLETE METABOLIC PANEL WITH GFR
ALK PHOS: 57 U/L (ref 39–117)
ALT: 23 U/L (ref 0–35)
AST: 17 U/L (ref 0–37)
Albumin: 3.6 g/dL (ref 3.5–5.2)
BILIRUBIN TOTAL: 0.4 mg/dL (ref 0.2–1.2)
BUN: 11 mg/dL (ref 6–23)
CALCIUM: 8.7 mg/dL (ref 8.4–10.5)
CO2: 25 mEq/L (ref 19–32)
CREATININE: 0.6 mg/dL (ref 0.50–1.10)
Chloride: 106 mEq/L (ref 96–112)
GFR, Est Non African American: >89 mL/min
Glucose, Bld: 78 mg/dL (ref 70–99)
Potassium: 3.7 mEq/L (ref 3.5–5.3)
Sodium: 139 mEq/L (ref 135–145)
TOTAL PROTEIN: 5.7 g/dL — AB (ref 6.0–8.3)

## 2014-05-03 LAB — PHOSPHORUS: PHOSPHORUS: 4.1 mg/dL (ref 2.3–4.6)

## 2014-05-03 LAB — MAGNESIUM: MAGNESIUM: 1.8 mg/dL (ref 1.5–2.5)

## 2014-05-04 ENCOUNTER — Encounter: Payer: Self-pay | Admitting: Physician Assistant

## 2014-05-05 ENCOUNTER — Ambulatory Visit (INDEPENDENT_AMBULATORY_CARE_PROVIDER_SITE_OTHER): Payer: BC Managed Care – PPO | Admitting: Physician Assistant

## 2014-05-05 VITALS — BP 106/74 | HR 93 | Temp 98.0°F | Resp 16 | Ht 68.0 in

## 2014-05-05 DIAGNOSIS — F4310 Post-traumatic stress disorder, unspecified: Secondary | ICD-10-CM

## 2014-05-05 DIAGNOSIS — F411 Generalized anxiety disorder: Secondary | ICD-10-CM

## 2014-05-05 DIAGNOSIS — F5000 Anorexia nervosa, unspecified: Secondary | ICD-10-CM

## 2014-05-05 DIAGNOSIS — E41 Nutritional marasmus: Secondary | ICD-10-CM

## 2014-05-05 DIAGNOSIS — Z9884 Bariatric surgery status: Secondary | ICD-10-CM

## 2014-05-05 DIAGNOSIS — E43 Unspecified severe protein-calorie malnutrition: Secondary | ICD-10-CM

## 2014-05-05 DIAGNOSIS — F514 Sleep terrors [night terrors]: Secondary | ICD-10-CM

## 2014-05-05 LAB — BASIC METABOLIC PANEL
BUN: 12 mg/dL (ref 6–23)
CHLORIDE: 108 meq/L (ref 96–112)
CO2: 24 mEq/L (ref 19–32)
Calcium: 8.6 mg/dL (ref 8.4–10.5)
Creat: 0.43 mg/dL — ABNORMAL LOW (ref 0.50–1.10)
Glucose, Bld: 69 mg/dL — ABNORMAL LOW (ref 70–99)
Potassium: 3.8 mEq/L (ref 3.5–5.3)
Sodium: 138 mEq/L (ref 135–145)

## 2014-05-05 LAB — POCT URINALYSIS DIPSTICK
BILIRUBIN UA: NEGATIVE
GLUCOSE UA: NEGATIVE
Ketones, UA: NEGATIVE
Leukocytes, UA: NEGATIVE
Nitrite, UA: NEGATIVE
PH UA: 5
PROTEIN UA: NEGATIVE
Spec Grav, UA: <=1.005
UROBILINOGEN UA: 1

## 2014-05-05 LAB — MAGNESIUM: MAGNESIUM: 2.1 mg/dL (ref 1.5–2.5)

## 2014-05-05 LAB — PHOSPHORUS: PHOSPHORUS: 4.1 mg/dL (ref 2.3–4.6)

## 2014-05-05 MED ORDER — ALPRAZOLAM 1 MG PO TABS: 1.0000 mg | ORAL_TABLET | ORAL | Status: DC | PRN

## 2014-05-05 MED ORDER — DULOXETINE HCL 20 MG PO CPEP: 40.0000 mg | ORAL_CAPSULE | Freq: Every day | ORAL | Status: DC

## 2014-05-05 NOTE — Progress Notes (Signed)
Subjective:    Patient ID: Sarah Estes, female    DOB: 09-Oct-1990, 24 y.o.   MRN: [id number]  HPI  Pt presents to clinic for her weekly recheck.  I last saw her 3 days ago when we had a very difficult discussion regarding her failing health and the very real possibility of her needing inpatient care for her eating disorder. Over the last 3 days she has changed her ways significantly.  She has started to eat and she states that she feels better and is more clear in the head.  It is hard every time she eats and she is only doing it to stay out of inpatient care.  She is starting to get interested in different types of food (she has not been doing Glucerna because she does not like them) but she is eating solids at least 3 times most days.  She is drinking water (before this she was not a water drinker) because she has to in order to stay out of inpatient treatment.  She is walking the dog at a slow pace for 30 a day.  She misses exercise because it helps her stress.  Meals -  Drinking at least 48 oz for the last 3 days Yogurt with protein powder in the am with cymbalta, am snack Kind granola bar, lunch - solid food with protein, sometimes afternoon snack - dinner, sometimes evening snack (depending on anxiety) -   Grazing more through the day -   She is still having significant anxiety with eating - mostly prior to eating but today she went out of to eat and felt a lot of anxiety after her eating.  The Xanax is working well for her anxiety but she does need a high dose.  She has been taking 1 mg several times a days about 30 mins before she eats - she finds that she needs more when she plans on eating at a certain time and then that changes.    Other providers -  Hyers - today - they are not going to meet until the eating disorder is under more under control  Ed Lurey 2/9 11am  Megan Hadley 2/4 2 pm  Her goals for the future drive by next week [place] trip 2/9 with ?boyfriend - to the  beach with him and plans to stay at grandparents house Then back to work Then to [place] in March  Spoke with mom - patient had a stash of laxatives outside and patient told her about them after the appointment with me 3 days ago.  Mom has set mouse traps in all her hiding locations that mom knows about and they have not been triggered in the last 3 days.  Mom states she has seen a little of her daughter the last 2 days - they have had hard moments when the eating disorder has won but she has seen great improvements in the patient over the last week.  She is encouraged.  They are with the patient all the time.  They are toilet and trash can checking prior to toilet flushing or hand washer.  She is eating with people and they are encouraging her to eat what she feels that she can be successful with eating.  She has had some Skype dates with her current love interest for dinner (who mom states is supportive and knows about her condition)  Review of Systems  Neurological: Positive for light-headedness (improved since increae water intake).  Psychiatric/Behavioral: Positive for sleep disturbance (  no night terrors since her last visit - remeron is really working). The patient is nervous/anxious (reall ybad around meal times).        Objective:   Physical Exam  Constitutional: She is oriented to person, place, and time. She appears well-developed and well-nourished.  BP 106/74 mmHg  Pulse 93  Temp(Src) 98 F (36.7 C) (Oral)  Resp 16  Ht 5\' 8"  (1.727 m)  SpO2 99%  145 lb (65.772 kg)  Pulmonary/Chest: Effort normal.  Neurological: She is alert and oriented to person, place, and time.  Skin: Skin is warm and dry.  Improved color today -   Psychiatric:  Improved look on life.     Spent 75 min with the patient with >50% spent in counseling the patient.    Assessment & Plan:  Anorexia nervosa - Pt to f/u with her new nutritionist and therapist.  I think holding on her PTSD therapy for now until we  get her eating disorder stabilized is a good idea.  Plan: POCT urinalysis dipstick, Basic metabolic panel, Magnesium, Phosphorus  PTSD (post-traumatic stress disorder) - Plan: DULoxetine (CYMBALTA) 20 MG capsule  GAD (generalized anxiety disorder) - Plan: ALPRAZolam (XANAX) 1 MG tablet - increase the amount patient is able to use - she is going to focus on using this   S/P gastric bypass  Severe malnutrition - continue to monitor closely - especially since she is starting to eat  Night terrors, adult - improved on Remeron  Benny LennertSarah Weber PA-C  [facility] 05/07/2014 11:55 AM

## 2014-05-05 NOTE — Patient Instructions (Signed)
Goals for the next week   Continue the GREAT job!!!!  Try to find something constructive to do for after eating anxiety - [name] 1 daily walk, coloring or painting  Go to your appts  See you in a week

## 2014-05-07 ENCOUNTER — Telehealth: Payer: Self-pay | Admitting: Physician Assistant

## 2014-05-07 DIAGNOSIS — F514 Sleep terrors [night terrors]: Secondary | ICD-10-CM | POA: Insufficient documentation

## 2014-05-07 NOTE — Telephone Encounter (Signed)
Spoke with Luther ParodyMegan Hadley - Dietician - they had their 1st session today which is mainly an intake and information gathering - there seems be to inconsistencies with her story - they plan to to eat the best she can through to her next appts in 5 days and then they will figure out a meal plan. She plans to let Ed Lurey know some information about this patient.

## 2014-05-12 ENCOUNTER — Ambulatory Visit (INDEPENDENT_AMBULATORY_CARE_PROVIDER_SITE_OTHER): Payer: BC Managed Care – PPO | Admitting: Physician Assistant

## 2014-05-12 VITALS — BP 106/70 | HR 107 | Temp 97.6°F | Resp 16 | Ht 68.0 in

## 2014-05-12 DIAGNOSIS — Z9884 Bariatric surgery status: Secondary | ICD-10-CM

## 2014-05-12 DIAGNOSIS — F514 Sleep terrors [night terrors]: Secondary | ICD-10-CM

## 2014-05-12 DIAGNOSIS — R110 Nausea: Secondary | ICD-10-CM

## 2014-05-12 DIAGNOSIS — E43 Unspecified severe protein-calorie malnutrition: Secondary | ICD-10-CM

## 2014-05-12 DIAGNOSIS — F5000 Anorexia nervosa, unspecified: Secondary | ICD-10-CM

## 2014-05-12 DIAGNOSIS — E41 Nutritional marasmus: Secondary | ICD-10-CM

## 2014-05-12 DIAGNOSIS — F411 Generalized anxiety disorder: Secondary | ICD-10-CM

## 2014-05-12 DIAGNOSIS — F4310 Post-traumatic stress disorder, unspecified: Secondary | ICD-10-CM

## 2014-05-12 MED ORDER — ALPRAZOLAM 1 MG PO TABS: 1.0000 mg | ORAL_TABLET | Freq: Three times a day (TID) | ORAL | Status: DC | PRN

## 2014-05-12 MED ORDER — ONDANSETRON 4 MG PO TBDP: 4.0000 mg | ORAL_TABLET | Freq: Three times a day (TID) | ORAL | Status: DC | PRN

## 2014-05-12 MED ORDER — ONDANSETRON 4 MG PO TBDP
4.0000 mg | ORAL_TABLET | Freq: Once | ORAL | Status: DC
Start: 2014-05-12 — End: 2014-06-04

## 2014-05-12 MED ORDER — DULOXETINE HCL 30 MG PO CPEP: 60.0000 mg | ORAL_CAPSULE | Freq: Every day | ORAL | Status: DC

## 2014-05-12 NOTE — Progress Notes (Signed)
Subjective:    Patient ID: Sarah FrameStephanie Safi, female    DOB: 1990/08/13, 24 y.o.   MRN: [id number]  HPI Pt presents to clinic for her weekly recheck.  She has not had a great week.  Today she has been nauseated and feels like she is sick but other than that she is doing fair.  She saw the nutritionist and her understanding was if she would eat her yogurt and 2 glucernas a day that would be enough nutrition.  Per her mother she has not been doing well on her eating this week compared to last week.  Judeth CornfieldStephanie agrees and feels like by her knowing the minimal requirements for eating that is all she is going to do because she does not want to eat.  She went to therapy and that went well.  Mom has reduced her observation mainly because her mother is tired of the hassle and arguments.  She is not longer having bathroom checks at home and she is glad of that.  She states she has used no laxatives and has not purged in 10 days. She has not been exercising.  Remeron is really helping her sleep. She has not felt as good over the last week as she did the days that she was eating larger amounts of food.  She does feel like she has been doing a good job of drinking and she at least eats a yogurt with protein powder every morning with her cymbalta.  She is tolerating the cymbalta.  Her Xanax does not always seem to control her anxiety.  Before meals is the worst but the after meal regret is also bad.  Nutritionist - 1 yogurt and 2 glucerna and water a day was patients understanding of what she needed to eat.   Therapy -Lyanne CoEd Lurey - plans to see him weekly - on Wednesday  Xanax 1mg  - taking 6 a day - never feels like she needs less - am she needs more  Review of Systems     Objective:   Physical Exam  Constitutional: She is oriented to person, place, and time.  BP 106/70 mmHg  Pulse 107  Temp(Src) 97.6 F (36.4 C) (Oral)  Resp 16  Ht 5\' 8"  (1.727 m)  SpO2 100%  140 lb 4 oz (63.617 kg)  Pulmonary/Chest:  Effort normal.  Neurological: She is alert and oriented to person, place, and time.  Skin: Skin is dry.  Psychiatric: She has a normal mood and affect. Her behavior is normal. Judgment and thought content normal.   Pt was pale and appeared to not feel well.  Once she received a Zofran her nausea resolved and she started to look better.   Spent 1 hour with patient face to face       Assessment & Plan:  Nausea without vomiting - Plan: ondansetron (ZOFRAN-ODT) disintegrating tablet 4 mg, ondansetron (ZOFRAN ODT) 4 MG disintegrating tablet  Anorexia nervosa - Plan: ALPRAZolam (XANAX) 1 MG tablet  PTSD (post-traumatic stress disorder) - Plan: DULoxetine (CYMBALTA) 30 MG capsule  GAD (generalized anxiety disorder) - Increase Cymbalta to 60mg  daily.  Continue plan for Xanax of up to 6 pills a day no more than 2 in a 4 hour period.  Plan: DULoxetine (CYMBALTA) 30 MG capsule, ALPRAZolam (XANAX) 1 MG tablet  S/P gastric bypass  Night terrors, adult - controlled on Remeron -   Severe malnutrition  Pt is in a vunerable state and we are going to try less obvservation at home to  see if she feels trust of family members and me that will allow her to be more trusting of herself.  We discussed at length her need to understand her disease and not to be worried about me being focused on her absolute weight numbers and be more interested in her overall health - she knows she feel better and sleep better and is less groggy when she is eating better.  She is going to try not to do the bare minimum daily but know that she is going to have harder days when the minimum is acceptable but she is not going to strive for only the minimal effort.  She is going to reschedule with the nutritionist.  We discussed no more than 1 OV a day with the group.  I am hoping once I have her stable on medications and her outpatient team set up I will not have to see her weekly.  Benny Lennert PA-C  [facility] 05/14/2014 4:18 PM

## 2014-05-12 NOTE — Telephone Encounter (Signed)
Luther ParodyMegan Hadley (Nutritionist) called to inform Sarah Estes SagoSarah that Sarah Estes CornfieldStephanie cancelled her appointment last minute (her mom called)   Megan's number [phone]

## 2014-05-12 NOTE — Patient Instructions (Addendum)
Increase your Cymbalta to 60mg  daily  Up to 6 Xanax a day no more than 2 every 6 hours  Make a list of foods in categories that you think you can tolerate

## 2014-05-20 ENCOUNTER — Encounter: Payer: Self-pay | Admitting: Physician Assistant

## 2014-05-23 ENCOUNTER — Other Ambulatory Visit: Payer: Self-pay | Admitting: Physician Assistant

## 2014-05-28 ENCOUNTER — Encounter: Payer: Self-pay | Admitting: Physician Assistant

## 2014-05-28 NOTE — Telephone Encounter (Signed)
Tried to call pt as req'd by Sarah Estes to let her know Sarah Estes is not working today and the number we have in [employer] is not a working Research scientist (life sciences)#. Called pt's mother (emer cont) and LMOM on cell # with this info. I sent a message to pt through MyChart as well.

## 2014-06-04 ENCOUNTER — Telehealth: Payer: Self-pay

## 2014-06-04 ENCOUNTER — Ambulatory Visit (INDEPENDENT_AMBULATORY_CARE_PROVIDER_SITE_OTHER): Payer: BC Managed Care – PPO | Admitting: Physician Assistant

## 2014-06-04 VITALS — BP 123/76 | HR 142 | Resp 16 | Ht 69.0 in | Wt 132.6 lb

## 2014-06-04 DIAGNOSIS — F411 Generalized anxiety disorder: Secondary | ICD-10-CM | POA: Diagnosis not present

## 2014-06-04 DIAGNOSIS — E43 Unspecified severe protein-calorie malnutrition: Secondary | ICD-10-CM

## 2014-06-04 DIAGNOSIS — E860 Dehydration: Secondary | ICD-10-CM

## 2014-06-04 DIAGNOSIS — Z9884 Bariatric surgery status: Secondary | ICD-10-CM

## 2014-06-04 DIAGNOSIS — N10 Acute tubulo-interstitial nephritis: Secondary | ICD-10-CM | POA: Diagnosis not present

## 2014-06-04 DIAGNOSIS — E41 Nutritional marasmus: Secondary | ICD-10-CM

## 2014-06-04 DIAGNOSIS — R34 Anuria and oliguria: Secondary | ICD-10-CM

## 2014-06-04 DIAGNOSIS — F4310 Post-traumatic stress disorder, unspecified: Secondary | ICD-10-CM | POA: Diagnosis not present

## 2014-06-04 DIAGNOSIS — R109 Unspecified abdominal pain: Secondary | ICD-10-CM | POA: Diagnosis not present

## 2014-06-04 DIAGNOSIS — F5000 Anorexia nervosa, unspecified: Secondary | ICD-10-CM

## 2014-06-04 LAB — POCT UA - MICROSCOPIC ONLY
CASTS, UR, LPF, POC: NEGATIVE
CRYSTALS, UR, HPF, POC: NEGATIVE
Mucus, UA: NEGATIVE
Yeast, UA: NEGATIVE

## 2014-06-04 LAB — POCT CBC
Granulocyte percent: 82.8 %G — AB (ref 37–80)
HEMATOCRIT: 45.3 % (ref 37.7–47.9)
Hemoglobin: 15.1 g/dL (ref 12.2–16.2)
LYMPH, POC: 1.8 (ref 0.6–3.4)
MCH: 31.7 pg — AB (ref 27–31.2)
MCHC: 33.4 g/dL (ref 31.8–35.4)
MCV: 95 fL (ref 80–97)
MID (CBC): 0.7 (ref 0–0.9)
MPV: 7.2 fL (ref 0–99.8)
POC Granulocyte: 12 — AB (ref 2–6.9)
POC LYMPH PERCENT: 12.5 %L (ref 10–50)
POC MID %: 4.7 %M (ref 0–12)
Platelet Count, POC: 329 10*3/uL (ref 142–424)
RBC: 4.77 M/uL (ref 4.04–5.48)
RDW, POC: 13.2 %
WBC: 14.5 10*3/uL — AB (ref 4.6–10.2)

## 2014-06-04 LAB — POCT URINALYSIS DIPSTICK
BILIRUBIN UA: NEGATIVE
GLUCOSE UA: NEGATIVE
KETONES UA: NEGATIVE
NITRITE UA: NEGATIVE
Protein, UA: >=300
Spec Grav, UA: 1.025
Urobilinogen, UA: 0.2
pH, UA: 6

## 2014-06-04 MED ORDER — CIPROFLOXACIN HCL 500 MG PO TABS: 500.0000 mg | ORAL_TABLET | Freq: Two times a day (BID) | ORAL | Status: DC

## 2014-06-04 MED ORDER — ALPRAZOLAM 1 MG PO TABS: 1.0000 mg | ORAL_TABLET | Freq: Three times a day (TID) | ORAL | Status: DC | PRN

## 2014-06-04 MED ORDER — CEFTRIAXONE SODIUM 1 G IJ SOLR
1.0000 g | Freq: Once | INTRAMUSCULAR | Status: AC
  Administered 2014-06-04: 1 g via INTRAMUSCULAR

## 2014-06-04 MED ORDER — DULOXETINE HCL 60 MG PO CPEP: 120.0000 mg | ORAL_CAPSULE | Freq: Every day | ORAL | Status: DC

## 2014-06-04 MED ORDER — HYDROCODONE-ACETAMINOPHEN 5-325 MG PO TABS: 1.0000 | ORAL_TABLET | Freq: Four times a day (QID) | ORAL | Status: DC | PRN
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
## 2014-06-04 NOTE — Telephone Encounter (Signed)
error 

## 2014-06-05 ENCOUNTER — Ambulatory Visit (INDEPENDENT_AMBULATORY_CARE_PROVIDER_SITE_OTHER): Payer: BC Managed Care – PPO | Admitting: Physician Assistant

## 2014-06-05 VITALS — BP 106/82 | HR 111 | Temp 97.9°F | Resp 16 | Ht 68.25 in | Wt 135.4 lb

## 2014-06-05 DIAGNOSIS — R3989 Other symptoms and signs involving the genitourinary system: Secondary | ICD-10-CM

## 2014-06-05 DIAGNOSIS — E41 Nutritional marasmus: Secondary | ICD-10-CM | POA: Diagnosis not present

## 2014-06-05 DIAGNOSIS — E43 Unspecified severe protein-calorie malnutrition: Secondary | ICD-10-CM

## 2014-06-05 DIAGNOSIS — N3289 Other specified disorders of bladder: Secondary | ICD-10-CM

## 2014-06-05 LAB — POCT UA - MICROSCOPIC ONLY
BACTERIA, U MICROSCOPIC: NEGATIVE
CASTS, UR, LPF, POC: NEGATIVE
Crystals, Ur, HPF, POC: NEGATIVE
Mucus, UA: NEGATIVE
YEAST UA: NEGATIVE

## 2014-06-05 LAB — COMPLETE METABOLIC PANEL WITH GFR
ALT: 15 U/L (ref 0–35)
AST: 14 U/L (ref 0–37)
Albumin: 4.2 g/dL (ref 3.5–5.2)
Alkaline Phosphatase: 83 U/L (ref 39–117)
BUN: 15 mg/dL (ref 6–23)
CALCIUM: 9.5 mg/dL (ref 8.4–10.5)
CO2: 26 meq/L (ref 19–32)
Chloride: 100 mEq/L (ref 96–112)
Creat: 0.66 mg/dL (ref 0.50–1.10)
GFR, Est Non African American: >89 mL/min
Glucose, Bld: 90 mg/dL (ref 70–99)
Potassium: 3.3 mEq/L — ABNORMAL LOW (ref 3.5–5.3)
Sodium: 138 mEq/L (ref 135–145)
TOTAL PROTEIN: 6.9 g/dL (ref 6.0–8.3)
Total Bilirubin: 1.1 mg/dL (ref 0.2–1.2)

## 2014-06-05 LAB — POCT CBC
Granulocyte percent: 80.8 %G — AB (ref 37–80)
HCT, POC: 44.7 % (ref 37.7–47.9)
Hemoglobin: 14.9 g/dL (ref 12.2–16.2)
LYMPH, POC: 1.5 (ref 0.6–3.4)
MCH, POC: 31.9 pg — AB (ref 27–31.2)
MCHC: 33.2 g/dL (ref 31.8–35.4)
MCV: 96 fL (ref 80–97)
MID (cbc): 0.3 (ref 0–0.9)
MPV: 7.4 fL (ref 0–99.8)
PLATELET COUNT, POC: 304 10*3/uL (ref 142–424)
POC GRANULOCYTE: 7.7 — AB (ref 2–6.9)
POC LYMPH PERCENT: 16.3 %L (ref 10–50)
POC MID %: 2.9 % (ref 0–12)
RBC: 4.65 M/uL (ref 4.04–5.48)
RDW, POC: 12.9 %
WBC: 9.5 10*3/uL (ref 4.6–10.2)

## 2014-06-05 LAB — POCT URINALYSIS DIPSTICK
BILIRUBIN UA: NEGATIVE
Glucose, UA: NEGATIVE
KETONES UA: NEGATIVE
Nitrite, UA: NEGATIVE
PH UA: 6
PROTEIN UA: 30
Spec Grav, UA: >=1.03
Urobilinogen, UA: 0.2

## 2014-06-05 MED ORDER — FLUCONAZOLE 150 MG PO TABS: 150.0000 mg | ORAL_TABLET | Freq: Once | ORAL | Status: DC

## 2014-06-05 NOTE — Progress Notes (Signed)
Patient ID: Sarah Estes, female    DOB: [birth year], 24 y.o.   MRN: [id number]  PCP: Imelda Pillow, NP  Subjective:   Chief Complaint  Patient presents with  . Follow-up    for blood count    HPI  Sarah Estes presents for re-evaluation after a diagnosis yesterday of pyelonephritis.  Sarah Estes note is appreciated.  Sarah Estes feels some better, certainly not worse, and says the hydrocodone helped. She reports she's had more to drink this morning than she usually drinks all day.  No fever, chills, nausea this morning. She is concerned that she will develop a vaginal yeast infection while taking the antibiotic and requests Diflucan.  She does not currently have any vaginal symptoms.  Sarah Estes is present with her today, along with her dog, Sarah Estes.  Review of Systems Review of Systems  As above.   Patient Active Problem List   Diagnosis Date Noted  . Night terrors, adult 05/07/2014  . Severe malnutrition 05/02/2014  . Dehydration 04/28/2014  . PTSD (post-traumatic stress disorder) 04/07/2014  . Anorexia nervosa 03/26/2014  . GAD (generalized anxiety disorder) 03/26/2014  . ADD (attention deficit disorder) 03/26/2014  . S/P gastric bypass 03/26/2014  . Airway hyperreactivity 08/09/2012  . Endometriosis 08/09/2012  . Pseudotumor cerebri 07/08/2012     Prior to Admission medications   Medication Sig Start Date End Date Taking? Authorizing Provider  ALPRAZolam Duanne Moron) 1 MG tablet Take 1-2 tablets (1-2 mg total) by mouth 3 (three) times daily as needed for anxiety. 06/04/14  Yes Mancel Bale, PA-C  ciprofloxacin (CIPRO) 500 MG tablet Take 1 tablet (500 mg total) by mouth 2 (two) times daily. 06/04/14  Yes Mancel Bale, PA-C  Coenzyme Q10 200 MG capsule Take 400 mg by mouth daily.   Yes Historical Provider, MD  cyanocobalamin (,VITAMIN B-12,) 1000 MCG/ML injection Inject 1,000 mcg into the muscle every 30 (thirty) days.   Yes Historical Provider, MD  docusate sodium  (COLACE) 100 MG capsule Take 3 capsules (300 mg total) by mouth daily. Patient taking differently: Take 100-300 mg by mouth daily.  04/14/14  Yes Mancel Bale, PA-C  DULoxetine (CYMBALTA) 60 MG capsule Take 2 capsules (120 mg total) by mouth daily. 06/04/14  Yes Mancel Bale, PA-C  Glucose Blood (BLOOD GLUCOSE TEST STRIPS) STRP Use as directed. 04/29/14  Yes Mancel Bale, PA-C  glucose monitoring kit (FREESTYLE) monitoring kit 1 each by Does not apply route as needed for other. 04/28/14  Yes Mancel Bale, PA-C  HYDROcodone-acetaminophen (NORCO/VICODIN) 5-325 MG per tablet Take 1 tablet by mouth every 6 (six) hours as needed for moderate pain. 06/04/14  Yes Mancel Bale, PA-C  levonorgestrel (MIRENA) 20 MCG/24HR IUD 1 each by Intrauterine route continuous.   Yes Historical Provider, MD  mirtazapine (REMERON) 15 MG tablet TAKE 1 TABLET BY MOUTH AT BEDTIME 05/25/14  Yes Mancel Bale, PA-C  ondansetron (ZOFRAN ODT) 4 MG disintegrating tablet Take 1 tablet (4 mg total) by mouth every 8 (eight) hours as needed for nausea. 05/12/14  Yes Mancel Bale, PA-C  polyethylene glycol powder (MIRALAX) powder Take 17 g by mouth once. Patient taking differently: Take 8.5 g by mouth daily.  04/14/14  Yes Mancel Bale, PA-C  Vitamin D, Ergocalciferol, (DRISDOL) 50000 UNITS CAPS capsule Take 50,000 Units by mouth every Saturday.    Yes Historical Provider, MD  fluconazole (DIFLUCAN) 150 MG tablet Take 1 tablet (150 mg total) by mouth once. Repeat if needed 06/05/14  Chelle S Jeffery, PA-C     Allergies  Allergen Reactions  . Corticosteroids Other (See Comments)    Injections cannot take due to gastric surgery (creams or tablets?)  . Gluten Meal Nausea Only and Other (See Comments)    Exacerbates endometriosis symptoms    . Nsaids Other (See Comments)    Had gastric bypass surgery   . Sulfa Antibiotics Hives, Itching and Rash  . Morphine And Related Itching       Objective:  Physical Exam  Physical Exam    Constitutional: She is oriented to person, place, and time. She appears well-developed and well-nourished. No distress.  BP 106/82 mmHg  Pulse 111  Temp(Src) 97.9 F (36.6 C) (Oral)  Resp 16  Ht 5' 8.25" (1.734 m)  Wt 135 lb 6.4 oz (61.417 kg)  BMI 20.43 kg/m2  SpO2 96%   HENT:  Head: Normocephalic and atraumatic.  Eyes: Conjunctivae and EOM are normal. Pupils are equal, round, and reactive to light. No scleral icterus.  Neck: Neck supple. No thyromegaly present.  Cardiovascular: Regular rhythm, normal heart sounds and intact distal pulses.  Tachycardia present.   Pulses:      Radial pulses are 2+ on the right side, and 2+ on the left side.  Pulmonary/Chest: Effort normal and breath sounds normal.  Lymphadenopathy:    She has no cervical adenopathy.  Neurological: She is alert and oriented to person, place, and time.  Skin: Skin is warm and dry. She is not diaphoretic.  Psychiatric: She has a normal mood and affect. Her speech is normal and behavior is normal.    Results for orders placed or performed in visit on 06/05/14  POCT CBC  Result Value Ref Range   WBC 9.5 4.6 - 10.2 K/uL   Lymph, poc 1.5 0.6 - 3.4   POC LYMPH PERCENT 16.3 10 - 50 %L   MID (cbc) 0.3 0 - 0.9   POC MID % 2.9 0 - 12 %M   POC Granulocyte 7.7 (A) 2 - 6.9   Granulocyte percent 80.8 (A) 37 - 80 %G   RBC 4.65 4.04 - 5.48 M/uL   Hemoglobin 14.9 12.2 - 16.2 g/dL   HCT, POC 44.7 37.7 - 47.9 %   MCV 96.0 80 - 97 fL   MCH, POC 31.9 (A) 27 - 31.2 pg   MCHC 33.2 31.8 - 35.4 g/dL   RDW, POC 12.9 %   Platelet Count, POC 304 142 - 424 K/uL   MPV 7.4 0 - 99.8 fL  POCT urinalysis dipstick  Result Value Ref Range   Color, UA yellow    Clarity, UA clear    Glucose, UA neg    Bilirubin, UA neg    Ketones, UA neg    Spec Grav, UA >=1.030    Blood, UA trace-lysed    pH, UA 6.0    Protein, UA 30    Urobilinogen, UA 0.2    Nitrite, UA neg    Leukocytes, UA Trace   POCT UA - Microscopic Only  Result Value  Ref Range   WBC, Ur, HPF, POC 15-30    RBC, urine, microscopic 2-5    Bacteria, U Microscopic neg    Mucus, UA neg    Epithelial cells, urine per micros 2-5    Crystals, Ur, HPF, POC neg    Casts, Ur, LPF, POC neg    Yeast, UA neg    Renal tubular cells              Assessment & Plan:   1. Sensation of pressure in bladder area CBC is improved overall today, though mild left shift persists. UA reveals resolution of bactiuria and 50% reduction in leucocytes. Continue treatment for pyelonephritis. Diflucan prescription to use if needed. Continue with hydration.  - POCT CBC - POCT urinalysis dipstick - POCT UA - Microscopic Only - fluconazole (DIFLUCAN) 150 MG tablet; Take 1 tablet (150 mg total) by mouth once. Repeat if needed  Dispense: 2 tablet; Refill: 0  2. Severe malnutrition Encouraged adequate hydration and healthy eating habits, increasing calories, etc. - POCT CBC  Return in about 3 weeks (around 06/26/2014) for re-evaluation with Sarah Weber, PA-C. Sooner if symptoms worsen/persist.  Chelle S. Jeffery, PA-C Physician Assistant-Certified [facility]  

## 2014-06-07 LAB — URINE CULTURE

## 2014-06-10 ENCOUNTER — Emergency Department (HOSPITAL_COMMUNITY)
Admission: EM | Admit: 2014-06-10 | Discharge: 2014-06-10 | Disposition: A | Discharge status: Home / Self Care | Payer: BC Managed Care – PPO | Attending: Emergency Medicine | Admitting: Emergency Medicine

## 2014-06-10 ENCOUNTER — Encounter (HOSPITAL_COMMUNITY): Payer: Self-pay | Admitting: *Deleted

## 2014-06-10 ENCOUNTER — Emergency Department (HOSPITAL_COMMUNITY): Payer: BC Managed Care – PPO

## 2014-06-10 DIAGNOSIS — Z79899 Other long term (current) drug therapy: Secondary | ICD-10-CM | POA: Insufficient documentation

## 2014-06-10 DIAGNOSIS — F329 Major depressive disorder, single episode, unspecified: Secondary | ICD-10-CM | POA: Diagnosis not present

## 2014-06-10 DIAGNOSIS — R079 Chest pain, unspecified: Secondary | ICD-10-CM

## 2014-06-10 DIAGNOSIS — F419 Anxiety disorder, unspecified: Secondary | ICD-10-CM | POA: Diagnosis not present

## 2014-06-10 DIAGNOSIS — Z87448 Personal history of other diseases of urinary system: Secondary | ICD-10-CM | POA: Diagnosis not present

## 2014-06-10 DIAGNOSIS — Z792 Long term (current) use of antibiotics: Secondary | ICD-10-CM | POA: Insufficient documentation

## 2014-06-10 DIAGNOSIS — R1011 Right upper quadrant pain: Secondary | ICD-10-CM | POA: Diagnosis not present

## 2014-06-10 DIAGNOSIS — J45901 Unspecified asthma with (acute) exacerbation: Secondary | ICD-10-CM | POA: Insufficient documentation

## 2014-06-10 DIAGNOSIS — Z8669 Personal history of other diseases of the nervous system and sense organs: Secondary | ICD-10-CM | POA: Insufficient documentation

## 2014-06-10 DIAGNOSIS — Z3202 Encounter for pregnancy test, result negative: Secondary | ICD-10-CM | POA: Insufficient documentation

## 2014-06-10 LAB — URINE MICROSCOPIC-ADD ON

## 2014-06-10 LAB — URINALYSIS, ROUTINE W REFLEX MICROSCOPIC
BILIRUBIN URINE: NEGATIVE
GLUCOSE, UA: NEGATIVE mg/dL
Hgb urine dipstick: NEGATIVE
KETONES UR: NEGATIVE mg/dL
Nitrite: NEGATIVE
Protein, ur: NEGATIVE mg/dL
Specific Gravity, Urine: 1.016 (ref 1.005–1.030)
Urobilinogen, UA: 0.2 mg/dL (ref 0.0–1.0)
pH: 6 (ref 5.0–8.0)

## 2014-06-10 LAB — CBC WITH DIFFERENTIAL/PLATELET
BASOS ABS: 0 10*3/uL (ref 0.0–0.1)
Basophils Relative: 0 % (ref 0–1)
EOS ABS: 0 10*3/uL (ref 0.0–0.7)
EOS PCT: 1 % (ref 0–5)
HCT: 35.2 % — ABNORMAL LOW (ref 36.0–46.0)
Hemoglobin: 12.3 g/dL (ref 12.0–15.0)
Lymphocytes Relative: 43 % (ref 12–46)
Lymphs Abs: 2 10*3/uL (ref 0.7–4.0)
MCH: 31.3 pg (ref 26.0–34.0)
MCHC: 34.9 g/dL (ref 30.0–36.0)
MCV: 89.6 fL (ref 78.0–100.0)
Monocytes Absolute: 0.2 10*3/uL (ref 0.1–1.0)
Monocytes Relative: 5 % (ref 3–12)
Neutro Abs: 2.3 10*3/uL (ref 1.7–7.7)
Neutrophils Relative %: 51 % (ref 43–77)
PLATELETS: 304 10*3/uL (ref 150–400)
RBC: 3.93 MIL/uL (ref 3.87–5.11)
RDW: 12.3 % (ref 11.5–15.5)
WBC: 4.6 10*3/uL (ref 4.0–10.5)

## 2014-06-10 LAB — COMPREHENSIVE METABOLIC PANEL
ALT: 23 U/L (ref 0–35)
AST: 21 U/L (ref 0–37)
Albumin: 3.1 g/dL — ABNORMAL LOW (ref 3.5–5.2)
Alkaline Phosphatase: 63 U/L (ref 39–117)
Anion gap: 5 (ref 5–15)
BUN: 15 mg/dL (ref 6–23)
CALCIUM: 8.8 mg/dL (ref 8.4–10.5)
CO2: 28 mmol/L (ref 19–32)
CREATININE: 0.59 mg/dL (ref 0.50–1.10)
Chloride: 107 mmol/L (ref 96–112)
GFR calc non Af Amer: >90 mL/min (ref >90)
GLUCOSE: 84 mg/dL (ref 70–99)
Potassium: 3.8 mmol/L (ref 3.5–5.1)
Sodium: 140 mmol/L (ref 135–145)
TOTAL PROTEIN: 5.8 g/dL — AB (ref 6.0–8.3)
Total Bilirubin: 0.5 mg/dL (ref 0.3–1.2)

## 2014-06-10 LAB — D-DIMER, QUANTITATIVE: D-Dimer, Quant: <0.27 ug/mL-FEU (ref 0.00–0.48)

## 2014-06-10 LAB — LIPASE, BLOOD: LIPASE: 35 U/L (ref 11–59)

## 2014-06-10 LAB — PREGNANCY, URINE: Preg Test, Ur: NEGATIVE

## 2014-06-10 MED ORDER — ONDANSETRON 4 MG PO TBDP: 4.0000 mg | ORAL_TABLET | Freq: Three times a day (TID) | ORAL | Status: DC | PRN

## 2014-06-10 MED ORDER — HYDROMORPHONE HCL 1 MG/ML IJ SOLN
1.0000 mg | Freq: Once | INTRAMUSCULAR | Status: AC
  Administered 2014-06-10: 1 mg via INTRAVENOUS
  Filled 2014-06-10: qty 1

## 2014-06-10 MED ORDER — SODIUM CHLORIDE 0.9 % IV BOLUS (SEPSIS)
1000.0000 mL | Freq: Once | INTRAVENOUS | Status: AC
  Administered 2014-06-10: 1000 mL via INTRAVENOUS

## 2014-06-10 MED ORDER — HYDROCODONE-ACETAMINOPHEN 5-325 MG PO TABS: 2.0000 | ORAL_TABLET | ORAL | Status: DC | PRN

## 2014-06-10 MED ORDER — ONDANSETRON HCL 4 MG/2ML IJ SOLN
4.0000 mg | Freq: Once | INTRAMUSCULAR | Status: AC
  Administered 2014-06-10: 4 mg via INTRAVENOUS
  Filled 2014-06-10: qty 2

## 2014-06-10 NOTE — Discharge Instructions (Signed)
Take Vicodin as needed for pain. Take zofran as needed for nausea. Refer to attached documents for more information. Follow up with your doctor for further evaluation.
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
## 2014-06-10 NOTE — ED Provider Notes (Signed)
CSN: [id number]     Arrival date & time 06/10/14  0807 History   First MD Initiated Contact with Patient 06/10/14 [phone]     Chief Complaint  Patient presents with  . Chest Pain  . Abdominal Pain     (Consider location/radiation/quality/duration/timing/severity/associated sxs/prior Treatment) HPI Comments: Patient is a 24 year old female with a past medical history of anorexia nervosa, PTSD, GAD, ADD, and endometriosis who present with abdominal pain for the past 3 days. The pain is located in her RUQ and radiates to her right anterior chest. The pain is described as sharp and severe. She also has pain at her right scapula. The pain started gradually and progressively worsened since the onset. No alleviating/aggravating factors. The patient has tried nothing for symptoms without relief. Associated symptoms include nausea. Patient denies fever, headache, vomiting, diarrhea, chest pain, SOB, dysuria, constipation, abnormal vaginal bleeding/discharge.      Past Medical History  Diagnosis Date  . Endometriosis   . Adult ADHD   . Anxiety and depression   . Pseudotumor cerebri 2011  . H/O gastric bypass   . Allergy   . Anxiety   . Asthma   . Depression   . Anorexia    Past Surgical History  Procedure Laterality Date  . Colonoscopy    . Lumbar puncture    . Laparoscopic endometriosis fulguration    . Cosmetic surgery    . Gastric bypass     Family History  Problem Relation Age of Onset  . Heart disease Maternal Grandfather   . Hypertension Maternal Grandfather   . Stroke Maternal Grandfather   . Diabetes Father   . Hypertension Father   . Hypertension Paternal Grandmother   . Mental retardation Paternal Grandmother   . Diabetes Paternal Grandfather   . Hyperlipidemia Paternal Grandfather   . Hypertension Paternal Grandfather    History  Substance Use Topics  . Smoking status: Never Smoker   . Smokeless tobacco: Never Used  . Alcohol Use: 0.0 oz/week    0 Standard drinks  or equivalent per week     Comment: Socially   OB History    No data available     Review of Systems  Constitutional: Negative for fever, chills and fatigue.  HENT: Negative for trouble swallowing.   Eyes: Negative for visual disturbance.  Respiratory: Positive for shortness of breath.   Cardiovascular: Positive for chest pain. Negative for palpitations.  Gastrointestinal: Positive for abdominal pain. Negative for nausea, vomiting and diarrhea.  Genitourinary: Negative for dysuria and difficulty urinating.  Musculoskeletal: Negative for arthralgias and neck pain.  Skin: Negative for color change.  Neurological: Negative for dizziness and weakness.  Psychiatric/Behavioral: Negative for dysphoric mood.      Allergies  Corticosteroids; Gluten meal; Nsaids; Sulfa antibiotics; and Morphine and related  Home Medications   Prior to Admission medications   Medication Sig Start Date End Date Taking? Authorizing Provider  acetaminophen (TYLENOL) 650 MG CR tablet Take 650 mg by mouth every 8 (eight) hours as needed for pain.   Yes Historical Provider, MD  ALPRAZolam Duanne Moron) 1 MG tablet Take 1-2 tablets (1-2 mg total) by mouth 3 (three) times daily as needed for anxiety. 06/04/14  Yes Mancel Bale, PA-C  ciprofloxacin (CIPRO) 500 MG tablet Take 1 tablet (500 mg total) by mouth 2 (two) times daily. 06/04/14  Yes Mancel Bale, PA-C  Coenzyme Q10 200 MG capsule Take 400 mg by mouth daily.   Yes Historical Provider, MD  cyanocobalamin (,  VITAMIN B-12,) 1000 MCG/ML injection Inject 1,000 mcg into the muscle every 30 (thirty) days.   Yes Historical Provider, MD  DULoxetine (CYMBALTA) 60 MG capsule Take 2 capsules (120 mg total) by mouth daily. 06/04/14  Yes Mancel Bale, PA-C  HYDROcodone-acetaminophen (NORCO/VICODIN) 5-325 MG per tablet Take 1 tablet by mouth every 6 (six) hours as needed for moderate pain. 06/04/14  Yes Mancel Bale, PA-C  levonorgestrel (MIRENA) 20 MCG/24HR IUD 1 each by  Intrauterine route continuous.   Yes Historical Provider, MD  mirtazapine (REMERON) 15 MG tablet TAKE 1 TABLET BY MOUTH AT BEDTIME 05/25/14  Yes Mancel Bale, PA-C  ondansetron (ZOFRAN ODT) 4 MG disintegrating tablet Take 1 tablet (4 mg total) by mouth every 8 (eight) hours as needed for nausea. 05/12/14  Yes Mancel Bale, PA-C  Vitamin D, Ergocalciferol, (DRISDOL) 50000 UNITS CAPS capsule Take 50,000 Units by mouth every Saturday.    Yes Historical Provider, MD  docusate sodium (COLACE) 100 MG capsule Take 3 capsules (300 mg total) by mouth daily. Patient not taking: Reported on 06/10/2014 04/14/14   Mancel Bale, PA-C  fluconazole (DIFLUCAN) 150 MG tablet Take 1 tablet (150 mg total) by mouth once. Repeat if needed Patient not taking: Reported on 06/10/2014 06/05/14   Chelle S Jeffery, PA-C  Glucose Blood (BLOOD GLUCOSE TEST STRIPS) STRP Use as directed. 04/29/14   Mancel Bale, PA-C  glucose monitoring kit (FREESTYLE) monitoring kit 1 each by Does not apply route as needed for other. 04/28/14   Mancel Bale, PA-C  polyethylene glycol powder (MIRALAX) powder Take 17 g by mouth once. Patient not taking: Reported on 06/10/2014 04/14/14   Mancel Bale, PA-C   BP 107/70 mmHg  Pulse 112  Temp(Src) 97.6 F (36.4 C) (Oral)  Resp 16  Ht _0  (1.753 m)  Wt 143 lb 8 oz (65.091 kg)  BMI 21.18 kg/m2  SpO2 96% Physical Exam  Constitutional: She is oriented to person, place, and time. She appears well-developed and well-nourished. No distress.  HENT:  Head: Normocephalic and atraumatic.  Eyes: Conjunctivae and EOM are normal.  Neck: Normal range of motion.  Cardiovascular: Regular rhythm.  Exam reveals no gallop and no friction rub.   No murmur heard. tachycardic  Pulmonary/Chest: Effort normal and breath sounds normal. She has no wheezes. She has no rales. She exhibits no tenderness.  Abdominal: Soft. She exhibits no distension. There is no tenderness. There is no rebound.  RUQ tenderness to palpation.  No other focal tenderness to palpation. No peritoneal signs.   Musculoskeletal: Normal range of motion.  Neurological: She is alert and oriented to person, place, and time. Coordination normal.  Speech is goal-oriented. Moves limbs without ataxia.   Skin: Skin is warm and dry.  Psychiatric: She has a normal mood and affect. Her behavior is normal.  Nursing note and vitals reviewed.   ED Course  Procedures (including critical care time) Labs Review Labs Reviewed  CBC WITH DIFFERENTIAL/PLATELET - Abnormal; Notable for the following:    HCT 35.2 (*)    All other components within normal limits  COMPREHENSIVE METABOLIC PANEL - Abnormal; Notable for the following:    Total Protein 5.8 (*)    Albumin 3.1 (*)    All other components within normal limits  URINALYSIS, ROUTINE W REFLEX MICROSCOPIC - Abnormal; Notable for the following:    APPearance CLOUDY (*)    Leukocytes, UA TRACE (*)    All other components within normal limits  URINE MICROSCOPIC-ADD ON - Abnormal; Notable for the following:    Squamous Epithelial / LPF FEW (*)    Bacteria, UA FEW (*)    All other components within normal limits  LIPASE, BLOOD  PREGNANCY, URINE  D-DIMER, QUANTITATIVE    Imaging Review Dg Chest 2 View  06/10/2014   CLINICAL DATA:  Left Chest stabbing pain and shortness of breath for 3 days. Initial encounter.  EXAM: CHEST  2 VIEW  COMPARISON:  Acute abdominal series 04/04/2013.  FINDINGS: The heart size and mediastinal contours are normal. The lungs are clear. There is no pleural effusion or pneumothorax. No acute osseous findings are identified.  IMPRESSION: Stable examination.  No active cardiopulmonary process.   Electronically Signed   By: Richardean Sale M.D.   On: 06/10/2014 09:35   US Abdomen Complete  06/10/2014   CLINICAL DATA:  Right upper quadrant pain with nausea and vomiting over the past year worsening over the past 3 days. Previous gastric bypass surgery.  EXAM: ULTRASOUND ABDOMEN COMPLETE   COMPARISON:  07/10/2013  FINDINGS: Gallbladder: No definite gallstones or sludge. No gallbladder wall thickening. The previously seen single non shadowing dependent echogenic focus is not visualized on the current exam. Negative sonographic Murphy's sign.  Common bile duct: Diameter: 2.2 mm.  Liver: No focal lesion identified. Within normal limits in parenchymal echogenicity.  IVC: No abnormality visualized.  Pancreas: Visualized portion unremarkable.  Spleen: Size and appearance within normal limits.  Right Kidney: Length: 11.6 cm. Echogenicity within normal limits. No mass or hydronephrosis visualized.  Left Kidney: Length: 13.6 cm. Echogenicity within normal limits. No mass or hydronephrosis visualized.  Abdominal aorta: No aneurysm visualized.  Other findings: None.  IMPRESSION: No acute hepatobiliary disease.   Electronically Signed   By: Marin Olp M.D.   On: 06/10/2014 14:54     EKG Interpretation None      MDM   Final diagnoses:  Chest pain  RUQ abdominal pain   9:54 AM Labs and urinalysis pending. Chest xray unremarkable for acute changes. Patient is tachycardic with remaining vitals stable.   3:05 PM [place] and labs unremarkable for acute changes. Patient will have PCP follow up. Vitals stable and patient afebrile. Vitals stable and patient afebrile. Patient instructed to return with worsening or concerning symptoms.    Alvina Chou, PA-C 06/10/14 [place], MD 06/13/14 [phone]

## 2014-06-10 NOTE — ED Notes (Signed)
Pt reports abd pain and mid chest pain since Monday, had recent weight gain of 20 lbs. ekg done at triage.

## 2014-06-15 ENCOUNTER — Telehealth: Payer: Self-pay

## 2014-06-15 NOTE — Telephone Encounter (Signed)
Pt saw Fernande Brashelle S Jeffery, PA-C at 06/05/2014 11:22 AM for a kidney infection; pt states that she has finished the fluconazole (DIFLUCAN) 150 MG tablet [[id number]][[id number]] , and is still having the same issues. Would like to speak with a nurse in regards to this. Please advise

## 2014-06-19 NOTE — Telephone Encounter (Signed)
lmom to cb. 

## 2014-06-22 NOTE — Telephone Encounter (Signed)
lmom for pt to cb

## 2014-06-23 ENCOUNTER — Telehealth: Payer: Self-pay

## 2014-06-24 ENCOUNTER — Telehealth: Payer: Self-pay

## 2014-06-24 NOTE — Telephone Encounter (Signed)
Patient needs a letter for the bank to defer her loan payments, since she has been out of work for so long. She needs this no later than tomorrow and Benny LennertSarah Weber knows about it.

## 2014-06-25 ENCOUNTER — Encounter: Payer: Self-pay | Admitting: Physician Assistant

## 2014-06-25 NOTE — Telephone Encounter (Signed)
Pt is not being compliant with any of my treatment.  I have been out of the office which is why the letter has not been written.  I have written a letter that she has been out of work due to medical condition.

## 2014-06-25 NOTE — Telephone Encounter (Signed)
Patients mother is calling again regarding a letter that needs to be written by Benny LennertSarah Weber. She is very anxious and upset because she has been trying to get this letter for 2 days. Please advise

## 2014-06-25 NOTE — Telephone Encounter (Signed)
Sarah, please advise. Thanks.  

## 2014-06-25 NOTE — Telephone Encounter (Signed)
Pt's mom called checking on status of this message. Please CB

## 2014-06-30 ENCOUNTER — Encounter: Payer: Self-pay | Admitting: Physician Assistant

## 2014-07-02 ENCOUNTER — Encounter: Payer: Self-pay | Admitting: Physician Assistant

## 2014-07-10 ENCOUNTER — Other Ambulatory Visit: Payer: Self-pay | Admitting: Physician Assistant

## 2014-07-13 NOTE — Telephone Encounter (Signed)
Faxed alprazolam. 

## 2014-07-23 ENCOUNTER — Telehealth: Payer: Self-pay

## 2014-07-23 ENCOUNTER — Other Ambulatory Visit: Payer: Self-pay | Admitting: Physician Assistant

## 2014-07-23 NOTE — Telephone Encounter (Signed)
[place] and ran out of remeron and would like to try a different kind of medication called minipressand. Patient also states that she needs additional antidepressant because Cymbalta isn't enough. Her best friend recently died at age 8922 and she states she is a mess and desperately needs help boosting depression.Please call! [phone]

## 2014-07-24 ENCOUNTER — Other Ambulatory Visit: Payer: Self-pay | Admitting: Physician Assistant

## 2014-07-24 NOTE — Telephone Encounter (Signed)
I am not willing to change her medication without an OV.  I have not seen her in a really long time.  I think that the Remeron is not helping with her increase in stress along with everything else that is going on.  Please let her know how sorry I am about her friends death.  Also let her know that I would like to see her when she returns to town.  Also let her know that it does not surprise me that the Cymbalta is not working anymore and we need to have a discussion about that when I see her next.

## 2014-07-24 NOTE — Telephone Encounter (Signed)
Spoke with pt, she states if she can just get a two week supply bc she is eventually moving up there and the earliest appt is May 19. Can she have a refill until then? She is going to see a doctor there but in the state they will not let family doctors prescribe psych meds.

## 2014-07-27 MED ORDER — DULOXETINE HCL 60 MG PO CPEP: ORAL_CAPSULE | ORAL | Status: DC

## 2014-07-27 NOTE — Telephone Encounter (Signed)
She should have enough Remeron.  I sent her in a 2 wek supply of the Cymbalta.  Please let her know that I wish her luck with the move and hope that it is better for her where she is.

## 2014-07-29 NOTE — Telephone Encounter (Signed)
LMOM for pt w/Sarah's message and advised that if she does remain/return here that Maralyn SagoSarah will be happy to see her again.

## 2014-08-03 DIAGNOSIS — Z0271 Encounter for disability determination: Secondary | ICD-10-CM

## 2015-02-21 ENCOUNTER — Ambulatory Visit (INDEPENDENT_AMBULATORY_CARE_PROVIDER_SITE_OTHER): Payer: BC Managed Care – PPO | Admitting: Physician Assistant

## 2015-02-21 VITALS — BP 127/86 | HR 80 | Temp 98.6°F | Resp 16 | Ht 68.75 in | Wt 159.2 lb

## 2015-02-21 DIAGNOSIS — Z23 Encounter for immunization: Secondary | ICD-10-CM

## 2015-02-21 DIAGNOSIS — F509 Eating disorder, unspecified: Secondary | ICD-10-CM | POA: Insufficient documentation

## 2015-02-21 DIAGNOSIS — F4310 Post-traumatic stress disorder, unspecified: Secondary | ICD-10-CM | POA: Diagnosis not present

## 2015-02-21 DIAGNOSIS — R7989 Other specified abnormal findings of blood chemistry: Secondary | ICD-10-CM | POA: Diagnosis not present

## 2015-02-21 DIAGNOSIS — F411 Generalized anxiety disorder: Secondary | ICD-10-CM | POA: Diagnosis not present

## 2015-02-21 DIAGNOSIS — Z9884 Bariatric surgery status: Secondary | ICD-10-CM

## 2015-02-21 DIAGNOSIS — F514 Sleep terrors [night terrors]: Secondary | ICD-10-CM | POA: Diagnosis not present

## 2015-02-21 DIAGNOSIS — Z91410 Personal history of adult physical and sexual abuse: Secondary | ICD-10-CM

## 2015-02-21 LAB — CBC WITH DIFFERENTIAL/PLATELET
Basophils Absolute: 0 10*3/uL (ref 0.0–0.1)
Basophils Relative: 0 % (ref 0–1)
Eosinophils Absolute: 0.1 10*3/uL (ref 0.0–0.7)
Eosinophils Relative: 1 % (ref 0–5)
HEMATOCRIT: 43.8 % (ref 36.0–46.0)
HEMOGLOBIN: 14.9 g/dL (ref 12.0–15.0)
LYMPHS ABS: 1.9 10*3/uL (ref 0.7–4.0)
Lymphocytes Relative: 29 % (ref 12–46)
MCH: 31.7 pg (ref 26.0–34.0)
MCHC: 34 g/dL (ref 30.0–36.0)
MCV: 93.2 fL (ref 78.0–100.0)
MONOS PCT: 5 % (ref 3–12)
MPV: 9.5 fL (ref 8.6–12.4)
Monocytes Absolute: 0.3 10*3/uL (ref 0.1–1.0)
NEUTROS ABS: 4.3 10*3/uL (ref 1.7–7.7)
Neutrophils Relative %: 65 % (ref 43–77)
Platelets: 311 10*3/uL (ref 150–400)
RBC: 4.7 MIL/uL (ref 3.87–5.11)
RDW: 12.8 % (ref 11.5–15.5)
WBC: 6.6 10*3/uL (ref 4.0–10.5)

## 2015-02-21 LAB — COMPLETE METABOLIC PANEL WITH GFR
ALK PHOS: 71 U/L (ref 33–115)
ALT: 21 U/L (ref 6–29)
AST: 18 U/L (ref 10–30)
Albumin: 4 g/dL (ref 3.6–5.1)
BUN: 7 mg/dL (ref 7–25)
CALCIUM: 9.3 mg/dL (ref 8.6–10.2)
CO2: 27 mmol/L (ref 20–31)
Chloride: 106 mmol/L (ref 98–110)
Creat: 0.69 mg/dL (ref 0.50–1.10)
GFR, Est Non African American: >89 mL/min (ref >=60)
Glucose, Bld: 86 mg/dL (ref 65–99)
POTASSIUM: 4.2 mmol/L (ref 3.5–5.3)
Sodium: 141 mmol/L (ref 135–146)
Total Bilirubin: 0.6 mg/dL (ref 0.2–1.2)
Total Protein: 6.5 g/dL (ref 6.1–8.1)

## 2015-02-21 LAB — MAGNESIUM: Magnesium: 1.8 mg/dL (ref 1.5–2.5)

## 2015-02-21 LAB — LIPID PANEL
CHOL/HDL RATIO: 1.9 ratio (ref <=5.0)
CHOLESTEROL: 132 mg/dL (ref 125–200)
HDL: 70 mg/dL (ref >=46)
LDL Cholesterol: 49 mg/dL (ref <130)
Triglycerides: 64 mg/dL (ref <150)
VLDL: 13 mg/dL (ref <30)

## 2015-02-21 LAB — PHOSPHORUS: Phosphorus: 4.2 mg/dL (ref 2.5–4.5)

## 2015-02-21 LAB — VITAMIN B12: Vitamin B-12: 493 pg/mL (ref 211–911)

## 2015-02-21 LAB — TSH: TSH: 2.847 u[IU]/mL (ref 0.350–4.500)

## 2015-02-21 MED ORDER — ESCITALOPRAM OXALATE 10 MG PO TABS: 10.0000 mg | ORAL_TABLET | Freq: Every day | ORAL | Status: DC

## 2015-02-21 MED ORDER — MIRTAZAPINE 15 MG PO TABS: 15.0000 mg | ORAL_TABLET | Freq: Every day | ORAL | Status: DC
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
## 2015-02-21 NOTE — Patient Instructions (Signed)
[email or web address]  Therapist - that eating disorders Carla Marletta [place]send Sara Young

## 2015-02-22 ENCOUNTER — Telehealth: Payer: Self-pay

## 2015-02-22 LAB — VITAMIN D 25 HYDROXY (VIT D DEFICIENCY, FRACTURES): VIT D 25 HYDROXY: 16 ng/mL — AB (ref 30–100)

## 2015-02-22 NOTE — Telephone Encounter (Signed)
Tried to reach patient regarding appointment with Sarah Estes.  I was going to leave a message but the mailblox was full.  [name]

## 2015-03-02 ENCOUNTER — Telehealth: Payer: Self-pay | Admitting: Family Medicine

## 2015-03-02 NOTE — Telephone Encounter (Signed)
See prior note.  D/c lexapro on med list and allergy added.

## 2015-03-02 NOTE — Telephone Encounter (Signed)
Outside call. Started on Lexapro 5 days ago. Last dose morning of phone call. Noticed few red bumps on skin in am, turned into hives as morning progressed. No dyspnea or respiratory symptoms, no sore throat, no oral/genital lesions. Took 1 benadryl 30 mins ago. No other new medications, no new dermatologic products.   Stop Lexapro, take benadryl 50mg  Q4-6 hr x first 24-48 hrs, then change to zyrtec QD for total 5-7 days of antihistamine. Zantac otc 150mg  BID. Follow up with Sarah in next week to discuss other med options as was changed to Lexapro from Cymbalta.

## 2015-05-13 ENCOUNTER — Other Ambulatory Visit: Payer: Self-pay | Admitting: Physician Assistant

## 2015-05-14 ENCOUNTER — Ambulatory Visit (INDEPENDENT_AMBULATORY_CARE_PROVIDER_SITE_OTHER): Payer: BC Managed Care – PPO | Admitting: Physician Assistant

## 2015-05-14 VITALS — BP 120/68 | HR 82 | Temp 98.0°F | Resp 20

## 2015-05-14 DIAGNOSIS — F5000 Anorexia nervosa, unspecified: Secondary | ICD-10-CM

## 2015-05-14 DIAGNOSIS — F514 Sleep terrors [night terrors]: Secondary | ICD-10-CM | POA: Diagnosis not present

## 2015-05-14 DIAGNOSIS — F411 Generalized anxiety disorder: Secondary | ICD-10-CM

## 2015-05-14 DIAGNOSIS — F4310 Post-traumatic stress disorder, unspecified: Secondary | ICD-10-CM

## 2015-05-14 DIAGNOSIS — F509 Eating disorder, unspecified: Secondary | ICD-10-CM

## 2015-05-14 MED ORDER — ESCITALOPRAM OXALATE 20 MG PO TABS: 20.0000 mg | ORAL_TABLET | Freq: Every day | ORAL | Status: DC

## 2015-05-14 MED ORDER — ALPRAZOLAM 1 MG PO TABS: 0.5000 mg | ORAL_TABLET | Freq: Two times a day (BID) | ORAL | Status: DC | PRN

## 2015-05-14 MED ORDER — MIRTAZAPINE 30 MG PO TABS: 30.0000 mg | ORAL_TABLET | Freq: Every day | ORAL | Status: DC
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
## 2015-05-15 ENCOUNTER — Telehealth: Payer: Self-pay

## 2015-05-15 NOTE — Telephone Encounter (Signed)
Patient is calling for school note.   [phone] (M)

## 2015-05-16 NOTE — Telephone Encounter (Signed)
Written up front for them to pick up

## 2015-05-18 NOTE — Telephone Encounter (Signed)
Called and lmom. Informed note ready for pickup. Gave names of psychiatrists. Called back if have questions.

## 2015-05-18 NOTE — Telephone Encounter (Signed)
Can you also let her know the following for recommendations for psychiatrist in town depending on what she finds out from the insurance  Dr. Lowella Dandy (she's a PA at [facility]).   Evern Bio (NP) with [place] office.Marland KitchenMarland Kitchen

## 2015-06-02 ENCOUNTER — Telehealth: Payer: Self-pay

## 2015-06-02 NOTE — Telephone Encounter (Signed)
Patient mother is calling to ask if Sarah Estes could write a letter stating why she had to withdraw from her classes. She states that Sarah Estes know the situation and will be able to help.  Patient would like to pick the letter up. Please call when ready! [phone]

## 2015-06-02 NOTE — Telephone Encounter (Signed)
Pt would like a letter for her apartment manager stating that she has to have a service dog for medical reasons. If not, she'll have to pay the pet fee and she doesn't want to do that. Please call patient to inform her when the letter is ready to be picked up.

## 2015-06-02 NOTE — Telephone Encounter (Signed)
Sarah  Please see previous message 

## 2015-06-03 NOTE — Telephone Encounter (Signed)
I have written the notes - they are in the Rx box in the front.  Please check to see where the status is about pysch for the patients further medicine treatment

## 2015-06-03 NOTE — Telephone Encounter (Signed)
Pt's mom notified

## 2015-06-24 NOTE — Telephone Encounter (Signed)
Sarah Estes,   Sarah Estes, Sarah Estes called and stated her daughter (patient) has TWO dogS.  The letter previously written needs to be revised to reflect more than one dog for the apartment complex she recently moved into.   Sarah Estes  [phone]

## 2015-06-24 NOTE — Telephone Encounter (Signed)
Spoke with Sarah Estes, she states she will not revise the letter. One service dog is all she will write for.

## 2015-06-25 ENCOUNTER — Telehealth: Payer: Self-pay

## 2015-06-25 NOTE — Telephone Encounter (Signed)
I called and left her a message. Sarah declined request.

## 2015-06-25 NOTE — Telephone Encounter (Signed)
Patient stopped in for note for service DOGS she now has two   [phone]   NEW ADDRESS   [address]  [place], [place]  [phone]

## 2015-06-25 NOTE — Telephone Encounter (Signed)
Left detailed message letting pt knowthe letter could not be provided.

## 2015-07-01 ENCOUNTER — Ambulatory Visit (INDEPENDENT_AMBULATORY_CARE_PROVIDER_SITE_OTHER): Payer: BC Managed Care – PPO | Admitting: Physician Assistant

## 2015-07-01 VITALS — BP 110/70 | HR 80 | Temp 97.7°F | Resp 16

## 2015-07-01 DIAGNOSIS — F411 Generalized anxiety disorder: Secondary | ICD-10-CM | POA: Diagnosis not present

## 2015-07-01 DIAGNOSIS — F514 Sleep terrors [night terrors]: Secondary | ICD-10-CM

## 2015-07-01 DIAGNOSIS — F4310 Post-traumatic stress disorder, unspecified: Secondary | ICD-10-CM

## 2015-07-01 DIAGNOSIS — G47411 Narcolepsy with cataplexy: Secondary | ICD-10-CM | POA: Diagnosis not present

## 2015-07-01 DIAGNOSIS — F509 Eating disorder, unspecified: Secondary | ICD-10-CM

## 2015-07-01 DIAGNOSIS — F5000 Anorexia nervosa, unspecified: Secondary | ICD-10-CM | POA: Diagnosis not present

## 2015-07-01 MED ORDER — ESCITALOPRAM OXALATE 20 MG PO TABS: 30.0000 mg | ORAL_TABLET | Freq: Every day | ORAL | Status: DC

## 2015-07-01 NOTE — Patient Instructions (Signed)
     IF you received an x-ray today, you will receive an invoice from [facility]. Please contact [facility] at [phone] with questions or concerns regarding your invoice.   IF you received labwork today, you will receive an invoice from [facility]. Please contact [facility] at [phone] with questions or concerns regarding your invoice.   Our billing staff will not be able to assist you with questions regarding bills from these companies.  You will be contacted with the lab results as soon as they are available. The fastest way to get your results is to activate your My Chart account. Instructions are located on the last page of this paperwork. If you have not heard from us regarding the results in 2 weeks, please contact this office.      
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
## 2015-07-03 ENCOUNTER — Other Ambulatory Visit: Payer: Self-pay | Admitting: Physician Assistant

## 2015-07-06 NOTE — Telephone Encounter (Signed)
Faxed

## 2015-07-06 NOTE — Telephone Encounter (Signed)
Done

## 2015-07-08 ENCOUNTER — Encounter: Payer: Self-pay | Admitting: Neurology

## 2015-07-08 ENCOUNTER — Ambulatory Visit (INDEPENDENT_AMBULATORY_CARE_PROVIDER_SITE_OTHER): Payer: BC Managed Care – PPO | Admitting: Neurology

## 2015-07-08 VITALS — BP 120/84 | HR 88 | Resp 20 | Ht 69.0 in | Wt 178.0 lb

## 2015-07-08 DIAGNOSIS — G4753 Recurrent isolated sleep paralysis: Secondary | ICD-10-CM | POA: Diagnosis not present

## 2015-07-08 DIAGNOSIS — F445 Conversion disorder with seizures or convulsions: Secondary | ICD-10-CM

## 2015-07-08 DIAGNOSIS — R569 Unspecified convulsions: Secondary | ICD-10-CM

## 2015-07-08 DIAGNOSIS — G4719 Other hypersomnia: Secondary | ICD-10-CM | POA: Diagnosis not present

## 2015-07-08 MED ORDER — MODAFINIL 200 MG PO TABS: 200.0000 mg | ORAL_TABLET | Freq: Every day | ORAL | Status: DC

## 2015-07-08 MED ORDER — ESZOPICLONE 2 MG PO TABS: 2.0000 mg | ORAL_TABLET | Freq: Every evening | ORAL | Status: DC | PRN

## 2015-07-08 NOTE — Patient Instructions (Signed)
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
## 2015-07-12 ENCOUNTER — Telehealth: Payer: Self-pay

## 2015-07-12 NOTE — Telephone Encounter (Signed)
Completed PA for modafinil, submitted to [facility], should received a determination in 1-3 business days.

## 2015-07-14 NOTE — Telephone Encounter (Signed)
I spoke to pt and advised her that modafinil was denied by her insurance, and Dr. Vickey Hugerohmeier recommends waiting until after pt's sleep studies are completed to resubmit the information to pt's insurance and then hopefully the RX will be approved. Pt verbalized understanding.  Pt wants an RX for adderall 30 mg IR BID to get her through in the meantime. Her sleep studies are not scheduled until 5/14/ and 5/15.  I advised her that I would ask Dr. Doristine Locksohemeir and I would call her back.

## 2015-07-14 NOTE — Telephone Encounter (Signed)
Coverage for modafinil was denied because pt does not have a diagnosis of narcolepsy confirmed by PSG/MSLT, shift work disorder, or osa confirmed by PSG.  Pt is scheduled for her sleep studies on 5/14 and 5/15. Would you like me to wait for these results or begin an appeal?

## 2015-07-14 NOTE — Telephone Encounter (Signed)
wait

## 2015-07-15 NOTE — Telephone Encounter (Signed)
I saw patient in 2014 for pseudotumor cerebri and she did not follow up. I can see her back to manage that issue. In terms of her current narcolepsy/cataplexy spells, I do not plan to manage this. I would defer to your management. We can discuss in person tomorrow in clinic. -VRP

## 2015-07-15 NOTE — Telephone Encounter (Signed)
I will ask Dr. Marjory LiesPenumalli.  I do not see a contraindication to adderall for the interval period.

## 2015-07-16 NOTE — Telephone Encounter (Signed)
I usually don't have pseudotumour patients on stimulants- that's a new situation. She has never been before clearly worked up , but presented as diagnosed with narcolepsy. The history of pseudotumour may not be relevant as she lost so much weight. If you also don't feel its a contraindication , I will  go ahead and order the stimulant. CD

## 2015-07-19 MED ORDER — AMPHETAMINE-DEXTROAMPHET ER 15 MG PO CP24: 15.0000 mg | ORAL_CAPSULE | ORAL | Status: DC

## 2015-07-19 NOTE — Addendum Note (Signed)
Addended by: Melvyn NovasHMEIER, Rainey Rodger on: 07/19/2015 11:28 AM   Modules accepted: Orders

## 2015-07-19 NOTE — Telephone Encounter (Signed)
If you and Dr. Marjory LiesPenumalli are agreeable to using adderall for this pt, will you place the order?  If not, please advise me on what to tell this pt.

## 2015-07-19 NOTE — Telephone Encounter (Signed)
I spoke to pt and advised her that her RX for adderall is ready for pick up at the front desk and gave clinic hours.Pt verbalized understanding.

## 2015-07-19 NOTE — Telephone Encounter (Signed)
Yes, i will.

## 2015-07-20 NOTE — Telephone Encounter (Signed)
Completed pa for adderall. Sent to [facility]. Should receive a determination in 3-5 business days.

## 2015-07-21 NOTE — Telephone Encounter (Signed)
I called pt to discuss [email or web address]. No answer, left a message asking her to call me back.

## 2015-07-21 NOTE — Telephone Encounter (Signed)
PA for adderall denied because pt does not have a diagnosis of ADHD, ADD. Narcolepsy shown by a sleep study, or moderate to severe binge eating disorder.  I called pt to discuss and recommend [email or web address].  No answer, left a message asking pt to call me back.

## 2015-07-21 NOTE — Telephone Encounter (Signed)
Spoke to pt and advised her that her adderall PA was denied but I recommended trying [email or web address] for a coupon if she still wants to use the adderall and pay out of pocket. Pt verbalized understanding.

## 2015-07-21 NOTE — Telephone Encounter (Signed)
Pt returned [name]'s call. Operator relayed the message. Pt understood

## 2015-07-31 ENCOUNTER — Telehealth: Payer: Self-pay

## 2015-07-31 NOTE — Telephone Encounter (Signed)
Pt's mom came in to see if Sarah AbedSarah W.  could change the date on a letter she wrote for Sarah Estes  earlier. 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 There is a note similar to this from 05/14/15. Pt would just like the date changed to 07/01/15. Please advise mom at [phone]

## 2015-08-05 NOTE — Telephone Encounter (Signed)
Routed a new letter to the pool

## 2015-08-06 NOTE — Telephone Encounter (Signed)
Letter ready to pick up, left on voicemail.

## 2015-08-15 ENCOUNTER — Ambulatory Visit (INDEPENDENT_AMBULATORY_CARE_PROVIDER_SITE_OTHER): Payer: BC Managed Care – PPO | Admitting: Neurology

## 2015-08-15 DIAGNOSIS — G4719 Other hypersomnia: Secondary | ICD-10-CM

## 2015-08-15 DIAGNOSIS — F445 Conversion disorder with seizures or convulsions: Secondary | ICD-10-CM

## 2015-08-15 DIAGNOSIS — G4753 Recurrent isolated sleep paralysis: Secondary | ICD-10-CM

## 2015-08-15 DIAGNOSIS — G4710 Hypersomnia, unspecified: Secondary | ICD-10-CM | POA: Diagnosis not present

## 2015-08-15 DIAGNOSIS — R569 Unspecified convulsions: Secondary | ICD-10-CM

## 2015-08-16 ENCOUNTER — Telehealth: Payer: Self-pay

## 2015-08-16 ENCOUNTER — Other Ambulatory Visit (INDEPENDENT_AMBULATORY_CARE_PROVIDER_SITE_OTHER): Payer: Self-pay

## 2015-08-16 ENCOUNTER — Other Ambulatory Visit: Payer: Self-pay

## 2015-08-16 ENCOUNTER — Encounter (INDEPENDENT_AMBULATORY_CARE_PROVIDER_SITE_OTHER): Payer: BC Managed Care – PPO | Admitting: Neurology

## 2015-08-16 DIAGNOSIS — G4753 Recurrent isolated sleep paralysis: Secondary | ICD-10-CM | POA: Diagnosis not present

## 2015-08-16 DIAGNOSIS — F445 Conversion disorder with seizures or convulsions: Secondary | ICD-10-CM

## 2015-08-16 DIAGNOSIS — G4719 Other hypersomnia: Secondary | ICD-10-CM

## 2015-08-16 DIAGNOSIS — Z0289 Encounter for other administrative examinations: Secondary | ICD-10-CM

## 2015-08-16 DIAGNOSIS — R569 Unspecified convulsions: Secondary | ICD-10-CM

## 2015-08-16 NOTE — Addendum Note (Signed)
Addended by: Geronimo RunningINKINS, Rodneshia Greenhouse A on: 08/16/2015 10:04 AM   Modules accepted: Orders

## 2015-08-16 NOTE — Addendum Note (Signed)
Addended by: Geronimo RunningINKINS, Redford Behrle A on: 08/16/2015 09:56 AM   Modules accepted: Orders

## 2015-08-16 NOTE — Telephone Encounter (Signed)
Pt has arrived her for MSLT. Per protocol, a UDS needs to be performed, per Zella Ballobin, sleep Personal assistantlab manager.

## 2015-08-22 LAB — COMPREHENSIVE DRUG ANALYSIS,UR

## 2015-08-23 NOTE — Telephone Encounter (Signed)
Patient called to request results of sleep study.  

## 2015-08-23 NOTE — Telephone Encounter (Signed)
Spoke to pt regarding her sleep study results. I advised her that her PSG did not reveal significant sleep apnea or significant PLMS of sleep. The MSLT is consistent with hypersomnolence, and given the clinical scenario, is consistent with narcolepsy. Pt verbalized understanding of results. I advised pt that an appt needs to be made to discuss treatment with Dr. Vickey Hugerohmeier but there is an outstanding balance. Pt knows to make payment plans for this balance and then call us back to schedule an appt. Pt had no questions at this time but was encouraged to call back if questions arise.

## 2015-09-01 ENCOUNTER — Telehealth: Payer: Self-pay | Admitting: Neurology

## 2015-09-01 NOTE — Telephone Encounter (Signed)
Pt has completed her MSLT. She is questioning if it is ok to resume her lexapro. ( I assume it is ok, I just want to make sure).

## 2015-09-01 NOTE — Telephone Encounter (Signed)
Pt called and needs to know if she needs to start taking escitalopram (LEXAPRO) 20 MG tablet again. She was asked to stop due to sleep study. Please call [phone]

## 2015-09-01 NOTE — Telephone Encounter (Signed)
I spoke to Sarah Estes and advised her that per Dr. Vickey Hugerohmeier, it is ok for her to resume her lexapro. Sarah Estes verbalized understanding.

## 2015-09-01 NOTE — Telephone Encounter (Signed)
Yes, it is!

## 2015-09-02 ENCOUNTER — Telehealth: Payer: Self-pay

## 2015-09-02 NOTE — Telephone Encounter (Signed)
PA for adderall XR 15mg  was approved by [facility] until 08/25/2018.   I called pt. She says she knew this information already.

## 2015-09-07 ENCOUNTER — Telehealth: Payer: Self-pay | Admitting: Neurology

## 2015-09-07 NOTE — Telephone Encounter (Signed)
Pt called in stating her medication doesn't seem to be working for her narcolepsy. She is scheduled to come in 6/13. She would like to speak with nurse or physician.

## 2015-09-07 NOTE — Telephone Encounter (Signed)
I spoke to pt. She is currently taking adderall XR 15mg  in the morning for her narcolepsy. She says it is not working. She had to take 3 naps yesterday and then slept 13 hours that night. She fell asleep standing up at work and broke an expensive piece of equipment. Her provigil was denied by her insurance before we had her sleep study results back. I can try another prior auth and submit the sleep study results and perhaps the provigil denial will be overturned.  Pt has an appt on 6/13 with Dr. Vickey Hugerohmeier. She is wondering if she should change medications before her appt? She is also wondering if she should start the provigil (if insurance approves it) and keep taking the adderall 15mg ? Or should she just wait until her appt on 6/13 to discuss all of this with Dr. Vickey Hugerohmeier.

## 2015-09-08 NOTE — Telephone Encounter (Signed)
Please wait until appointment We will in the meanwhile try to overrule and appeal the nuvigil prescription from insurance.

## 2015-09-08 NOTE — Telephone Encounter (Signed)
I spoke to pt and advised her that Dr. Vickey Hugerohmeier says to wait until pt's appointment before changing any medications. I will try and appeal the nuvigil pa and get it approved in the meantime. Pt verbalized understanding.

## 2015-09-09 NOTE — Telephone Encounter (Signed)
I renewed pt's PA for modafinil. I am waiting for [facility] to respond with clinical questions.

## 2015-09-13 NOTE — Telephone Encounter (Signed)
[facility] to [facility] I advised representative that pt recently had a test and the denial for modafinil may be overturned with the results. Completed a pa for modafinil over the phone. It was approved until 09/12/2016. PA# [id number].  Pt is coming in for her appt tomorrow.

## 2015-09-14 ENCOUNTER — Encounter: Payer: Self-pay | Admitting: Neurology

## 2015-09-14 ENCOUNTER — Ambulatory Visit (INDEPENDENT_AMBULATORY_CARE_PROVIDER_SITE_OTHER): Payer: BC Managed Care – PPO | Admitting: Neurology

## 2015-09-14 VITALS — BP 110/66 | HR 76 | Resp 20 | Ht 69.0 in | Wt 185.0 lb

## 2015-09-14 DIAGNOSIS — G47411 Narcolepsy with cataplexy: Secondary | ICD-10-CM

## 2015-09-14 MED ORDER — AMPHETAMINE-DEXTROAMPHETAMINE 10 MG PO TABS: 10.0000 mg | ORAL_TABLET | Freq: Every day | ORAL | Status: DC

## 2015-09-14 MED ORDER — AMPHETAMINE-DEXTROAMPHETAMINE 10 MG PO TABS: ORAL_TABLET | ORAL | Status: DC

## 2015-09-14 NOTE — Patient Instructions (Addendum)
lexapro - this medication was weaned in preparation for the multiple sleep the the test. She needs to restart it. She will also have Adderall immediate release available as she is a gastric bypass patient, who can no longer take extended release medications. Her modafinil dose of 12 mg was preapproved the like for her to take it in the morning and if it doesn't last through the day sufficiently she Had a 10 mg Adderall. She has opted to Adderall available per day but I strongly hope that she will get by with 1.Modafinil tablets What is this medicine? MODAFINIL (moe DAF i nil) is used to treat excessive sleepiness caused by certain sleep disorders. This includes narcolepsy, sleep apnea, and shift work sleep disorder. This medicine may be used for other purposes; ask your health care provider or pharmacist if you have questions. What should I tell my health care provider before I take this medicine? They need to know if you have any of these conditions: -history of depression, mania, or other mental disorder -kidney disease -liver disease -an unusual or allergic reaction to modafinil, other medicines, foods, dyes, or preservatives -pregnant or trying to get pregnant -breast-feeding How should I use this medicine? Take this medicine by mouth with a glass of water. Follow the directions on the prescription label. Take your doses at regular intervals. Do not take your medicine more often than directed. Do not stop taking except on your doctor's advice. A special MedGuide will be given to you by the pharmacist with each prescription and refill. Be sure to read this information carefully each time. Talk to your pediatrician regarding the use of this medicine in children. This medicine is not approved for use in children. Overdosage: If you think you have taken too much of this medicine contact a poison control center or emergency room at once. NOTE: This medicine is only for you. Do not share this medicine  with others. What if I miss a dose? If you miss a dose, take it as soon as you can. If it is almost time for your next dose, take only that dose. Do not take double or extra doses. What may interact with this medicine? Do not take this medicine with any of the following medications: -amphetamine or dextroamphetamine -dexmethylphenidate or methylphenidate -medicines called MAO Inhibitors like Nardil, Parnate, Marplan, Eldepryl -pemoline -procarbazine This medicine may also interact with the following medications: -antifungal medicines like itraconazole or ketoconazole -barbiturates like phenobarbital -birth control pills or other hormone-containing birth control devices or implants -carbamazepine -cyclosporine -diazepam -medicines for depression, anxiety, or psychotic disturbances -phenytoin -propranolol -triazolam -warfarin This list may not describe all possible interactions. Give your health care provider a list of all the medicines, herbs, non-prescription drugs, or dietary supplements you use. Also tell them if you smoke, drink alcohol, or use illegal drugs. Some items may interact with your medicine. What should I watch for while using this medicine? Visit your doctor or health care professional for regular checks on your progress. The full effects of this medicine may not be seen right away. This medicine may affect your concentration, function, or may hide signs that you are tired. You may get dizzy. Do not drive, use machinery, or do anything that needs mental alertness until you know how this drug affects you. Alcohol can make you more dizzy and may interfere with your response to this medicine or your alertness. Avoid alcoholic drinks. Birth control pills may not work properly while you are taking this medicine. Talk  to your doctor about using an extra method of birth control. It is unknown if the effects of this medicine will be increased by the use of caffeine. Caffeine is  available in many foods, beverages, and medications. Ask your doctor if you should limit or change your intake of caffeine-containing products while on this medicine. What side effects may I notice from receiving this medicine? Side effects that you should report to your doctor or health care professional as soon as possible: -allergic reactions like skin rash, itching or hives, swelling of the face, lips, or tongue -anxiety -breathing problems -chest pain -fast, irregular heartbeat -hallucinations -increased blood pressure -redness, blistering, peeling or loosening of the skin, including inside the mouth -sore throat, fever, or chills -suicidal thoughts or other mood changes -tremors -vomiting Side effects that usually do not require medical attention (report to your doctor or health care professional if they continue or are bothersome): -headache -nausea, diarrhea, or stomach upset -nervousness -trouble sleeping This list may not describe all possible side effects. Call your doctor for medical advice about side effects. You may report side effects to FDA at 1-800-FDA-1088. Where should I keep my medicine? Keep out of the reach of children. This medicine can be abused. Keep your medicine in a safe place to protect it from theft. Do not share this medicine with anyone. Selling or giving away this medicine is dangerous and against the law. This medicine may cause accidental overdose and death if taken by other adults, children, or pets. Mix any unused medicine with a substance like cat litter or coffee grounds. Then throw the medicine away in a sealed container like a sealed bag or a coffee can with a lid. Do not use the medicine after the expiration date. Store at room temperature between 20 and 25 degrees C (68 and 77 degrees F). NOTE: This sheet is a summary. It may not cover all possible information. If you have questions about this medicine, talk to your doctor, pharmacist, or health  care provider.    2016, [employer]vier/Gold Standard. (2013-12-09 15:34:55)   To Whom It May Concern,  09-14-2015  My patient Sarah Estes, born 14th of June 1992, has just been diagnosed with hypersomnia, aquiered narcolepsy. 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 She suffers from a sleep disorder but does make her struggle all day with irresistible urge to go to sleep and her sleep is fragmented by vivid dreams. Since her tests on 08/15/2015 have confirmed the presence of this disorder she will be treated with a medication that helps her to maintain alertness.. She can resume classes on June 29 of this year on medication.  Melvyn Novasarmen Orel Cooler, MD

## 2015-09-14 NOTE — Progress Notes (Signed)
Pt went to check out and is requesting the adderall RX say that she may take 1-2 tablets of adderall daily as per a discussion with Dr. Vickey Hugerohmeier. I spoke to Dr. Vickey Hugerohmeier, and she is agreeable to this. A new script was printed for the updated adderall RX and given to the pt. Original adderall RX printed today for adderall to be taken once daily shredded.

## 2015-09-14 NOTE — Addendum Note (Signed)
Addended by: Geronimo RunningINKINS, Keygan Dumond A on: 09/14/2015 04:13 PM   Modules accepted: Orders
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
## 2015-09-23 ENCOUNTER — Telehealth: Payer: Self-pay | Admitting: Neurology

## 2015-09-23 DIAGNOSIS — G4753 Recurrent isolated sleep paralysis: Secondary | ICD-10-CM

## 2015-09-23 DIAGNOSIS — F445 Conversion disorder with seizures or convulsions: Secondary | ICD-10-CM

## 2015-09-23 DIAGNOSIS — G4719 Other hypersomnia: Secondary | ICD-10-CM

## 2015-09-23 DIAGNOSIS — R569 Unspecified convulsions: Secondary | ICD-10-CM

## 2015-09-23 NOTE — Telephone Encounter (Signed)
I was under the impression that pt should take modafinil and use the adderall as supplement if needed.  I called pt to discuss. No answer. VM is full and I could not leave a message. Will try again later.  Pt does not have a DPR on file with [facility] that authorizes us to speak with pt's mother, therefore, I cannot speak with her.

## 2015-09-23 NOTE — Telephone Encounter (Signed)
Patient's mother is calling and states that her daughter was given medication after her sleep study and report that the modafinil is not helping her to stay awake and she is having to take Adderall to compensate. Please call.

## 2015-09-27 MED ORDER — MODAFINIL 200 MG PO TABS: ORAL_TABLET | ORAL | Status: DC

## 2015-09-27 NOTE — Telephone Encounter (Signed)
Patient is calling to speak with you.  Please call [phone]/

## 2015-09-27 NOTE — Telephone Encounter (Signed)
I called pt again, no answer, VM was full, unable to leave a message.

## 2015-09-27 NOTE — Telephone Encounter (Signed)
I called the patient. The patient is on Provigil taking 1 in the morning, she is taking one or 2 of the Adderall tablets a day, she is still having trouble with feeling sleepy during the day. We will go up on the Provigil taking 1 in the morning and 1 at noon. The patient will see how this works out over the next couple weeks.

## 2015-09-27 NOTE — Telephone Encounter (Signed)
I spoke to pt. Pt says that the modafinil is not working for her. She is still having to pull over on the side of the road to nap. She is still falling asleep while eating. She feels that the modafinil is not helping and that the adderall seems to help a little bit. She is wondering if the adderall 10mg  can be increased? She is currently taking 1-2 tablets per day.  I advised pt that Dr. Vickey Hugerohmeier is out of the office for the next two weeks and [facility] Pt is agreeable to this.

## 2015-09-27 NOTE — Telephone Encounter (Signed)
Pt's mother called back. The phone staff advised her that we cannot give any information to her because she is not listed on pt's DPR. Pt's mother still insisted that she speak to me. Call was transferred to me.  I answered the call. Pt's mother tells me that pt has been taking the modafinil for 2 weeks now and "it is like she is taking candy". She is having to take the adderall 10mg  at least 2 tablets and sometimes 3 tablets to stay awake. Pt's mother thinks the adderall needs to be increased.  I advised mother that since she is not on the DPR, I cannot release any medical information to her, and the pt will need to call me back to discuss. Pt's mother asked that I call pt again. She will call the pt now and tell her to answer her phone since the pt is at work. Pt's mother is asking that I call the pt right at 2:30.  I called the pt exactly at 2:30 again, no answer, no VM set up.

## 2015-11-11 ENCOUNTER — Ambulatory Visit (INDEPENDENT_AMBULATORY_CARE_PROVIDER_SITE_OTHER): Payer: BC Managed Care – PPO | Admitting: Neurology

## 2015-11-11 ENCOUNTER — Encounter: Payer: Self-pay | Admitting: Neurology

## 2015-11-11 VITALS — BP 140/88 | HR 72 | Resp 20 | Ht 69.0 in | Wt 198.0 lb

## 2015-11-11 DIAGNOSIS — G47411 Narcolepsy with cataplexy: Secondary | ICD-10-CM | POA: Diagnosis not present

## 2015-11-11 MED ORDER — SODIUM OXYBATE 500 MG/ML PO SOLN: 2500.0000 mg | Freq: Two times a day (BID) | ORAL | 1 refills | Status: DC

## 2015-11-11 NOTE — Patient Instructions (Signed)
As discussed, Xyrem has to be taken with very mindful caution: Taking Xyrem correctly is key. This means, take it only when you are fully ready to fall asleep, while in bed and refrain from doing any other activities, even brushing  your teeth after taking your first dose. The second dose will be about 2-1/2-4 hours after his first dose. You can go to the bathroom before your 2nd dose. Take your first dose, when actually IN BED, ready to sleep. No sitting up in bed, NO reading, NO using the cell phone or computer, NO getting up to use the bathroom. Take care of everything BEFORE sleep time. Try NOT to skip the second dose as the Xyrem is not going to stay in your system long enough with only one dose. Do not drink alcohol with Xyrem. If you do drink Alcohol, you cannot take your Xyrem doses that night.   Narcolepsy Narcolepsy is a nervous system disorder that causes daytime sleepiness and sudden bouts of irresistible sleep during the day (sleep attacks). Many people with narcolepsy live with extreme daytime sleepiness for many years before being diagnosed and treated. Narcolepsy is a lifelong (chronic) disorder.  You normally go through cycles when you sleep. When your sleep becomes deeper, you have less body movement, and you start dreaming. This type of deep sleep should happen after about 90 minutes of lighter sleep. Deep sleep is called rapid eye movement (REM) sleep. When you have narcolepsy, REM is not well regulated. It can happen as soon as you fall asleep, and components of REM sleep can occur during the day when you are awake. Uncontrolled REM sleep causes symptoms of narcolepsy. CAUSES Narcolepsy may be caused by an abnormality with a chemical messenger (neurotransmitter) in your brain that controls your sleep and wake cycles. Most people with narcolepsy have low levels of the neurotransmitter hypocretin. Hypocretin is important for controlling wakefulness.  A hypocretin imbalance may be caused by  abnormal genes that are passed down through families. It may also develop if the body's defense system (immune system) mistakenly attacks the brain cells that produce hypocretin (autoimmune disease). RISK FACTORS You may be at higher risk for narcolepsy if you have a family history of the disease. Other risk factors that may contribute include:  Injuries, tumors, or infections in the areas of the brain that control sleep.  Exposure to toxins.  Stress.  Hormones produced during puberty and menopause.  Poor sleep habits. SIGNS AND SYMPTOMS  Symptoms of narcolepsy can start at any age but usually begin when people are between the ages of 110 and 25 years. There are four major symptoms. Not everyone with narcolepsy will have all four.   Excessive daytime sleepiness. This is the most common symptom and is usually the first symptom you will notice.  You may feel as if you are in a mental fog.  Daytime sleepiness may severely affect your performance at work or school.  You may fall asleep during a conversation or while eating dinner.  Sudden loss of muscle tone (cataplexy). You do not lose consciousness, but you may suddenly lose muscle control. When this occurs, your speech may become slurred, or your knees may buckle. This symptom is usually triggered by surprise, anger, or laughter.  Sleep paralysis. You may lose the ability to speak or move just as you start to fall asleep or wake up. You will be aware of the paralysis. It usually lasts for just a few seconds or minutes.  Vivid hallucinations. These  may occur with sleep paralysis. The hallucinations are like having bizarre or frightening dreams while you are still awake. Other symptoms may include:   Trouble staying asleep at night (insomnia).  Restless sleep.  Feeling a strong urge to get up at night to smoke or eat. DIAGNOSIS  Your health care provider can diagnose narcolepsy based on your symptoms and the results of two diagnostic  tests. You may also be asked to keep a sleep diary for several weeks. You may need the tests if you have had daytime sleepiness for at least 3 months. The two tests are:   A polysomnogram to find out how well your REM sleep is regulated at night. This test is an overnight sleep study. It measures your heart rate, breathing, movement, and brain waves.  A multiple sleep latency test (MSLT) to find out how well your REM sleep is regulated during the day. This is a daytime sleep study. You may need to take several naps during the day. This test also measures your heart rate, breathing, movement, and brain waves. TREATMENT  There is no cure for narcolepsy, but treatment can be very effective in helping manage the condition. Treatment may include:  Lifestyle and sleeping strategies to help cope with the condition.  Medicines. These may include:  Stimulant medicines to fight daytime sleepiness.  Antidepressant medicines to treat cataplexy.  Sodium oxybate. This is a strong sedative that you take at night. It can help daytime sleepiness and cataplexy. HOME CARE INSTRUCTIONS  Take all medicines as directed by your health care provider.  Follow these sleep practices:  Try to get about 8 hours of sleep every night.  Go to sleep and get up close to the same time every day.  Keep your bedroom dark, quiet, and comfortable.  Schedule short naps for when you feel sleepiest during the day. Tell your employer or teachers that you have narcolepsy. You may be able to adjust your schedule to include time for naps.  Try to get at least 20 minutes of exercise every day. This will help you sleep better at night and reduce daytime sleepiness.  Do not drink alcohol or caffeinated beverages within 4-5 hours of bedtime.  Do not eat a heavy meal before bedtime. Eat at about the same times every day.  Do not smoke.  Do not drive if you are sleepy or have untreated narcolepsy.  Do not swim or go out on the  water without a life jacket. SEEK MEDICAL CARE IF: Your medicines are not controlling your narcolepsy symptoms. SEEK IMMEDIATE MEDICAL CARE IF:  You hurt yourself during a sleep attack or an attack of cataplexy.  You have chest pain or trouble breathing.   This information is not intended to replace advice given to you by your health care provider. Make sure you discuss any questions you have with your health care provider.   Document Released: 03/10/2002 Document Revised: 04/10/2014 Document Reviewed: 03/12/2013 [employer] Interactive Patient Education [employer]
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
## 2015-11-12 LAB — COMPREHENSIVE METABOLIC PANEL
ALT: 18 IU/L (ref 0–32)
AST: 19 IU/L (ref 0–40)
Albumin/Globulin Ratio: 1.8 (ref 1.2–2.2)
Albumin: 4.1 g/dL (ref 3.5–5.5)
Alkaline Phosphatase: 88 IU/L (ref 39–117)
BUN/Creatinine Ratio: 19 (ref 9–23)
BUN: 12 mg/dL (ref 6–20)
Bilirubin Total: 0.3 mg/dL (ref 0.0–1.2)
CALCIUM: 9.3 mg/dL (ref 8.7–10.2)
CO2: 24 mmol/L (ref 18–29)
CREATININE: 0.63 mg/dL (ref 0.57–1.00)
Chloride: 104 mmol/L (ref 96–106)
GFR calc Af Amer: 146 mL/min/{1.73_m2} (ref >59)
GFR, EST NON AFRICAN AMERICAN: 127 mL/min/{1.73_m2} (ref >59)
GLUCOSE: 90 mg/dL (ref 65–99)
Globulin, Total: 2.3 g/dL (ref 1.5–4.5)
Potassium: 5.1 mmol/L (ref 3.5–5.2)
Sodium: 144 mmol/L (ref 134–144)
Total Protein: 6.4 g/dL (ref 6.0–8.5)

## 2015-11-12 LAB — CBC WITH DIFFERENTIAL/PLATELET
BASOS: 0 %
Basophils Absolute: 0 10*3/uL (ref 0.0–0.2)
EOS (ABSOLUTE): 0.2 10*3/uL (ref 0.0–0.4)
EOS: 2 %
HEMATOCRIT: 38.8 % (ref 34.0–46.6)
Hemoglobin: 12.8 g/dL (ref 11.1–15.9)
IMMATURE GRANS (ABS): 0 10*3/uL (ref 0.0–0.1)
IMMATURE GRANULOCYTES: 0 %
LYMPHS: 20 %
Lymphocytes Absolute: 2 10*3/uL (ref 0.7–3.1)
MCH: 30 pg (ref 26.6–33.0)
MCHC: 33 g/dL (ref 31.5–35.7)
MCV: 91 fL (ref 79–97)
Monocytes Absolute: 0.6 10*3/uL (ref 0.1–0.9)
Monocytes: 6 %
NEUTROS PCT: 72 %
Neutrophils Absolute: 7.2 10*3/uL — ABNORMAL HIGH (ref 1.4–7.0)
PLATELETS: 337 10*3/uL (ref 150–379)
RBC: 4.26 x10E6/uL (ref 3.77–5.28)
RDW: 15.5 % — ABNORMAL HIGH (ref 12.3–15.4)
WBC: 10 10*3/uL (ref 3.4–10.8)

## 2015-11-16 ENCOUNTER — Telehealth: Payer: Self-pay

## 2015-11-16 NOTE — Telephone Encounter (Signed)
-----   Message from Melvyn Novasarmen Dohmeier, MD sent at 11/15/2015  5:46 PM EDT ----- Normal metabolic panel -but increased white blood cell count of the neutrophil type.  This can be related to a bacterial infection, the overall count is not concerning. The patient is able to continue her current medication with these values.

## 2015-11-16 NOTE — Telephone Encounter (Signed)
I spoke to pt and advised her that her labs were normal except an increased neutrophil count which could be related to a bacterial infection. I also advised her that the pa for xyrem was approved. Pt verbalized understanding of results. Pt had no questions at this time but was encouraged to call back if questions arise.

## 2015-11-16 NOTE — Telephone Encounter (Signed)
Xyrem has been approved by [facility] from 11/16/15-11/15/16. PA# [id number]  Faxed copy of approval letter to xyrem. Received a receipt of confirmation.

## 2015-11-16 NOTE — Telephone Encounter (Signed)
PA for xyrem completed. Sent to [employer]. Should have a determination in 3-5 business days.

## 2015-11-25 ENCOUNTER — Other Ambulatory Visit: Payer: Self-pay | Admitting: Physician Assistant

## 2015-11-25 DIAGNOSIS — F411 Generalized anxiety disorder: Secondary | ICD-10-CM

## 2015-11-25 DIAGNOSIS — F4310 Post-traumatic stress disorder, unspecified: Secondary | ICD-10-CM

## 2015-11-29 ENCOUNTER — Telehealth: Payer: Self-pay | Admitting: Neurology

## 2015-11-29 ENCOUNTER — Other Ambulatory Visit: Payer: Self-pay

## 2015-11-29 ENCOUNTER — Other Ambulatory Visit: Payer: Self-pay | Admitting: Neurology

## 2015-11-29 DIAGNOSIS — Z79899 Other long term (current) drug therapy: Secondary | ICD-10-CM

## 2015-11-29 MED ORDER — AMPHETAMINE-DEXTROAMPHETAMINE 10 MG PO TABS: ORAL_TABLET | ORAL | 0 refills | Status: DC

## 2015-11-29 NOTE — Telephone Encounter (Signed)
Pt called to advise she is will wake up in the morning she has briuses on her. She doesn't know what is happening while she is sleeping. She sts Xyreme drug reaction specialist advised her to call to let provider know of the side effects

## 2015-11-29 NOTE — Telephone Encounter (Signed)
Pt request refill for amphetamine-dextroamphetamine (ADDERALL) 10 MG tablet °

## 2015-11-29 NOTE — Telephone Encounter (Signed)
Bruising can come  from ITP, I will ask her to come by for platelet and PLFt test.

## 2015-11-29 NOTE — Telephone Encounter (Signed)
Pt's RX for adderall is ready for pick up at the front desk.
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
## 2015-11-29 NOTE — Telephone Encounter (Signed)
I spoke to pt. She is agreeable to coming in for blood work. I also advised her that her adderall RX is ready for pick up at the front desk. Pt verbalized understanding. She will come by the office for both tomorrow.

## 2015-11-30 ENCOUNTER — Other Ambulatory Visit (INDEPENDENT_AMBULATORY_CARE_PROVIDER_SITE_OTHER): Payer: Self-pay

## 2015-11-30 ENCOUNTER — Telehealth: Payer: Self-pay | Admitting: Neurology

## 2015-11-30 DIAGNOSIS — Z79899 Other long term (current) drug therapy: Secondary | ICD-10-CM

## 2015-11-30 DIAGNOSIS — Z0289 Encounter for other administrative examinations: Secondary | ICD-10-CM

## 2015-11-30 NOTE — Telephone Encounter (Signed)
L[facility] called to report patient's loss of XYREM due to spilling one of the doses, requests early refill to ship one day early next shipping date 9/8 instead of 9/9. Please call [phone] option 3 option 4.

## 2015-11-30 NOTE — Telephone Encounter (Signed)
I spoke to Dr. Vickey Hugerohmeier. She is ok with the shipment of xyrem being one day earlier. I called and spoke with Baxter HireKristen, pharmacist at [facility] and advised her of this. Bellamarie Pflug verbalized understanding and will coordinate an earlier ship date for one day.

## 2015-12-01 LAB — CBC WITH DIFFERENTIAL/PLATELET
BASOS: 0 %
Basophils Absolute: 0 10*3/uL (ref 0.0–0.2)
EOS (ABSOLUTE): 0.1 10*3/uL (ref 0.0–0.4)
EOS: 1 %
HEMATOCRIT: 40.5 % (ref 34.0–46.6)
Hemoglobin: 13.5 g/dL (ref 11.1–15.9)
Immature Grans (Abs): 0 10*3/uL (ref 0.0–0.1)
Immature Granulocytes: 0 %
LYMPHS ABS: 1.6 10*3/uL (ref 0.7–3.1)
Lymphs: 24 %
MCH: 30.2 pg (ref 26.6–33.0)
MCHC: 33.3 g/dL (ref 31.5–35.7)
MCV: 91 fL (ref 79–97)
MONOS ABS: 0.3 10*3/uL (ref 0.1–0.9)
Monocytes: 4 %
Neutrophils Absolute: 4.8 10*3/uL (ref 1.4–7.0)
Neutrophils: 71 %
Platelets: 347 10*3/uL (ref 150–379)
RBC: 4.47 x10E6/uL (ref 3.77–5.28)
RDW: 15.7 % — AB (ref 12.3–15.4)
WBC: 6.8 10*3/uL (ref 3.4–10.8)

## 2015-12-01 LAB — COMPREHENSIVE METABOLIC PANEL
A/G RATIO: 1.8 (ref 1.2–2.2)
ALBUMIN: 4.5 g/dL (ref 3.5–5.5)
ALK PHOS: 96 IU/L (ref 39–117)
ALT: 35 IU/L — ABNORMAL HIGH (ref 0–32)
AST: 31 IU/L (ref 0–40)
BUN / CREAT RATIO: 14 (ref 9–23)
BUN: 10 mg/dL (ref 6–20)
Bilirubin Total: 0.3 mg/dL (ref 0.0–1.2)
CO2: 25 mmol/L (ref 18–29)
CREATININE: 0.69 mg/dL (ref 0.57–1.00)
Calcium: 9.6 mg/dL (ref 8.7–10.2)
Chloride: 104 mmol/L (ref 96–106)
GFR calc Af Amer: 142 mL/min/{1.73_m2} (ref >59)
GFR calc non Af Amer: 123 mL/min/{1.73_m2} (ref >59)
GLOBULIN, TOTAL: 2.5 g/dL (ref 1.5–4.5)
Glucose: 91 mg/dL (ref 65–99)
Potassium: 5.2 mmol/L (ref 3.5–5.2)
SODIUM: 144 mmol/L (ref 134–144)
Total Protein: 7 g/dL (ref 6.0–8.5)

## 2015-12-02 ENCOUNTER — Telehealth: Payer: Self-pay

## 2015-12-02 NOTE — Telephone Encounter (Signed)
I spoke to pt. I advised her that her labs were normal, ALT was just mildly elevated. Pt says that her bruising is going away. Pt says that the xyrem is very salty and wanted to know if her electrolytes looked ok in the labs. I advised her that her sodium, potassium, and chloride were all normal based on the 11/30/15 labs. Pt verbalized understanding of results. Pt had no questions at this time but was encouraged to call back if questions arise.

## 2015-12-02 NOTE — Telephone Encounter (Signed)
-----   Message from Melvyn Novasarmen Dohmeier, MD sent at 12/02/2015  4:03 PM EDT ----- All normal for our purpose.ALT is mildly elevated.

## 2015-12-03 ENCOUNTER — Encounter: Payer: Self-pay | Admitting: Neurology

## 2015-12-22 ENCOUNTER — Encounter: Payer: Self-pay | Admitting: Neurology

## 2015-12-22 ENCOUNTER — Ambulatory Visit (INDEPENDENT_AMBULATORY_CARE_PROVIDER_SITE_OTHER): Payer: BC Managed Care – PPO | Admitting: Neurology

## 2015-12-22 ENCOUNTER — Ambulatory Visit: Payer: BC Managed Care – PPO | Admitting: Neurology

## 2015-12-22 DIAGNOSIS — F445 Conversion disorder with seizures or convulsions: Secondary | ICD-10-CM

## 2015-12-22 DIAGNOSIS — G4719 Other hypersomnia: Secondary | ICD-10-CM

## 2015-12-22 DIAGNOSIS — G47411 Narcolepsy with cataplexy: Secondary | ICD-10-CM | POA: Diagnosis not present

## 2015-12-22 DIAGNOSIS — R569 Unspecified convulsions: Secondary | ICD-10-CM

## 2015-12-22 DIAGNOSIS — G4753 Recurrent isolated sleep paralysis: Secondary | ICD-10-CM | POA: Diagnosis not present

## 2015-12-22 MED ORDER — AMPHETAMINE-DEXTROAMPHETAMINE 10 MG PO TABS: ORAL_TABLET | ORAL | 0 refills | Status: DC

## 2015-12-22 MED ORDER — MODAFINIL 200 MG PO TABS: ORAL_TABLET | ORAL | 5 refills | Status: DC

## 2015-12-22 MED ORDER — SODIUM OXYBATE 500 MG/ML PO SOLN: ORAL | 1 refills | Status: DC
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
## 2015-12-23 LAB — COMPREHENSIVE METABOLIC PANEL
A/G RATIO: 1.8 (ref 1.2–2.2)
ALBUMIN: 4.6 g/dL (ref 3.5–5.5)
ALT: 12 IU/L (ref 0–32)
AST: 14 IU/L (ref 0–40)
Alkaline Phosphatase: 101 IU/L (ref 39–117)
BUN / CREAT RATIO: 11 (ref 9–23)
BUN: 7 mg/dL (ref 6–20)
Bilirubin Total: 0.2 mg/dL (ref 0.0–1.2)
CALCIUM: 9.6 mg/dL (ref 8.7–10.2)
CO2: 26 mmol/L (ref 18–29)
CREATININE: 0.61 mg/dL (ref 0.57–1.00)
Chloride: 103 mmol/L (ref 96–106)
GFR, EST AFRICAN AMERICAN: 148 mL/min/{1.73_m2} (ref >59)
GFR, EST NON AFRICAN AMERICAN: 128 mL/min/{1.73_m2} (ref >59)
GLOBULIN, TOTAL: 2.5 g/dL (ref 1.5–4.5)
Glucose: 100 mg/dL — ABNORMAL HIGH (ref 65–99)
POTASSIUM: 4.9 mmol/L (ref 3.5–5.2)
SODIUM: 144 mmol/L (ref 134–144)
Total Protein: 7.1 g/dL (ref 6.0–8.5)

## 2015-12-27 ENCOUNTER — Other Ambulatory Visit: Payer: Self-pay | Admitting: Physician Assistant

## 2015-12-27 DIAGNOSIS — F4310 Post-traumatic stress disorder, unspecified: Secondary | ICD-10-CM

## 2015-12-27 DIAGNOSIS — F411 Generalized anxiety disorder: Secondary | ICD-10-CM

## 2015-12-28 ENCOUNTER — Telehealth: Payer: Self-pay

## 2015-12-28 NOTE — Telephone Encounter (Signed)
-----   Message from Melvyn Novasarmen Dohmeier, MD sent at 12/28/2015 12:43 PM EDT ----- Normal CMET, non fast glucose.

## 2015-12-28 NOTE — Telephone Encounter (Signed)
I spoke to patient and she is aware of results.  

## 2015-12-30 ENCOUNTER — Ambulatory Visit (INDEPENDENT_AMBULATORY_CARE_PROVIDER_SITE_OTHER): Payer: BC Managed Care – PPO | Admitting: Physician Assistant

## 2015-12-30 VITALS — BP 130/74 | HR 82 | Temp 98.2°F | Resp 16 | Ht 69.0 in | Wt 183.2 lb

## 2015-12-30 DIAGNOSIS — F4310 Post-traumatic stress disorder, unspecified: Secondary | ICD-10-CM

## 2015-12-30 DIAGNOSIS — F419 Anxiety disorder, unspecified: Secondary | ICD-10-CM

## 2015-12-30 DIAGNOSIS — F509 Eating disorder, unspecified: Secondary | ICD-10-CM | POA: Diagnosis not present

## 2015-12-30 DIAGNOSIS — Z23 Encounter for immunization: Secondary | ICD-10-CM | POA: Diagnosis not present

## 2015-12-30 DIAGNOSIS — G47411 Narcolepsy with cataplexy: Secondary | ICD-10-CM

## 2015-12-30 DIAGNOSIS — F411 Generalized anxiety disorder: Secondary | ICD-10-CM

## 2015-12-30 MED ORDER — BUSPIRONE HCL 15 MG PO TABS: 15.0000 mg | ORAL_TABLET | Freq: Three times a day (TID) | ORAL | 0 refills | Status: DC

## 2015-12-30 NOTE — Patient Instructions (Signed)
   IF you received an x-ray today, you will receive an invoice from [facility]. Please contact [facility] at [phone] with questions or concerns regarding your invoice.   IF you received labwork today, you will receive an invoice from [facility]. Please contact [facility] at [phone] with questions or concerns regarding your invoice.   Our billing staff will not be able to assist you with questions regarding bills from these companies.  You will be contacted with the lab results as soon as they are available. The fastest way to get your results is to activate your My Chart account. Instructions are located on the last page of this paperwork. If you have not heard from us regarding the results in 2 weeks, please contact this office.    Influenza (Flu) Vaccine (Inactivated or Recombinant):  1. Why get vaccinated? Influenza ("flu") is a contagious disease that spreads around the [place] every year, usually between October and May. Flu is caused by influenza viruses, and is spread mainly by coughing, sneezing, and close contact. Anyone can get flu. Flu strikes suddenly and can last several days. Symptoms vary by age, but can include:  fever/chills  sore throat  muscle aches  fatigue  cough  headache  runny or stuffy nose Flu can also lead to pneumonia and blood infections, and cause diarrhea and seizures in children. If you have a medical condition, such as heart or lung disease, flu can make it worse. Flu is more dangerous for some people. Infants and young children, people 65 years of age and older, pregnant women, and people with certain health conditions or a weakened immune system are at greatest risk. Each year thousands of people in the [place] die from flu, and many more are hospitalized. Flu vaccine can:  keep you from getting flu,  make flu less severe if you do get it, and  keep you from spreading flu to  your family and other people. 2. Inactivated and recombinant flu vaccines A dose of flu vaccine is recommended every flu season. Children 6 months through 8 years of age may need two doses during the same flu season. Everyone else needs only one dose each flu season. Some inactivated flu vaccines contain a very small amount of a mercury-based preservative called thimerosal. Studies have not shown thimerosal in vaccines to be harmful, but flu vaccines that do not contain thimerosal are available. There is no live flu virus in flu shots. They cannot cause the flu. There are many flu viruses, and they are always changing. Each year a new flu vaccine is made to protect against three or four viruses that are likely to cause disease in the upcoming flu season. But even when the vaccine doesn't exactly match these viruses, it may still provide some protection. Flu vaccine cannot prevent:  flu that is caused by a virus not covered by the vaccine, or  illnesses that look like flu but are not. It takes about 2 weeks for protection to develop after vaccination, and protection lasts through the flu season. 3. Some people should not get this vaccine Tell the person who is giving you the vaccine:  If you have any severe, life-threatening allergies. If you ever had a life-threatening allergic reaction after a dose of flu vaccine, or have a severe allergy to any part of this vaccine, you may be advised not to get vaccinated. Most, but not all, types of flu vaccine contain a small amount of egg protein.    If you ever had Guillain-Barre Syndrome (also called GBS). Some people with a history of GBS should not get this vaccine. This should be discussed with your doctor.  If you are not feeling well. It is usually okay to get flu vaccine when you have a mild illness, but you might be asked to come back when you feel better. 4. Risks of a vaccine reaction With any medicine, including vaccines, there is a chance of  reactions. These are usually mild and go away on their own, but serious reactions are also possible. Most people who get a flu shot do not have any problems with it. Minor problems following a flu shot include:  soreness, redness, or swelling where the shot was given  hoarseness  sore, red or itchy eyes  cough  fever  aches  headache  itching  fatigue If these problems occur, they usually begin soon after the shot and last 1 or 2 days. More serious problems following a flu shot can include the following:  There may be a small increased risk of Guillain-Barre Syndrome (GBS) after inactivated flu vaccine. This risk has been estimated at 1 or 2 additional cases per million people vaccinated. This is much lower than the risk of severe complications from flu, which can be prevented by flu vaccine.  Young children who get the flu shot along with pneumococcal vaccine (PCV13) and/or DTaP vaccine at the same time might be slightly more likely to have a seizure caused by fever. Ask your doctor for more information. Tell your doctor if a child who is getting flu vaccine has ever had a seizure. Problems that could happen after any injected vaccine:  People sometimes faint after a medical procedure, including vaccination. Sitting or lying down for about 15 minutes can help prevent fainting, and injuries caused by a fall. Tell your doctor if you feel dizzy, or have vision changes or ringing in the ears.  Some people get severe pain in the shoulder and have difficulty moving the arm where a shot was given. This happens very rarely.  Any medication can cause a severe allergic reaction. Such reactions from a vaccine are very rare, estimated at about 1 in a million doses, and would happen within a few minutes to a few hours after the vaccination. As with any medicine, there is a very remote chance of a vaccine causing a serious injury or death. The safety of vaccines is always being monitored. For  more information, visit: www.cdc.gov/vaccinesafety/ 5. What if there is a serious reaction? What should I look for?  Look for anything that concerns you, such as signs of a severe allergic reaction, very high fever, or unusual behavior. Signs of a severe allergic reaction can include hives, swelling of the face and throat, difficulty breathing, a fast heartbeat, dizziness, and weakness. These would start a few minutes to a few hours after the vaccination. What should I do?  If you think it is a severe allergic reaction or other emergency that can't wait, call 9-1-1 and get the person to the nearest hospital. Otherwise, call your doctor.  Reactions should be reported to the Vaccine Adverse Event Reporting System (VAERS). Your doctor should file this report, or you can do it yourself through the VAERS web site at www.vaers.hhs.gov, or by calling 1-800-822-7967. VAERS does not give medical advice. 6. The National Vaccine Injury Compensation Program The National Vaccine Injury Compensation Program (VICP) is a federal program that was created to compensate people who may have been   injured by certain vaccines. Persons who believe they may have been injured by a vaccine can learn about the program and about filing a claim by calling 1-800-338-2382 or visiting the VICP website at www.hrsa.gov/vaccinecompensation. There is a time limit to file a claim for compensation. 7. How can I learn more?  Ask your healthcare provider. He or she can give you the vaccine package insert or suggest other sources of information.  Call your local or state health department.  Contact the Centers for Disease Control and Prevention (CDC):  Call 1-800-232-4636 (1-800-CDC-INFO) or  Visit CDC's website at www.cdc.gov/flu Vaccine Information Statement Inactivated Influenza Vaccine (11/07/2013)   This information is not intended to replace advice given to you by your health care provider. Make sure you discuss any  questions you have with your health care provider.   Document Released: 01/12/2006 Document Revised: 04/10/2014 Document Reviewed: 11/10/2013 Elsevier Interactive Patient Education 2016 Elsevier Inc.  
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
## 2015-12-31 ENCOUNTER — Telehealth: Payer: Self-pay

## 2015-12-31 NOTE — Telephone Encounter (Signed)
Tried to call pt but no answ/no VM. The only medication that was Rxd at 9/28 OV was Buspar, but the OV notes aren't completed yet and don't know how Sarah instr'd pt at OV. Maralyn SagoSarah, I'll forward to you in case you see this while you are out of the office. Otherwise, if pt calls back, please get specific details on what she thinks Maralyn SagoSarah instr'd her to do.

## 2015-12-31 NOTE — Telephone Encounter (Signed)
Patient stated she was seen by Sarah Estes on 12/30/15. Patient stated instructions are different on the medication then what she was instructed on how to take it in the office. Patient need to know how to take medication.  [phone].

## 2016-01-01 NOTE — Telephone Encounter (Signed)
Pt should start with Buspar 7.5mg  bid for 5 days and then increase to 15mg  bid

## 2016-01-03 NOTE — Telephone Encounter (Signed)
Left message with Medication instructions for buspar as directed by Maralyn SagoSarah and told to call us back if any further questions.Clarene CritchleyKaren M Terina Mcelhinny

## 2016-01-31 ENCOUNTER — Other Ambulatory Visit: Payer: Self-pay | Admitting: Physician Assistant

## 2016-01-31 ENCOUNTER — Encounter: Payer: Self-pay | Admitting: Physician Assistant

## 2016-01-31 DIAGNOSIS — F4310 Post-traumatic stress disorder, unspecified: Secondary | ICD-10-CM

## 2016-01-31 DIAGNOSIS — F419 Anxiety disorder, unspecified: Secondary | ICD-10-CM

## 2016-01-31 DIAGNOSIS — F411 Generalized anxiety disorder: Secondary | ICD-10-CM

## 2016-02-02 NOTE — Telephone Encounter (Signed)
Sarah, I got pt a refill of both meds, changed the buspar to TID dosing as you advised. Told pt to check in with you before the mos is out.

## 2016-03-01 ENCOUNTER — Other Ambulatory Visit: Payer: Self-pay | Admitting: Physician Assistant

## 2016-03-01 DIAGNOSIS — F4310 Post-traumatic stress disorder, unspecified: Secondary | ICD-10-CM

## 2016-03-01 DIAGNOSIS — F411 Generalized anxiety disorder: Secondary | ICD-10-CM

## 2016-03-21 ENCOUNTER — Telehealth: Payer: Self-pay

## 2016-03-21 ENCOUNTER — Ambulatory Visit: Payer: BC Managed Care – PPO | Admitting: Neurology

## 2016-03-21 NOTE — Telephone Encounter (Signed)
Patient did not show to appt today  

## 2016-03-22 ENCOUNTER — Encounter: Payer: Self-pay | Admitting: Neurology

## 2016-08-03 ENCOUNTER — Encounter: Payer: Self-pay | Admitting: Family Medicine

## 2016-08-03 ENCOUNTER — Ambulatory Visit: Payer: BC Managed Care – PPO | Admitting: Physician Assistant

## 2016-08-03 ENCOUNTER — Ambulatory Visit (INDEPENDENT_AMBULATORY_CARE_PROVIDER_SITE_OTHER): Payer: BC Managed Care – PPO | Admitting: Physician Assistant

## 2016-08-03 ENCOUNTER — Telehealth: Payer: Self-pay | Admitting: Physician Assistant

## 2016-08-03 DIAGNOSIS — F988 Other specified behavioral and emotional disorders with onset usually occurring in childhood and adolescence: Secondary | ICD-10-CM | POA: Diagnosis not present

## 2016-08-03 DIAGNOSIS — F411 Generalized anxiety disorder: Secondary | ICD-10-CM

## 2016-08-03 DIAGNOSIS — F419 Anxiety disorder, unspecified: Secondary | ICD-10-CM | POA: Diagnosis not present

## 2016-08-03 DIAGNOSIS — F4310 Post-traumatic stress disorder, unspecified: Secondary | ICD-10-CM

## 2016-08-03 MED ORDER — BUSPIRONE HCL 15 MG PO TABS: 15.0000 mg | ORAL_TABLET | Freq: Two times a day (BID) | ORAL | 1 refills | Status: DC

## 2016-08-03 MED ORDER — ESCITALOPRAM OXALATE 20 MG PO TABS: 20.0000 mg | ORAL_TABLET | Freq: Every day | ORAL | 0 refills | Status: DC

## 2016-08-03 NOTE — Patient Instructions (Addendum)
Start buspar 1/2 pill 2x/day for 10 days then increase to 1 pill bid  For lexpaor stat with 1/2 pill for 14 days and then increase to 1 full pill for 14d and then increase to 1.5 pills until you see me    IF you received an x-ray today, you will [facility] an [facility] from [facility]. Please contact [facility] at [phone] with questions or concerns regarding your invoice.   IF you received labwork today, you will receive an invoice from [facility]. Please contact [facility] at [phone] with questions or concerns regarding your invoice.   Our billing staff will not be able to assist you with questions regarding bills from these companies.  You will be contacted with the lab results as soon as they are available. The fastest way to get your results is to activate your My Chart account. Instructions are located on the last page of this paperwork. If you have not heard from us regarding the results in 2 weeks, please contact this office.

## 2016-08-03 NOTE — Telephone Encounter (Signed)
Letters for school and work printed and in rx pick up box at 102.

## 2016-08-03 NOTE — Progress Notes (Deleted)
   Sarah FrameStephanie Estes is a 26 y.o. female who presents to Primary Care at [facility] today for ***  1.  ***  ROS as above.  Pertinently, no chest pain, palpitations, SOB, Fever, Chills, Abd pain, N/V/D.   PMH reviewed. Patient is a nonsmoker.   Past Medical History:  Diagnosis Date  . Adult ADHD   . Allergy   . Anorexia   . Anxiety   . Anxiety and depression   . Asthma   . Depression   . Endometriosis   . H/O gastric bypass   . Narcolepsy and cataplexy   . Pseudotumor cerebri 2011   Past Surgical History:  Procedure Laterality Date  . COLONOSCOPY    . COSMETIC SURGERY    . GASTRIC BYPASS    . LAPAROSCOPIC ENDOMETRIOSIS FULGURATION    . LUMBAR PUNCTURE      Medications reviewed. Current Outpatient Prescriptions  Medication Sig Dispense Refill  . levonorgestrel (MIRENA) 20 MCG/24HR IUD 1 each by Intrauterine route continuous.    . modafinil (PROVIGIL) 200 MG tablet 1 tablet in the morning, 1 at noon 60 tablet 5  . Sodium Oxybate (XYREM) 500 MG/ML SOLN 4.500 mg twice at night. 270 mL 1  . amphetamine-dextroamphetamine (ADDERALL) 10 MG tablet May take 1-2 tablets daily. (Patient not taking: Reported on 08/03/2016) 60 tablet 0  . busPIRone (BUSPAR) 15 MG tablet TAKE 1 TABLET(15 MG) BY MOUTH THREE TIMES DAILY (Patient not taking: Reported on 08/03/2016) 90 tablet 0  . escitalopram (LEXAPRO) 20 MG tablet TAKE 1 1/2 TABLET BY MOUTH EVERY DAY (Patient not taking: Reported on 08/03/2016) 45 tablet 0   No current facility-administered medications for this visit.      Physical Exam:  BP 128/81   Pulse 87   Temp 98.5 F (36.9 C) (Oral)   Resp 16   Ht 5\' 9"  (1.753 m)   Wt 170 lb 9.6 oz (77.4 kg)   SpO2 100%   BMI 25.19 kg/m  Gen:  Alert, cooperative patient who appears stated age in no acute distress.  Vital signs reviewed. HEENT: EOMI,  MMM Pulm:  Clear to auscultation bilaterally with good air movement.  No wheezes or rales noted.   Cardiac:  Regular rate and rhythm without murmur  auscultated.  Good S1/S2. Abd:  Soft/nondistended/nontender.  Good bowel sounds throughout all four quadrants.  No masses noted.  Exts: Non edematous BL  LE, warm and well perfused.   Assessment and Plan:  1.  ***
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
## 2016-08-03 NOTE — Assessment & Plan Note (Signed)
No more adderall as she has had problems with addiction, drug abuse and misuse

## 2016-08-03 NOTE — Assessment & Plan Note (Signed)
Restart medication -- Lexapro - titrate up from 10mg  to 30 mg over the next month ---- buspar titrate up to 15mg  tid from 7.5mg  bid over the next 2-3 weeks

## 2016-08-03 NOTE — Assessment & Plan Note (Signed)
Restart medication

## 2016-08-21 ENCOUNTER — Telehealth: Payer: Self-pay | Admitting: Physician Assistant

## 2016-08-21 NOTE — Telephone Encounter (Signed)
Pt mother states that she need her daughters note to say that she need to be out of school from May 16th -May 22nd return on  May 23rd

## 2016-08-21 NOTE — Telephone Encounter (Signed)
Will you write?

## 2016-08-21 NOTE — Telephone Encounter (Signed)
PATIENT'S MOTHER (MICHELLE) STATES HER DAUGHTER SAW SARAH ABOUT 2 WEEKS AGO FOR ANXIETY. SHE NEEDS SARAH TO WRITE HER A NEW SCHOOL NOTE. SHE WAS OUT ON WED. 5/16, THURS. 5/17, FRI. 5/18, AND MON. 08/21/2016. SHE WILL RETURN ON TUES. 08/22/2016. THIS WAS DUE TO HER ANXIETY. 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 MBC

## 2016-08-21 NOTE — Telephone Encounter (Signed)
Mom advised and will touch base with daughter. She asked if daughter came in now if shed write, I advised I did not know and re iterated that multiple times

## 2016-08-21 NOTE — Telephone Encounter (Signed)
Please call the patient - I did not see her during that time therefore I cannot write her out of school note.

## 2016-08-22 ENCOUNTER — Ambulatory Visit (INDEPENDENT_AMBULATORY_CARE_PROVIDER_SITE_OTHER): Payer: BC Managed Care – PPO | Admitting: Physician Assistant

## 2016-08-22 ENCOUNTER — Telehealth: Payer: Self-pay | Admitting: Family Medicine

## 2016-08-22 ENCOUNTER — Encounter: Payer: Self-pay | Admitting: Physician Assistant

## 2016-08-22 DIAGNOSIS — F411 Generalized anxiety disorder: Secondary | ICD-10-CM

## 2016-08-22 DIAGNOSIS — F988 Other specified behavioral and emotional disorders with onset usually occurring in childhood and adolescence: Secondary | ICD-10-CM | POA: Diagnosis not present

## 2016-08-22 DIAGNOSIS — F1911 Other psychoactive substance abuse, in remission: Secondary | ICD-10-CM | POA: Diagnosis not present

## 2016-08-22 DIAGNOSIS — F4310 Post-traumatic stress disorder, unspecified: Secondary | ICD-10-CM | POA: Diagnosis not present

## 2016-08-22 DIAGNOSIS — F5000 Anorexia nervosa, unspecified: Secondary | ICD-10-CM

## 2016-08-22 DIAGNOSIS — F419 Anxiety disorder, unspecified: Secondary | ICD-10-CM

## 2016-08-22 DIAGNOSIS — F514 Sleep terrors [night terrors]: Secondary | ICD-10-CM | POA: Diagnosis not present

## 2016-08-22 MED ORDER — ESCITALOPRAM OXALATE 20 MG PO TABS: 30.0000 mg | ORAL_TABLET | Freq: Every day | ORAL | 0 refills | Status: DC

## 2016-08-22 MED ORDER — BUSPIRONE HCL 15 MG PO TABS: 15.0000 mg | ORAL_TABLET | Freq: Three times a day (TID) | ORAL | 0 refills | Status: DC
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
## 2016-08-22 NOTE — Telephone Encounter (Signed)
Sarah Estes Pt calling needing a note for school to be out until Thursday return on Friday

## 2016-08-22 NOTE — Patient Instructions (Addendum)
Increase your buspar - add a middle of the day dose - start with 5mg  for 2 days then increase to 10mg  for 2 days and then 15mg      IF you received an x-ray today, you will receive an invoice from [facility]. Please contact [facility] at [phone] with questions or concerns regarding your invoice.   IF you received labwork today, you will receive an invoice from [facility]. Please contact [facility] at [phone] with questions or concerns regarding your invoice.   Our billing staff will not be able to assist you with questions regarding bills from these companies.  You will be contacted with the lab results as soon as they are available. The fastest way to get your results is to activate your My Chart account. Instructions are located on the last page of this paperwork. If you have not heard from us regarding the results in 2 weeks, please contact this office.

## 2016-08-23 NOTE — Telephone Encounter (Signed)
IS THIS OK TO WRITE?

## 2016-08-23 NOTE — Telephone Encounter (Signed)
Yes ok to write

## 2016-08-23 NOTE — Telephone Encounter (Signed)
Letter up front l/m

## 2016-09-05 ENCOUNTER — Telehealth: Payer: Self-pay | Admitting: Family Medicine

## 2016-09-05 ENCOUNTER — Encounter: Payer: Self-pay | Admitting: Family Medicine

## 2016-09-05 ENCOUNTER — Ambulatory Visit (INDEPENDENT_AMBULATORY_CARE_PROVIDER_SITE_OTHER): Payer: BC Managed Care – PPO | Admitting: Family Medicine

## 2016-09-05 VITALS — BP 111/70 | HR 81 | Temp 98.2°F | Resp 17 | Ht 69.0 in | Wt 176.0 lb

## 2016-09-05 DIAGNOSIS — Z975 Presence of (intrauterine) contraceptive device: Secondary | ICD-10-CM

## 2016-09-05 DIAGNOSIS — Z538 Procedure and treatment not carried out for other reasons: Secondary | ICD-10-CM | POA: Diagnosis not present

## 2016-09-05 NOTE — Progress Notes (Signed)
Chief Complaint  Patient presents with  . wants IUD removed    HPI  Pt has a IUD Mirena that has been in for 4 years This is her second mirena She feels like it shifted and reports that she has been having pelvic pain She states that she would like to go on OCP  No discharge No stds No migraines Has a history of pseudotumor cerebri No liver disease  No cancer Does not smoke cigarettes  Past Medical History:  Diagnosis Date  . Adult ADHD   . Allergy   . Anorexia   . Anxiety   . Anxiety and depression   . Asthma   . Depression   . Endometriosis   . H/O gastric bypass   . Narcolepsy and cataplexy   . Pseudotumor cerebri 2011    Current Outpatient Prescriptions  Medication Sig Dispense Refill  . busPIRone (BUSPAR) 15 MG tablet Take 1 tablet (15 mg total) by mouth 3 (three) times daily. 90 tablet 0  . escitalopram (LEXAPRO) 20 MG tablet Take 1.5 tablets (30 mg total) by mouth daily. 45 tablet 0  . modafinil (PROVIGIL) 200 MG tablet 1 tablet in the morning, 1 at noon 60 tablet 5  . Sodium Oxybate (XYREM) 500 MG/ML SOLN 4.500 mg twice at night. 270 mL 1   No current facility-administered medications for this visit.     Allergies:  Allergies  Allergen Reactions  . Corticosteroids Other (See Comments)    Injections cannot take due to gastric surgery (creams or tablets?)  . Gluten Meal Nausea Only and Other (See Comments)    Exacerbates endometriosis symptoms    . Nsaids Other (See Comments)    Had gastric bypass surgery   . Sulfa Antibiotics Hives, Itching and Rash  . Morphine And Related Itching    Past Surgical History:  Procedure Laterality Date  . COLONOSCOPY    . COSMETIC SURGERY    . GASTRIC BYPASS    . LAPAROSCOPIC ENDOMETRIOSIS FULGURATION    . LUMBAR PUNCTURE      Social History   Social History  . Marital status: Single    Spouse name: n/a  . Number of children: 0  . Years of education: 14+   Occupational History  . pharmacy tech [employer]    also at [employer]   Social History Main Topics  . Smoking status: Never Smoker  . Smokeless tobacco: Never Used  . Alcohol use 0.0 oz/week     Comment: Socially  . Drug use: No  . Sexual activity: Yes    Partners: Male    Birth control/ protection: IUD   Other Topics Concern  . None   Social History Narrative   Pt lives at home with her mom, dad, and two brothers.   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  Diagnoses and all orders for this visit:  Attempted IUD removal, unsuccessful  follow up with Gynecology Discussed that she might need ultrasound for verification as well as cervical dilation She will call her Gynecologist for appointment.   Zoe A Stallings

## 2016-09-05 NOTE — Patient Instructions (Signed)
     IF you received an x-ray today, you will receive an invoice from [facility]. Please contact [facility] at [phone] with questions or concerns regarding your invoice.   IF you received labwork today, you will receive an invoice from [facility]. Please contact [facility] at [phone] with questions or concerns regarding your invoice.   Our billing staff will not be able to assist you with questions regarding bills from these companies.  You will be contacted with the lab results as soon as they are available. The fastest way to get your results is to activate your My Chart account. Instructions are located on the last page of this paperwork. If you have not heard from us regarding the results in 2 weeks, please contact this office.     

## 2016-09-05 NOTE — Telephone Encounter (Signed)
Patient called in stating she went to GYN to get her IUD removed since Dr Creta LevinStallings could not find it in her OV on 09/05/16 states she would like to get the medication they talked about called into the [facility] [phone] - [place], [place] - [address] [facility] [address], she was not sure of the name of the medication.

## 2016-09-06 MED ORDER — NORETHIN-ETH ESTRADIOL-FE 0.8-25 MG-MCG PO CHEW: 1.0000 | CHEWABLE_TABLET | Freq: Every day | ORAL | 11 refills | Status: DC

## 2016-09-06 NOTE — Telephone Encounter (Signed)
Notified pt to use back up contraception because of the provigil

## 2016-09-26 ENCOUNTER — Ambulatory Visit (INDEPENDENT_AMBULATORY_CARE_PROVIDER_SITE_OTHER): Payer: BC Managed Care – PPO | Admitting: Physician Assistant

## 2016-09-26 ENCOUNTER — Encounter: Payer: Self-pay | Admitting: Physician Assistant

## 2016-09-26 DIAGNOSIS — F4310 Post-traumatic stress disorder, unspecified: Secondary | ICD-10-CM | POA: Diagnosis not present

## 2016-09-26 DIAGNOSIS — F419 Anxiety disorder, unspecified: Secondary | ICD-10-CM | POA: Diagnosis not present

## 2016-09-26 DIAGNOSIS — F411 Generalized anxiety disorder: Secondary | ICD-10-CM

## 2016-09-26 MED ORDER — ESCITALOPRAM OXALATE 20 MG PO TABS: 30.0000 mg | ORAL_TABLET | Freq: Every day | ORAL | 0 refills | Status: DC

## 2016-09-26 MED ORDER — BUSPIRONE HCL 10 MG PO TABS: 20.0000 mg | ORAL_TABLET | Freq: Three times a day (TID) | ORAL | 0 refills | Status: DC

## 2016-09-26 NOTE — Patient Instructions (Addendum)
   IF you received an x-ray today, you will receive an invoice from [facility]. Please contact [facility] at [phone] with questions or concerns regarding your invoice.   IF you received labwork today, you will receive an invoice from [facility]. Please contact [facility] at [phone] with questions or concerns regarding your invoice.   Our billing staff will not be able to assist you with questions regarding bills from these companies.  You will be contacted with the lab results as soon as they are available. The fastest way to get your results is to activate your My Chart account. Instructions are located on the last page of this paperwork. If you have not heard from us regarding the results in 2 weeks, please contact this office.    Etonogestrel implant What is this medicine? ETONOGESTREL (et oh noe JES trel) is a contraceptive (birth control) device. It is used to prevent pregnancy. It can be used for up to 3 years. This medicine may be used for other purposes; ask your health care provider or pharmacist if you have questions. COMMON BRAND NAME(S): Implanon, Nexplanon What should I tell my health care provider before I take this medicine? They need to know if you have any of these conditions: -abnormal vaginal bleeding -blood vessel disease or blood clots -cancer of the breast, cervix, or liver -depression -diabetes -gallbladder disease -headaches -heart disease or recent heart attack -high blood pressure -high cholesterol -kidney disease -liver disease -renal disease -seizures -tobacco smoker -an unusual or allergic reaction to etonogestrel, other hormones, anesthetics or antiseptics, medicines, foods, dyes, or preservatives -pregnant or trying to get pregnant -breast-feeding How should I use this medicine? This device is inserted just under the skin on the inner side of your upper arm by a health care professional. Talk to your pediatrician  regarding the use of this medicine in children. Special care may be needed. Overdosage: If you think you have taken too much of this medicine contact a poison control center or emergency room at once. NOTE: This medicine is only for you. Do not share this medicine with others. What if I miss a dose? This does not apply. What may interact with this medicine? Do not take this medicine with any of the following medications: -amprenavir -bosentan -fosamprenavir This medicine may also interact with the following medications: -barbiturate medicines for inducing sleep or treating seizures -certain medicines for fungal infections like ketoconazole and itraconazole -grapefruit juice -griseofulvin -medicines to treat seizures like carbamazepine, felbamate, oxcarbazepine, phenytoin, topiramate -modafinil -phenylbutazone -rifampin -rufinamide -some medicines to treat HIV infection like atazanavir, indinavir, lopinavir, nelfinavir, tipranavir, ritonavir -St. John's wort This list may not describe all possible interactions. Give your health care provider a list of all the medicines, herbs, non-prescription drugs, or dietary supplements you use. Also tell them if you smoke, drink alcohol, or use illegal drugs. Some items may interact with your medicine. What should I watch for while using this medicine? This product does not protect you against HIV infection (AIDS) or other sexually transmitted diseases. You should be able to feel the implant by pressing your fingertips over the skin where it was inserted. Contact your doctor if you cannot feel the implant, and use a non-hormonal birth control method (such as condoms) until your doctor confirms that the implant is in place. If you feel that the implant may have broken or become bent while in your arm, contact your healthcare provider. What side effects may I notice from receiving this medicine? Side effects   effects that you should report to your doctor or health  care professional as soon as possible: -allergic reactions like skin rash, itching or hives, swelling of the face, lips, or tongue -breast lumps -changes in emotions or moods -depressed mood -heavy or prolonged menstrual bleeding -pain, irritation, swelling, or bruising at the insertion site -scar at site of insertion -signs of infection at the insertion site such as fever, and skin redness, pain or discharge -signs of pregnancy -signs and symptoms of a blood clot such as breathing problems; changes in vision; chest pain; severe, sudden headache; pain, swelling, warmth in the leg; trouble speaking; sudden numbness or weakness of the face, arm or leg -signs and symptoms of liver injury like dark yellow or brown urine; general ill feeling or flu-like symptoms; light-colored stools; loss of appetite; nausea; right upper belly pain; unusually weak or tired; yellowing of the eyes or skin -unusual vaginal bleeding, discharge -signs and symptoms of a stroke like changes in vision; confusion; trouble speaking or understanding; severe headaches; sudden numbness or weakness of the face, arm or leg; trouble walking; dizziness; loss of balance or coordination Side effects that usually do not require medical attention (report to your doctor or health care professional if they continue or are bothersome): -acne -back pain -breast pain -changes in weight -dizziness -general ill feeling or flu-like symptoms -headache -irregular menstrual bleeding -nausea -sore throat -vaginal irritation or inflammation This list may not describe all possible side effects. Call your doctor for medical advice about side effects. You may report side effects to FDA at 1-800-FDA-1088. Where should I keep my medicine? This drug is given in a hospital or clinic and will not be stored at home. NOTE: This sheet is a summary. It may not cover all possible information. If you have questions about this medicine, talk to your  doctor, pharmacist, or health care provider.  2018 Elsevier/Gold Standard (2015-10-07 11:19:22)
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
## 2016-10-02 ENCOUNTER — Encounter: Payer: Self-pay | Admitting: Neurology

## 2016-10-02 ENCOUNTER — Ambulatory Visit (INDEPENDENT_AMBULATORY_CARE_PROVIDER_SITE_OTHER): Payer: BC Managed Care – PPO | Admitting: Neurology

## 2016-10-02 VITALS — BP 123/77 | HR 96 | Ht 69.0 in | Wt 184.5 lb

## 2016-10-02 DIAGNOSIS — R569 Unspecified convulsions: Secondary | ICD-10-CM

## 2016-10-02 DIAGNOSIS — Z79899 Other long term (current) drug therapy: Secondary | ICD-10-CM | POA: Diagnosis not present

## 2016-10-02 DIAGNOSIS — G4753 Recurrent isolated sleep paralysis: Secondary | ICD-10-CM

## 2016-10-02 DIAGNOSIS — F445 Conversion disorder with seizures or convulsions: Secondary | ICD-10-CM | POA: Diagnosis not present

## 2016-10-02 DIAGNOSIS — G932 Benign intracranial hypertension: Secondary | ICD-10-CM | POA: Diagnosis not present

## 2016-10-02 DIAGNOSIS — G47411 Narcolepsy with cataplexy: Secondary | ICD-10-CM | POA: Diagnosis not present

## 2016-10-02 DIAGNOSIS — G4719 Other hypersomnia: Secondary | ICD-10-CM | POA: Diagnosis not present

## 2016-10-02 MED ORDER — SODIUM OXYBATE 500 MG/ML PO SOLN: ORAL | 1 refills | Status: DC

## 2016-10-02 MED ORDER — MODAFINIL 200 MG PO TABS: ORAL_TABLET | ORAL | 5 refills | Status: DC
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
## 2016-10-02 NOTE — Patient Instructions (Signed)
As discussed, Xyrem has to be taken with very mindful caution: Taking Xyrem correctly is key. This means, take it only when you are fully ready to fall asleep, while in bed and refrain from doing any other activities, even brushing  your teeth after taking your first dose. The second dose will be about 2-1/2-4 hours after his first dose. You can go to the bathroom before your 2nd dose. Take your first dose, when actually IN BED, ready to sleep. No sitting up in bed, NO reading, NO using the cell phone or computer, NO getting up to use the bathroom. Take care of everything BEFORE sleep time. Try NOT to skip the second dose as the Xyrem is not going to stay in your system long enough with only one dose. Do not drink alcohol with Xyrem. If you do drink Alcohol, you cannot take your Xyrem doses that night.   

## 2016-10-03 LAB — COMPREHENSIVE METABOLIC PANEL
ALBUMIN: 3.9 g/dL (ref 3.5–5.5)
ALK PHOS: 70 IU/L (ref 39–117)
ALT: 13 IU/L (ref 0–32)
AST: 17 IU/L (ref 0–40)
Albumin/Globulin Ratio: 1.6 (ref 1.2–2.2)
BUN / CREAT RATIO: 25 — AB (ref 9–23)
BUN: 17 mg/dL (ref 6–20)
Bilirubin Total: 0.4 mg/dL (ref 0.0–1.2)
CO2: 23 mmol/L (ref 20–29)
CREATININE: 0.68 mg/dL (ref 0.57–1.00)
Calcium: 8.7 mg/dL (ref 8.7–10.2)
Chloride: 105 mmol/L (ref 96–106)
GFR calc non Af Amer: 123 mL/min/{1.73_m2} (ref >59)
GFR, EST AFRICAN AMERICAN: 142 mL/min/{1.73_m2} (ref >59)
GLOBULIN, TOTAL: 2.5 g/dL (ref 1.5–4.5)
Glucose: 63 mg/dL — ABNORMAL LOW (ref 65–99)
Potassium: 4.9 mmol/L (ref 3.5–5.2)
SODIUM: 140 mmol/L (ref 134–144)
TOTAL PROTEIN: 6.4 g/dL (ref 6.0–8.5)

## 2016-10-05 ENCOUNTER — Telehealth: Payer: Self-pay | Admitting: Neurology

## 2016-10-05 NOTE — Telephone Encounter (Signed)
-----   Message from Melvyn Novasarmen Dohmeier, MD sent at 10/03/2016  4:37 PM EDT ----- Normal results for a well hydrated patient. Continue Percell BostonXYREM

## 2016-10-05 NOTE — Telephone Encounter (Signed)
Called pt to notify her of lab results. Per pt DPR permission to leave a detailed message is allowed. Pt didn't answer. Left a message stating her lab results we normal and that the MD recommends to continue to take Xyrem. LVM for pt that if she has any further questions please call us back.

## 2016-10-10 ENCOUNTER — Telehealth: Payer: Self-pay | Admitting: Neurology

## 2016-10-10 NOTE — Telephone Encounter (Signed)
PA for Modafinil completed. Sent to [facility]

## 2016-10-11 ENCOUNTER — Telehealth: Payer: Self-pay | Admitting: Neurology

## 2016-10-11 NOTE — Telephone Encounter (Signed)
Per vo by Dr. Vickey Hugerohmeier, she is aware of this patient's past drug use history.  States she is now clean and she is comfortable prescribed Xyrem.  Returned call to pharmacy and spoke to April. She also stated the patient reported that she is now free from drugs.  She was given Dr. Oliva Bustardohmeier's orders to fill the medication and they will process the prescription for the patient.

## 2016-10-11 NOTE — Telephone Encounter (Signed)
Rosey Batheresa with [facility] called office in reference to patients Sodium Oxybate (XYREM) 500 MG/ML SOLN.  Pharmacy needs to be sure Dr. Vickey Hugerohmeier is aware of patients recreational drug history, and will need verbal okay before filling medication.  Please call

## 2016-10-17 ENCOUNTER — Telehealth: Payer: Self-pay | Admitting: Neurology

## 2016-10-17 NOTE — Telephone Encounter (Signed)
PA completed and sent for Xyrem. Awaiting response

## 2016-10-17 NOTE — Telephone Encounter (Signed)
Modafinil approved by [facility] from 10/10/16-10/10/17. # [id number]

## 2016-11-06 ENCOUNTER — Emergency Department (HOSPITAL_COMMUNITY)
Admission: EM | Admit: 2016-11-06 | Discharge: 2016-11-06 | Disposition: A | Discharge status: Home / Self Care | Payer: BC Managed Care – PPO | Attending: Emergency Medicine | Admitting: Emergency Medicine

## 2016-11-06 ENCOUNTER — Encounter (HOSPITAL_COMMUNITY): Payer: Self-pay

## 2016-11-06 DIAGNOSIS — T50995A Adverse effect of other drugs, medicaments and biological substances, initial encounter: Secondary | ICD-10-CM | POA: Diagnosis not present

## 2016-11-06 DIAGNOSIS — J45909 Unspecified asthma, uncomplicated: Secondary | ICD-10-CM | POA: Diagnosis not present

## 2016-11-06 DIAGNOSIS — F17200 Nicotine dependence, unspecified, uncomplicated: Secondary | ICD-10-CM | POA: Diagnosis not present

## 2016-11-06 DIAGNOSIS — G47411 Narcolepsy with cataplexy: Secondary | ICD-10-CM | POA: Diagnosis not present

## 2016-11-06 DIAGNOSIS — R1110 Vomiting, unspecified: Secondary | ICD-10-CM | POA: Insufficient documentation

## 2016-11-06 DIAGNOSIS — T50905A Adverse effect of unspecified drugs, medicaments and biological substances, initial encounter: Secondary | ICD-10-CM

## 2016-11-06 DIAGNOSIS — R4182 Altered mental status, unspecified: Secondary | ICD-10-CM | POA: Insufficient documentation

## 2016-11-06 DIAGNOSIS — Z3201 Encounter for pregnancy test, result positive: Secondary | ICD-10-CM | POA: Insufficient documentation

## 2016-11-06 DIAGNOSIS — Z3490 Encounter for supervision of normal pregnancy, unspecified, unspecified trimester: Secondary | ICD-10-CM

## 2016-11-06 DIAGNOSIS — Z79899 Other long term (current) drug therapy: Secondary | ICD-10-CM | POA: Diagnosis not present

## 2016-11-06 LAB — CBC
HCT: 35.2 % — ABNORMAL LOW (ref 36.0–46.0)
HEMOGLOBIN: 12.2 g/dL (ref 12.0–15.0)
MCH: 31 pg (ref 26.0–34.0)
MCHC: 34.7 g/dL (ref 30.0–36.0)
MCV: 89.6 fL (ref 78.0–100.0)
Platelets: 300 10*3/uL (ref 150–400)
RBC: 3.93 MIL/uL (ref 3.87–5.11)
RDW: 13.8 % (ref 11.5–15.5)
WBC: 5.8 10*3/uL (ref 4.0–10.5)

## 2016-11-06 LAB — COMPREHENSIVE METABOLIC PANEL
ALBUMIN: 3.6 g/dL (ref 3.5–5.0)
ALT: 12 U/L — AB (ref 14–54)
AST: 23 U/L (ref 15–41)
Alkaline Phosphatase: 60 U/L (ref 38–126)
Anion gap: 10 (ref 5–15)
BUN: 13 mg/dL (ref 6–20)
CHLORIDE: 107 mmol/L (ref 101–111)
CO2: 23 mmol/L (ref 22–32)
CREATININE: 0.68 mg/dL (ref 0.44–1.00)
Calcium: 8.3 mg/dL — ABNORMAL LOW (ref 8.9–10.3)
GFR calc Af Amer: >60 mL/min (ref >60)
GFR calc non Af Amer: >60 mL/min (ref >60)
GLUCOSE: 106 mg/dL — AB (ref 65–99)
POTASSIUM: 3.9 mmol/L (ref 3.5–5.1)
SODIUM: 140 mmol/L (ref 135–145)
Total Bilirubin: 0.4 mg/dL (ref 0.3–1.2)
Total Protein: 6.3 g/dL — ABNORMAL LOW (ref 6.5–8.1)

## 2016-11-06 LAB — RAPID URINE DRUG SCREEN, HOSP PERFORMED
AMPHETAMINES: NOT DETECTED
Barbiturates: NOT DETECTED
Benzodiazepines: NOT DETECTED
Cocaine: NOT DETECTED
Opiates: NOT DETECTED
Tetrahydrocannabinol: NOT DETECTED

## 2016-11-06 LAB — HCG, QUANTITATIVE, PREGNANCY: HCG, BETA CHAIN, QUANT, S: 852 m[IU]/mL — AB (ref <5)

## 2016-11-06 LAB — SALICYLATE LEVEL: Salicylate Lvl: <7 mg/dL (ref 2.8–30.0)

## 2016-11-06 LAB — ACETAMINOPHEN LEVEL: Acetaminophen (Tylenol), Serum: 23 ug/mL (ref 10–30)

## 2016-11-06 LAB — I-STAT CG4 LACTIC ACID, ED: Lactic Acid, Venous: 0.98 mmol/L (ref 0.5–1.9)

## 2016-11-06 LAB — I-STAT BETA HCG BLOOD, ED (MC, WL, AP ONLY): HCG, QUANTITATIVE: 961.2 m[IU]/mL — AB (ref <5)

## 2016-11-06 LAB — ETHANOL: Alcohol, Ethyl (B): <5 mg/dL (ref <5)

## 2016-11-06 LAB — CBG MONITORING, ED: GLUCOSE-CAPILLARY: 111 mg/dL — AB (ref 65–99)

## 2016-11-06 MED ORDER — SODIUM CHLORIDE 0.9 % IV BOLUS (SEPSIS)
1000.0000 mL | Freq: Once | INTRAVENOUS | Status: AC
  Administered 2016-11-06: 1000 mL via INTRAVENOUS
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
## 2016-11-06 NOTE — ED Notes (Signed)
Bed: RESA Expected date:  Expected time:  Means of arrival:  Comments: Unresponsive OD, EMS

## 2016-11-06 NOTE — ED Notes (Signed)
Pt was given a cup of water.  Urine sample obtained.
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
## 2016-11-06 NOTE — Discharge Instructions (Signed)
Start taking prenatal vitamin. Use tylenol only for pain. Ask OBGYN about any medications prior to taking them. Return to ER or [facility] if any vaginal bleeding, abdominal pain, passing out, or severe vomiting.

## 2016-11-06 NOTE — ED Notes (Signed)
Spoke with Alona BeneJoyce from poison control with overdose on Xyrem can see bradycardia, usually resolves in 2 hours, risk of seizures, watch airway possible intubation, fluids, benzos if she seizes. Reevaluate in 6-8 hours. May need to be watched over night.

## 2016-11-06 NOTE — ED Notes (Signed)
Pt ambulated with physician around the department.

## 2016-11-06 NOTE — ED Notes (Signed)
Patient denies pain and is resting comfortably.  

## 2016-11-16 NOTE — Telephone Encounter (Signed)
Received a new PA request Key for pt. Resubmitted PA for Xyrem. KEY: [id number].

## 2016-11-16 NOTE — Telephone Encounter (Signed)
PA for Xyrem approved 11/16/2016-11/16/2017. PA# [id number]

## 2016-11-17 ENCOUNTER — Telehealth: Payer: Self-pay | Admitting: Neurology

## 2016-11-17 NOTE — Telephone Encounter (Signed)
Pt called the clinic she found out she is pregnant 11/06/16 . OBGYN has stopped all meds but xyrem. She is wanting to discuss the risks if she continues taking it. At this time she chosen to stop it as of 11/06/16. Please call to discuss.

## 2016-11-17 NOTE — Telephone Encounter (Signed)
  Sarah Estes is considered safe to continue in pregnancy , yet I have not managed it before.  I will call the manufacturing firm, [employer] , I would recommend to wean off for the rest of the pregnancy and restart after delivery.

## 2016-11-22 NOTE — Telephone Encounter (Signed)
Called and LVM for pt relaying message below. Gave [facility] phone number if she has further questions or concerns.

## 2016-11-22 NOTE — Telephone Encounter (Signed)
Per Dr Vickey Huger- clarified that patient has already stopped medication. Per CD,MD that okay to stay off medication for the rest of her pregnancy and restart after delivery

## 2016-12-01 LAB — OB RESULTS CONSOLE RUBELLA ANTIBODY, IGM: Rubella: IMMUNE

## 2016-12-01 LAB — OB RESULTS CONSOLE HIV ANTIBODY (ROUTINE TESTING): HIV: NONREACTIVE

## 2016-12-01 LAB — OB RESULTS CONSOLE HEPATITIS B SURFACE ANTIGEN: Hepatitis B Surface Ag: NEGATIVE

## 2016-12-01 LAB — OB RESULTS CONSOLE RPR: RPR: NONREACTIVE

## 2016-12-18 LAB — OB RESULTS CONSOLE GC/CHLAMYDIA
CHLAMYDIA, DNA PROBE: NEGATIVE
Gonorrhea: NEGATIVE

## 2016-12-30 ENCOUNTER — Inpatient Hospital Stay (HOSPITAL_COMMUNITY)
Admission: AD | Admit: 2016-12-30 | Discharge: 2016-12-30 | Disposition: A | Discharge status: Home / Self Care | Payer: BC Managed Care – PPO | Source: Ambulatory Visit | Attending: Obstetrics and Gynecology | Admitting: Obstetrics and Gynecology

## 2016-12-30 ENCOUNTER — Encounter (HOSPITAL_COMMUNITY): Payer: Self-pay | Admitting: *Deleted

## 2016-12-30 DIAGNOSIS — Z79899 Other long term (current) drug therapy: Secondary | ICD-10-CM | POA: Diagnosis not present

## 2016-12-30 DIAGNOSIS — K625 Hemorrhage of anus and rectum: Secondary | ICD-10-CM | POA: Diagnosis present

## 2016-12-30 DIAGNOSIS — O9989 Other specified diseases and conditions complicating pregnancy, childbirth and the puerperium: Secondary | ICD-10-CM

## 2016-12-30 DIAGNOSIS — F909 Attention-deficit hyperactivity disorder, unspecified type: Secondary | ICD-10-CM | POA: Diagnosis not present

## 2016-12-30 DIAGNOSIS — O99341 Other mental disorders complicating pregnancy, first trimester: Secondary | ICD-10-CM | POA: Insufficient documentation

## 2016-12-30 DIAGNOSIS — Z87891 Personal history of nicotine dependence: Secondary | ICD-10-CM | POA: Insufficient documentation

## 2016-12-30 DIAGNOSIS — F418 Other specified anxiety disorders: Secondary | ICD-10-CM | POA: Insufficient documentation

## 2016-12-30 DIAGNOSIS — Z3A12 12 weeks gestation of pregnancy: Secondary | ICD-10-CM | POA: Diagnosis not present

## 2016-12-30 DIAGNOSIS — O99841 Bariatric surgery status complicating pregnancy, first trimester: Secondary | ICD-10-CM | POA: Insufficient documentation

## 2016-12-30 DIAGNOSIS — O26891 Other specified pregnancy related conditions, first trimester: Secondary | ICD-10-CM | POA: Insufficient documentation

## 2016-12-30 LAB — CBC
HEMATOCRIT: 33.1 % — AB (ref 36.0–46.0)
Hemoglobin: 11.7 g/dL — ABNORMAL LOW (ref 12.0–15.0)
MCH: 32.2 pg (ref 26.0–34.0)
MCHC: 35.3 g/dL (ref 30.0–36.0)
MCV: 91.2 fL (ref 78.0–100.0)
Platelets: 273 10*3/uL (ref 150–400)
RBC: 3.63 MIL/uL — ABNORMAL LOW (ref 3.87–5.11)
RDW: 14.8 % (ref 11.5–15.5)
WBC: 8.8 10*3/uL (ref 4.0–10.5)

## 2016-12-30 MED ORDER — HYDROCORTISONE ACE-PRAMOXINE 1-1 % RE FOAM: 1.0000 | Freq: Two times a day (BID) | RECTAL | 0 refills | Status: DC

## 2016-12-30 NOTE — MAU Provider Note (Signed)
Chief Complaint: Rectal Bleeding   First Provider Initiated Contact with Patient 12/30/16 2311        SUBJECTIVE HPI: Sarah Estes is a 26 y.o. G1P0 at [redacted]w[redacted]d by LMP who presents to maternity admissions reporting rectal bleeding with bowel movement for several days. Saw small clots today.  Denies diarrhea or constipation.  Has a history of endometriosis in rectum at age 78 but states it was treated with laser. No followup.  No known hemorrhoids. . She denies vaginal bleeding, vaginal itching/burning, urinary symptoms, h/a, dizziness, n/v, or fever/chills.    Rectal Bleeding   The current episode started 3 to 5 days ago. The problem occurs frequently (twice). The patient is experiencing no pain. The stool is described as mixed with blood. There was no prior successful therapy. There was no prior unsuccessful therapy. Pertinent negatives include no anorexia, no fever, no abdominal pain, no diarrhea, no hemorrhoids (no known ), no nausea, no rectal pain, no vomiting, no vaginal bleeding and no vaginal discharge. She has been eating and drinking normally.    RN Note: Hx gastric bypass surgery. THurs, Fri  and today saw bright blood when had BM. Tonight saw pea-sized blood clots. No hemorrhoids as far as she knows. This happened age 69 and was told had endometriosis in rectum. Had surgery and no further rectal bleeding until Thurs. Some "gas pains" currently. Occ round ligament pain  Past Medical History:  Diagnosis Date  . Adult ADHD   . Allergy   . Anorexia   . Anxiety   . Anxiety and depression   . Asthma   . Depression   . Endometriosis   . H/O gastric bypass   . Narcolepsy and cataplexy   . Pseudotumor cerebri 2011   Past Surgical History:  Procedure Laterality Date  . COLONOSCOPY    . COSMETIC SURGERY    . GASTRIC BYPASS    . LAPAROSCOPIC ENDOMETRIOSIS FULGURATION    . LUMBAR PUNCTURE     Social History   Social History  . Marital status: Single    Spouse name: n/a  .  Number of children: 0  . Years of education: 14+   Occupational History  . pharmacy tech [employer]    also at [employer]   Social History Main Topics  . Smoking status: Former Smoker    Types: E-cigarettes  . Smokeless tobacco: Never Used  . Alcohol use 0.0 oz/week     Comment: Socially  . Drug use: No  . Sexual activity: Yes    Partners: Male   Other Topics Concern  . Not on file   Social History Narrative   Pt lives at home with her mom, dad, and two brothers.   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    Current Outpatient Prescriptions on File Prior to Encounter  Medication Sig Dispense Refill  . busPIRone (BUSPAR) 10 MG tablet Take 2 tablets (20 mg total) by mouth 3 (three) times daily. 540 tablet 0  . escitalopram (LEXAPRO) 20 MG tablet Take 1.5 tablets (30 mg total) by mouth daily. 135 tablet 0  . modafinil (PROVIGIL) 200 MG tablet 1 tablet in the morning, 1 at noon 60 tablet 5  . Norethindrone-Ethinyl Estradiol-Fe (GENERESSE) 0.8-25 MG-MCG tablet Chew 1 tablet by mouth daily. (Patient not taking: Reported on 11/06/2016) 1 Package 11  . Sodium Oxybate (XYREM)  500 MG/ML SOLN 4.500 mg twice at night. 270 mL 1   Allergies  Allergen Reactions  . Corticosteroids Other (See Comments)    Injections cannot take due to gastric surgery (creams or tablets?)  . Gluten Meal Nausea Only and Other (See Comments)    Exacerbates endometriosis symptoms    . Nsaids Other (See Comments)    Had gastric bypass surgery   . Sulfa Antibiotics Hives, Itching and Rash  . Morphine And Related Itching    I have reviewed patient's Past Medical Hx, Surgical Hx, Family Hx, Social Hx, medications and allergies.   ROS:  Review of Systems  Constitutional: Negative for fever.  Gastrointestinal: Positive for hematochezia. Negative for abdominal pain, anorexia, diarrhea,  hemorrhoids (no known ), nausea, rectal pain and vomiting.  Genitourinary: Negative for vaginal bleeding and vaginal discharge.   Review of Systems  Other systems negative   Physical Exam  Physical Exam Patient Vitals for the past 24 hrs:  BP Temp Pulse Resp Height Weight  12/30/16 2147 133/65 98.2 F (36.8 C) 76 18  (1.753 m) 199 lb (90.3 kg)   Constitutional: Well-developed, well-nourished female in no acute distress.  Cardiovascular: normal rate Respiratory: normal effort GI: Abd soft, non-tender. Pos BS x 4      Small hemorrhoidal skin tag on anus, no obvious bleeding or hemorrhoids MS: Extremities nontender, no edema, normal ROM Neurologic: Alert and oriented x 4.  GU: Neg CVAT.  PELVIC EXAM: deferred  FHT 158 by doppler  LAB RESULTS Results for orders placed or performed during the hospital encounter of 12/30/16 (from the past 24 hour(s))  CBC     Status: Abnormal   Collection Time: 12/30/16 10:35 PM  Result Value Ref Range   WBC 8.8 4.0 - 10.5 K/uL   RBC 3.63 (L) 3.87 - 5.11 MIL/uL   Hemoglobin 11.7 (L) 12.0 - 15.0 g/dL   HCT 16.1 (L) 09.6 - 04.5 %   MCV 91.2 78.0 - 100.0 fL   MCH 32.2 26.0 - 34.0 pg   MCHC 35.3 30.0 - 36.0 g/dL   RDW 40.9 81.1 - 91.4 %   Platelets 273 150 - 400 K/uL  Type and screen     Status: None (Preliminary result)   Collection Time: 12/30/16 10:35 PM  Result Value Ref Range   ABO/RH(D) A POS    Antibody Screen PENDING    Sample Expiration 01/02/2017     --/--/A POS (09/29 2235)  IMAGING No results found.  MAU Management/MDM: Discussed with Dr Marcelle Overlie presentation and exam findings Reassured patient she is not anemic as she thought Will Rx Proctofoam HC and followup in office   ASSESSMENT Single IUP at [redacted]w[redacted]d Rectal bleeding, possible hemorrhoids History of endometriosis in rectum  PLAN Discharge home Rx Proctofoam HC prn use Follow up in office  Pt stable at time of discharge. Encouraged to return here or to  other Urgent Care/ED if she develops worsening of symptoms, increase in pain, fever, or other concerning symptoms.    Wynelle Bourgeois CNM, MSN Certified Nurse-Midwife 12/30/2016  11:28 PM

## 2016-12-30 NOTE — MAU Note (Signed)
Hx gastric bypass surgery. THurs, Fri  and today saw bright blood when had BM. Tonight saw pea-sized blood clots. No hemorrhoids as far as she knows. This happened age 26 and was told had endometriosis in rectum. Had surgery and no further rectal bleeding until Thurs. Some "gas pains" currently. Occ round ligament pain

## 2016-12-30 NOTE — Progress Notes (Signed)
Written and verbal d/c instructions given and understanding voiced. 

## 2016-12-30 NOTE — Discharge Instructions (Signed)
Rectal Bleeding Rectal bleeding is when blood passes out of the anus. People with rectal bleeding may notice bright red blood in their underwear or in the toilet after having a bowel movement. They may also have dark red or black stools. Rectal bleeding is usually a sign that something is wrong. Many things can cause rectal bleeding, including:  Hemorrhoids. These are blood vessels in the anus or rectum that are larger than normal.  Fistulas. These are abnormal passages in the rectum and anus.  Anal fissures. This is a tear in the anus.  Diverticulosis. This is a condition in which pockets or sacs project from the bowel.  Proctitis and colitis. These are conditions in which the rectum, colon, or anus become inflamed.  Polyps. These are growths that can be cancerous (malignant) or non-cancerous (benign).  Part of the rectum sticking out from the anus (rectal prolapse).  Certain medicines.  Intestinal infections.  Follow these instructions at home: Pay attention to any changes in your symptoms. Take these actions to help lessen bleeding and discomfort:  Eat a diet that is high in fiber. This will keep your stool soft, making it easier to pass stools without straining. Ask your health care provider what foods and drinks are high in fiber.  Drink enough fluid to keep your urine clear or pale yellow. This also helps to keep your stool soft.  Try taking a warm bath. This may help soothe any pain in your rectum.  Keep all follow-up visits as told by your health care provider. This is important.  Get help right away if:  You have new or increased rectal bleeding.  You have black or dark red stools.  You vomit blood or something that looks like coffee grounds.  You have pain or tenderness in your abdomen.  You have a fever.  You feel weak.  You feel nauseous.  You faint.  You have severe pain in your rectum.  You cannot have a bowel movement. This information is not  intended to replace advice given to you by your health care provider. Make sure you discuss any questions you have with your health care provider. Document Released: 09/09/2001 Document Revised: 08/26/2015 Document Reviewed: 05/16/2015 Elsevier Interactive Patient Education  2018 [employer]  

## 2016-12-31 LAB — ABO/RH: ABO/RH(D): A POS

## 2016-12-31 LAB — TYPE AND SCREEN
ABO/RH(D): A POS
ANTIBODY SCREEN: NEGATIVE

## 2017-02-05 ENCOUNTER — Encounter (HOSPITAL_COMMUNITY): Payer: Self-pay | Admitting: Student

## 2017-02-05 ENCOUNTER — Inpatient Hospital Stay (HOSPITAL_COMMUNITY)
Admission: AD | Admit: 2017-02-05 | Discharge: 2017-02-05 | Disposition: A | Discharge status: Home / Self Care | Payer: BC Managed Care – PPO | Source: Ambulatory Visit | Attending: Obstetrics and Gynecology | Admitting: Obstetrics and Gynecology

## 2017-02-05 DIAGNOSIS — B379 Candidiasis, unspecified: Secondary | ICD-10-CM | POA: Diagnosis not present

## 2017-02-05 DIAGNOSIS — Z3A18 18 weeks gestation of pregnancy: Secondary | ICD-10-CM | POA: Diagnosis not present

## 2017-02-05 DIAGNOSIS — O26899 Other specified pregnancy related conditions, unspecified trimester: Secondary | ICD-10-CM

## 2017-02-05 DIAGNOSIS — O26892 Other specified pregnancy related conditions, second trimester: Secondary | ICD-10-CM

## 2017-02-05 DIAGNOSIS — Z87891 Personal history of nicotine dependence: Secondary | ICD-10-CM | POA: Diagnosis not present

## 2017-02-05 DIAGNOSIS — R1030 Lower abdominal pain, unspecified: Secondary | ICD-10-CM

## 2017-02-05 DIAGNOSIS — O98812 Other maternal infectious and parasitic diseases complicating pregnancy, second trimester: Secondary | ICD-10-CM | POA: Diagnosis not present

## 2017-02-05 LAB — URINALYSIS, ROUTINE W REFLEX MICROSCOPIC
BILIRUBIN URINE: NEGATIVE
Glucose, UA: NEGATIVE mg/dL
HGB URINE DIPSTICK: NEGATIVE
KETONES UR: NEGATIVE mg/dL
Leukocytes, UA: NEGATIVE
NITRITE: NEGATIVE
Protein, ur: NEGATIVE mg/dL
SPECIFIC GRAVITY, URINE: 1.004 — AB (ref 1.005–1.030)
pH: 6 (ref 5.0–8.0)

## 2017-02-05 LAB — WET PREP, GENITAL
CLUE CELLS WET PREP: NONE SEEN
Sperm: NONE SEEN
Trich, Wet Prep: NONE SEEN

## 2017-02-05 MED ORDER — FLUCONAZOLE 150 MG PO TABS: 150.0000 mg | ORAL_TABLET | Freq: Once | ORAL | 0 refills | Status: AC

## 2017-02-05 NOTE — MAU Note (Signed)
Pt presents to MAU with complaints of pelvic pressure. Denies any VB or abnormal discharge

## 2017-02-05 NOTE — MAU Note (Signed)
Urine in lab 

## 2017-02-05 NOTE — Discharge Instructions (Signed)
Vaginal Yeast infection, Adult Vaginal yeast infection is a condition that causes soreness, swelling, and redness (inflammation) of the vagina. It also causes vaginal discharge. This is a common condition. Some women get this infection frequently. What are the causes? This condition is caused by a change in the normal balance of the yeast (candida) and bacteria that live in the vagina. This change causes an overgrowth of yeast, which causes the inflammation. What increases the risk? This condition is more likely to develop in:  Women who take antibiotic medicines.  Women who have diabetes.  Women who take birth control pills.  Women who are pregnant.  Women who douche often.  Women who have a weak defense (immune) system.  Women who have been taking steroid medicines for a long time.  Women who frequently wear tight clothing.  What are the signs or symptoms? Symptoms of this condition include:  White, thick vaginal discharge.  Swelling, itching, redness, and irritation of the vagina. The lips of the vagina (vulva) may be affected as well.  Pain or a burning feeling while urinating.  Pain during sex.  How is this diagnosed? This condition is diagnosed with a medical history and physical exam. This will include a pelvic exam. Your health care provider will examine a sample of your vaginal discharge under a microscope. Your health care provider may send this sample for testing to confirm the diagnosis. How is this treated? This condition is treated with medicine. Medicines may be over-the-counter or prescription. You may be told to use one or more of the following:  Medicine that is taken orally.  Medicine that is applied as a cream.  Medicine that is inserted directly into the vagina (suppository).  Follow these instructions at home:  Take or apply over-the-counter and prescription medicines only as told by your health care provider.  Do not have sex until your health  care provider has approved. Tell your sex partner that you have a yeast infection. That person should go to his or her health care provider if he or she develops symptoms.  Do not wear tight clothes, such as pantyhose or tight pants.  Avoid using tampons until your health care provider approves.  Eat more yogurt. This may help to keep your yeast infection from returning.  Try taking a sitz bath to help with discomfort. This is a warm water bath that is taken while you are sitting down. The water should only come up to your hips and should cover your buttocks. Do this 3-4 times per day or as told by your health care provider.  Do not douche.  Wear breathable, cotton underwear.  If you have diabetes, keep your blood sugar levels under control. Contact a health care provider if:  You have a fever.  Your symptoms go away and then return.  Your symptoms do not get better with treatment.  Your symptoms get worse.  You have new symptoms.  You develop blisters in or around your vagina.  You have blood coming from your vagina and it is not your menstrual period.  You develop pain in your abdomen. This information is not intended to replace advice given to you by your health care provider. Make sure you discuss any questions you have with your health care provider. Document Released: 12/28/2004 Document Revised: 09/01/2015 Document Reviewed: 09/21/2014 Elsevier Interactive Patient Education  2018 [employer] Abdominal Pain During Pregnancy Belly (abdominal) pain is common during pregnancy. Most of the time, it is not a serious problem.  Other times, it can be a sign that something is wrong with the pregnancy. Always tell your doctor if you have belly pain. Follow these instructions at home: Monitor your belly pain for any changes. The following actions may help you feel better:  Do not have sex (intercourse) or put anything in your vagina until you feel better.  Rest until your pain  stops.  Drink clear fluids if you feel sick to your stomach (nauseous). Do not eat solid food until you feel better.  Only take medicine as told by your doctor.  Keep all doctor visits as told.  Get help right away if:  You are bleeding, leaking fluid, or pieces of tissue come out of your vagina.  You have more pain or cramping.  You keep throwing up (vomiting).  You have pain when you pee (urinate) or have blood in your pee.  You have a fever.  You do not feel your baby moving as much.  You feel very weak or feel like passing out.  You have trouble breathing, with or without belly pain.  You have a very bad headache and belly pain.  You have fluid leaking from your vagina and belly pain.  You keep having watery poop (diarrhea).  Your belly pain does not go away after resting, or the pain gets worse. This information is not intended to replace advice given to you by your health care provider. Make sure you discuss any questions you have with your health care provider. Document Released: 03/08/2009 Document Revised: 10/27/2015 Document Reviewed: 10/17/2012 Elsevier Interactive Patient Education  [employer].

## 2017-02-05 NOTE — MAU Provider Note (Signed)
History     CSN: [id number]  Arrival date & time 02/05/17  1632   First Provider Initiated Contact with Patient 02/05/17 1914      Chief Complaint  Patient presents with  . pelvic pressure    Sarah Estes is a 26 yo G1P0 at 3631w0d who presents with pelvic pressure. Pt states the pain started yesterday. She reports associated sharp pain in her vagina. Pt denies any fever, dysuria, urgency, vaginal bleeding or discharge, or headache. She reports some leg swelling but no leg pain.Patient says sometimes the pain is worse when walking and shoots down her right leg.    Past Medical History:  Diagnosis Date  . Adult ADHD   . Allergy   . Anorexia   . Anxiety and depression   . Asthma   . Endometriosis   . H/O gastric bypass   . Narcolepsy and cataplexy   . Pseudotumor cerebri 2011    Past Surgical History:  Procedure Laterality Date  . COLONOSCOPY    . COSMETIC SURGERY    . GASTRIC BYPASS    . LAPAROSCOPIC ENDOMETRIOSIS FULGURATION    . LUMBAR PUNCTURE      Family History  Problem Relation Age of Onset  . Heart disease Maternal Grandfather   . Hypertension Maternal Grandfather   . Stroke Maternal Grandfather   . Diabetes Father   . Hypertension Father   . Hypertension Paternal Grandmother   . Mental retardation Paternal Grandmother   . Diabetes Paternal Grandfather   . Hyperlipidemia Paternal Grandfather   . Hypertension Paternal Grandfather     Social History   Tobacco Use  . Smoking status: Former Smoker    Types: E-cigarettes  . Smokeless tobacco: Never Used  Substance Use Topics  . Alcohol use: Yes    Alcohol/week: 0.0 oz    Comment: Socially  . Drug use: No    OB History    Gravida Para Term Preterm AB Living   1             SAB TAB Ectopic Multiple Live Births                  Review of Systems  Constitutional: Negative for appetite change, chills and fever.  HENT: Negative for congestion.   Eyes: Negative for discharge and visual disturbance.   Respiratory: Negative for chest tightness, shortness of breath and wheezing.   Cardiovascular: Positive for leg swelling. Negative for chest pain and palpitations.  Gastrointestinal: Positive for nausea. Negative for abdominal distention, abdominal pain, blood in stool and vomiting.  Genitourinary: Positive for pelvic pain and vaginal pain. Negative for difficulty urinating, dysuria, flank pain, hematuria, urgency, vaginal bleeding and vaginal discharge.  Musculoskeletal: Negative for back pain.  Skin: Negative for color change and rash.  Neurological: Negative for dizziness and headaches.  Psychiatric/Behavioral: Negative for agitation and confusion.    Allergies  Corticosteroids; Gluten meal; Nsaids; Sulfa antibiotics; and Morphine and related  Home Medications    BP 127/78   Pulse 74   Temp 98.4 F (36.9 C)   Resp 16   Wt 93.9 kg (207 lb)   LMP 09/07/2016   BMI 30.57 kg/m   Physical Exam  Constitutional: She appears well-developed and well-nourished. No distress.  HENT:  Head: Normocephalic and atraumatic.  Eyes: Right eye exhibits no discharge. Left eye exhibits no discharge.  Neck: Normal range of motion. Neck supple.  Cardiovascular: Normal rate, regular rhythm, normal heart sounds and intact distal pulses. Exam reveals  no gallop and no friction rub.  No murmur heard. Pulmonary/Chest: Effort normal and breath sounds normal.  Abdominal: Soft. Bowel sounds are normal. She exhibits no distension and no mass. There is no tenderness. There is no rebound and no guarding.  Musculoskeletal: Normal range of motion. She exhibits edema.  Skin: Skin is warm and dry. No rash noted.  Psychiatric: She has a normal mood and affect.  Speculum exam: closed cervix, minimal white discharge  MAU Course  Procedures (including critical care time)  Labs Reviewed  URINALYSIS, ROUTINE W REFLEX MICROSCOPIC - Abnormal; Notable for the following components:      Result Value   Color, Urine  COLORLESS (*)    Specific Gravity, Urine 1.004 (*)    All other components within normal limits   No results found.   No diagnosis found.    MDM  Urinalysis negative for UTI.  Pending gc/chlamydia and wet prep  A/P 1. Pregnancy at [redacted] weeks gestation- routine follow up outpatient 2. Yeast infection- treat with diflucan   Rolm Bookbinder, DO

## 2017-02-06 LAB — GC/CHLAMYDIA PROBE AMP (~~LOC~~) NOT AT ARMC
CHLAMYDIA, DNA PROBE: NEGATIVE
Neisseria Gonorrhea: NEGATIVE

## 2017-03-07 ENCOUNTER — Encounter (HOSPITAL_COMMUNITY): Payer: Self-pay

## 2017-03-07 ENCOUNTER — Inpatient Hospital Stay (HOSPITAL_COMMUNITY)
Admission: AD | Admit: 2017-03-07 | Discharge: 2017-03-07 | Disposition: A | Discharge status: Home / Self Care | Payer: BC Managed Care – PPO | Source: Ambulatory Visit | Attending: Obstetrics & Gynecology | Admitting: Obstetrics & Gynecology

## 2017-03-07 DIAGNOSIS — O9989 Other specified diseases and conditions complicating pregnancy, childbirth and the puerperium: Secondary | ICD-10-CM

## 2017-03-07 DIAGNOSIS — O26892 Other specified pregnancy related conditions, second trimester: Secondary | ICD-10-CM | POA: Diagnosis not present

## 2017-03-07 DIAGNOSIS — Z87891 Personal history of nicotine dependence: Secondary | ICD-10-CM | POA: Insufficient documentation

## 2017-03-07 DIAGNOSIS — O4692 Antepartum hemorrhage, unspecified, second trimester: Secondary | ICD-10-CM | POA: Insufficient documentation

## 2017-03-07 DIAGNOSIS — Z3A22 22 weeks gestation of pregnancy: Secondary | ICD-10-CM | POA: Insufficient documentation

## 2017-03-07 DIAGNOSIS — Z3492 Encounter for supervision of normal pregnancy, unspecified, second trimester: Secondary | ICD-10-CM

## 2017-03-07 DIAGNOSIS — R55 Syncope and collapse: Secondary | ICD-10-CM | POA: Diagnosis not present

## 2017-03-07 LAB — CBC
HEMATOCRIT: 38.3 % (ref 36.0–46.0)
HEMOGLOBIN: 13.1 g/dL (ref 12.0–15.0)
MCH: 33.6 pg (ref 26.0–34.0)
MCHC: 34.2 g/dL (ref 30.0–36.0)
MCV: 98.2 fL (ref 78.0–100.0)
Platelets: 320 10*3/uL (ref 150–400)
RBC: 3.9 MIL/uL (ref 3.87–5.11)
RDW: 14 % (ref 11.5–15.5)
WBC: 11.5 10*3/uL — ABNORMAL HIGH (ref 4.0–10.5)

## 2017-03-07 LAB — URINALYSIS, ROUTINE W REFLEX MICROSCOPIC
Bilirubin Urine: NEGATIVE
Glucose, UA: NEGATIVE mg/dL
KETONES UR: NEGATIVE mg/dL
Nitrite: NEGATIVE
PROTEIN: NEGATIVE mg/dL
Specific Gravity, Urine: 1.012 (ref 1.005–1.030)
pH: 5 (ref 5.0–8.0)

## 2017-03-07 LAB — GLUCOSE, CAPILLARY: Glucose-Capillary: 73 mg/dL (ref 65–99)

## 2017-03-07 NOTE — MAU Provider Note (Signed)
History     CSN: [id number]  Arrival date and time: 03/07/17 1600   First Provider Initiated Contact with Patient 03/07/17 1734      Chief Complaint  Patient presents with  . Loss of Consciousness  . Vaginal Bleeding   HPI  Sarah Estes is a 26 y.o. G1P0 at [redacted]w[redacted]d who presents for syncopal episode & vaginal bleeding. Patient with history of narcolepsy & cataplexy who is established with a local neurologist.  Incident occurred at 2 pm today. She was getting up from the couch to walk across the room and passed out. States she was only out for a few seconds to minutes. Woke up lying on her side. Doesn't think she hit her head or abdomen. States this episode did not feel like her previous narcoleptic episodes. Denies headache, dizziness, visual disturbance.  Reports some lower abdominal cramping that has been ongoing & evaluated by her ob in the office -- no change in pain. Soon after the syncopal episode she noted pink spotting on toilet paper after voiding. Has not seen spotting or any other bleeding since then. Denies LOF, dysuria, or vaginal discharge. Positive fetal movement.  States anatomy scan could not be completed d/t fetal movement & she doesn't know location of placenta.   OB History    Gravida Para Term Preterm AB Living   1             SAB TAB Ectopic Multiple Live Births                  Past Medical History:  Diagnosis Date  . Adult ADHD   . Allergy   . Anorexia   . Anxiety and depression   . Asthma   . Endometriosis   . H/O gastric bypass   . Narcolepsy and cataplexy   . Pseudotumor cerebri 2011    Past Surgical History:  Procedure Laterality Date  . COLONOSCOPY    . COSMETIC SURGERY    . GASTRIC BYPASS    . LAPAROSCOPIC ENDOMETRIOSIS FULGURATION    . LUMBAR PUNCTURE      Family History  Problem Relation Age of Onset  . Heart disease Maternal Grandfather   . Hypertension Maternal Grandfather   . Stroke Maternal Grandfather   . Diabetes Father    . Hypertension Father   . Hypertension Paternal Grandmother   . Mental retardation Paternal Grandmother   . Diabetes Paternal Grandfather   . Hyperlipidemia Paternal Grandfather   . Hypertension Paternal Grandfather     Social History   Tobacco Use  . Smoking status: Former Smoker    Types: E-cigarettes  . Smokeless tobacco: Never Used  Substance Use Topics  . Alcohol use: Yes    Alcohol/week: 0.0 oz    Comment: Socially  . Drug use: No    Allergies:  Allergies  Allergen Reactions  . Corticosteroids Other (See Comments)    Injections cannot take due to gastric surgery (creams or tablets?)  . Gluten Meal Nausea Only and Other (See Comments)    Exacerbates endometriosis symptoms    . Nsaids Other (See Comments)    Had gastric bypass surgery   . Sulfa Antibiotics Hives, Itching and Rash  . Morphine And Related Itching    Medications Prior to Admission  Medication Sig Dispense Refill Last Dose  . diphenhydrAMINE (BENADRYL) 25 MG tablet Take 25 mg by mouth every 6 (six) hours as needed for allergies.   03/06/2017 at Unknown time  . doxylamine, Sleep, (UNISOM) 25  MG tablet Take 25 mg by mouth 2 (two) times daily as needed for sleep.    03/06/2017 at Unknown time  . Prenatal Vit-Fe Fumarate-FA (PRENATAL MULTIVITAMIN) TABS tablet Take 1 tablet by mouth daily at 12 noon.   03/06/2017 at Unknown time  . busPIRone (BUSPAR) 10 MG tablet Take 2 tablets (20 mg total) by mouth 3 (three) times daily. (Patient not taking: Reported on 03/07/2017) 540 tablet 0 Not Taking at Unknown time  . escitalopram (LEXAPRO) 20 MG tablet Take 1.5 tablets (30 mg total) by mouth daily. (Patient not taking: Reported on 03/07/2017) 135 tablet 0 Not Taking at Unknown time  . hydrocortisone-pramoxine (PROCTOFOAM HC) rectal foam Place 1 applicator rectally 2 (two) times daily. (Patient not taking: Reported on 03/07/2017) 10 g 0 Not Taking at Unknown time  . modafinil (PROVIGIL) 200 MG tablet 1 tablet in the morning,  1 at noon (Patient not taking: Reported on 03/07/2017) 60 tablet 5 Not Taking at Unknown time  . Sodium Oxybate (XYREM) 500 MG/ML SOLN 4.500 mg twice at night. (Patient not taking: Reported on 03/07/2017) 270 mL 1 Not Taking at Unknown time  . [DISCONTINUED] diphenhydrAMINE (BENADRYL) 12.5 MG/5ML liquid Take 25 mg 4 (four) times daily as needed by mouth for allergies.   02/05/2017 at Unknown time    Review of Systems  Constitutional: Negative.   Eyes: Negative for visual disturbance.  Cardiovascular: Negative for chest pain and palpitations.  Gastrointestinal: Positive for abdominal pain. Negative for constipation, diarrhea, nausea and vomiting.  Genitourinary: Positive for vaginal bleeding. Negative for dysuria and vaginal discharge.  Musculoskeletal: Negative for neck pain.  Neurological: Positive for syncope. Negative for dizziness, weakness, light-headedness and headaches.  Psychiatric/Behavioral: Negative.    Physical Exam   Blood pressure 126/70, pulse 82, temperature 98 F (36.7 C), temperature source Oral, resp. rate 15, height 5\' 9"  (1.753 m), weight 222 lb (100.7 kg), last menstrual period 09/07/2016, SpO2 100 %.  Orthostatic VS for the past 24 hrs:  BP- Lying Pulse- Lying BP- Sitting Pulse- Sitting BP- Standing at 0 minutes Pulse- Standing at 0 minutes  03/07/17 1721 124/67 77 118/74 84 116/80 88     Physical Exam  Nursing note and vitals reviewed. Constitutional: She is oriented to person, place, and time. She appears well-developed and well-nourished. No distress.  HENT:  Head: Normocephalic and atraumatic.  Eyes: Conjunctivae are normal. Right eye exhibits no discharge. Left eye exhibits no discharge. No scleral icterus.  Neck: Normal range of motion.  Cardiovascular: Normal rate, regular rhythm and normal heart sounds.  No murmur heard. Respiratory: Effort normal and breath sounds normal. No respiratory distress. She has no wheezes.  GI: Soft. Bowel sounds are normal.  There is no tenderness.  Genitourinary: No bleeding in the vagina.  Genitourinary Comments: Small amount of thin white discharge. No blood. Cervix visually closed.   Neurological: She is alert and oriented to person, place, and time.  Skin: Skin is warm and dry. She is not diaphoretic.  Psychiatric: She has a normal mood and affect. Her behavior is normal. Judgment and thought content normal.    MAU Course  Procedures Results for orders placed or performed during the hospital encounter of 03/07/17 (from the past 24 hour(s))  Urinalysis, Routine w reflex microscopic     Status: Abnormal   Collection Time: 03/07/17  4:07 PM  Result Value Ref Range   Color, Urine STRAW (A) YELLOW   APPearance HAZY (A) CLEAR   Specific Gravity, Urine 1.012 1.005 -  1.030   pH 5.0 5.0 - 8.0   Glucose, UA NEGATIVE NEGATIVE mg/dL   Hgb urine dipstick SMALL (A) NEGATIVE   Bilirubin Urine NEGATIVE NEGATIVE   Ketones, ur NEGATIVE NEGATIVE mg/dL   Protein, ur NEGATIVE NEGATIVE mg/dL   Nitrite NEGATIVE NEGATIVE   Leukocytes, UA LARGE (A) NEGATIVE   RBC / HPF 6-30 0 - 5 RBC/hpf   WBC, UA TOO NUMEROUS TO COUNT 0 - 5 WBC/hpf   Bacteria, UA RARE (A) NONE SEEN   Squamous Epithelial / LPF 0-5 (A) NONE SEEN   Mucus PRESENT   CBC     Status: Abnormal   Collection Time: 03/07/17  5:13 PM  Result Value Ref Range   WBC 11.5 (H) 4.0 - 10.5 K/uL   RBC 3.90 3.87 - 5.11 MIL/uL   Hemoglobin 13.1 12.0 - 15.0 g/dL   HCT 09.838.3 11.936.0 - 14.746.0 %   MCV 98.2 78.0 - 100.0 fL   MCH 33.6 26.0 - 34.0 pg   MCHC 34.2 30.0 - 36.0 g/dL   RDW 82.914.0 56.211.5 - 13.015.5 %   Platelets 320 150 - 400 K/uL  Glucose, capillary     Status: None   Collection Time: 03/07/17  5:18 PM  Result Value Ref Range   Glucose-Capillary 73 65 - 99 mg/dL    MDM FHT [id number] CBC, CBG, EKG, orthostatic VS --- pt stable & asymptomatic On exam, no bleeding noted & cervix visually closed Patient reassured C/w Dr. Langston MaskerMorris. Ok to discharge home  Assessment and Plan   A; 1. Syncope, unspecified syncope type   2. Fetal heart tones present, second trimester   3. [redacted] weeks gestation of pregnancy    P: Discharge home Increase water intake Slow position changed Discussed reasons to return to MAU Keep f/u with OB Consider f/u with neurologist if syncopal episodes continue   Judeth Hornrin Kandace Elrod 03/07/2017, 5:34 PM

## 2017-03-07 NOTE — Discharge Instructions (Signed)
Syncope °Syncope is when you temporarily lose consciousness. Syncope may also be called fainting or passing out. It is caused by a sudden decrease in blood flow to the brain. Even though most causes of syncope are not dangerous, syncope can be a sign of a serious medical problem. Signs that you may be about to faint include: °· Feeling dizzy or light-headed. °· Feeling nauseous. °· Seeing all white or all black in your field of vision. °· Having cold, clammy skin. °If you fainted, get medical help right away.Call your local emergency services (911 in the [place] Do not drive yourself to the hospital. °Follow these instructions at home: °Pay attention to any changes in your symptoms. Take these actions to help with your condition: °· Have someone stay with you until you feel stable. °· Do not drive, use machinery, or play sports until your health care provider says it is okay. °· Keep all follow-up visits as told by your health care provider. This is important. °· If you start to feel like you might faint, lie down right away and raise (elevate) your feet above the level of your heart. Breathe deeply and steadily. Wait until all of the symptoms have passed. °· Drink enough fluid to keep your urine clear or pale yellow. °· If you are taking blood pressure or heart medicine, get up slowly and take several minutes to sit and then stand. This can reduce dizziness. °· Take over-the-counter and prescription medicines only as told by your health care provider. °Get help right away if: °· You have a severe headache. °· You have unusual pain in your chest, abdomen, or back. °· You are bleeding from your mouth or rectum, or you have black or tarry stool. °· You have a very fast or irregular heartbeat (palpitations). °· You have pain with breathing. °· You faint once or repeatedly. °· You have a seizure. °· You are confused. °· You have trouble walking. °· You have severe weakness. °· You have vision problems. °These symptoms  may represent a serious problem that is an emergency. Do not wait to see if your symptoms will go away. Get medical help right away. Call your local emergency services (911 in the [place] Do not drive yourself to the hospital. °This information is not intended to replace advice given to you by your health care provider. Make sure you discuss any questions you have with your health care provider. °Document Released: 03/20/2005 Document Revised: 08/26/2015 Document Reviewed: 12/02/2014 °Elsevier Interactive Patient Education © 2017 [employer] ° °

## 2017-03-07 NOTE — MAU Note (Signed)
Pt reports she passed out sometime around 2 pm, was home alone and awakened on the floor on her side.states she noticed some spotting when she went to the restroom.

## 2017-03-30 ENCOUNTER — Inpatient Hospital Stay (HOSPITAL_COMMUNITY)
Admission: AD | Admit: 2017-03-30 | Discharge: 2017-03-30 | Disposition: A | Discharge status: Home / Self Care | Payer: BC Managed Care – PPO | Source: Ambulatory Visit | Attending: Obstetrics and Gynecology | Admitting: Obstetrics and Gynecology

## 2017-03-30 ENCOUNTER — Encounter (HOSPITAL_COMMUNITY): Payer: Self-pay | Admitting: *Deleted

## 2017-03-30 DIAGNOSIS — Z9884 Bariatric surgery status: Secondary | ICD-10-CM | POA: Insufficient documentation

## 2017-03-30 DIAGNOSIS — Z888 Allergy status to other drugs, medicaments and biological substances status: Secondary | ICD-10-CM | POA: Diagnosis not present

## 2017-03-30 DIAGNOSIS — Z882 Allergy status to sulfonamides status: Secondary | ICD-10-CM | POA: Insufficient documentation

## 2017-03-30 DIAGNOSIS — Z3A25 25 weeks gestation of pregnancy: Secondary | ICD-10-CM | POA: Diagnosis not present

## 2017-03-30 DIAGNOSIS — O4692 Antepartum hemorrhage, unspecified, second trimester: Secondary | ICD-10-CM | POA: Diagnosis not present

## 2017-03-30 DIAGNOSIS — Z87891 Personal history of nicotine dependence: Secondary | ICD-10-CM | POA: Diagnosis not present

## 2017-03-30 DIAGNOSIS — Z833 Family history of diabetes mellitus: Secondary | ICD-10-CM | POA: Diagnosis not present

## 2017-03-30 DIAGNOSIS — Z8249 Family history of ischemic heart disease and other diseases of the circulatory system: Secondary | ICD-10-CM | POA: Insufficient documentation

## 2017-03-30 DIAGNOSIS — O2242 Hemorrhoids in pregnancy, second trimester: Secondary | ICD-10-CM | POA: Diagnosis not present

## 2017-03-30 DIAGNOSIS — N939 Abnormal uterine and vaginal bleeding, unspecified: Secondary | ICD-10-CM | POA: Diagnosis present

## 2017-03-30 DIAGNOSIS — Z885 Allergy status to narcotic agent status: Secondary | ICD-10-CM | POA: Diagnosis not present

## 2017-03-30 DIAGNOSIS — Z886 Allergy status to analgesic agent status: Secondary | ICD-10-CM | POA: Insufficient documentation

## 2017-03-30 DIAGNOSIS — Z8742 Personal history of other diseases of the female genital tract: Secondary | ICD-10-CM | POA: Diagnosis not present

## 2017-03-30 LAB — WET PREP, GENITAL
CLUE CELLS WET PREP: NONE SEEN
Sperm: NONE SEEN
Trich, Wet Prep: NONE SEEN
Yeast Wet Prep HPF POC: NONE SEEN

## 2017-03-30 LAB — URINALYSIS, ROUTINE W REFLEX MICROSCOPIC
BILIRUBIN URINE: NEGATIVE
GLUCOSE, UA: NEGATIVE mg/dL
KETONES UR: NEGATIVE mg/dL
NITRITE: NEGATIVE
PH: 7 (ref 5.0–8.0)
PROTEIN: NEGATIVE mg/dL
SQUAMOUS EPITHELIAL / LPF: NONE SEEN
Specific Gravity, Urine: 1.003 — ABNORMAL LOW (ref 1.005–1.030)

## 2017-03-30 NOTE — MAU Provider Note (Signed)
History  CSN: [id number] Arrival date and time: 03/30/17 0531  First Provider Initiated Contact with Patient 03/30/17 [phone]      Chief Complaint  Patient presents with  . Vaginal Bleeding    HPI: Sarah Estes is a 26 y.o. G1P0 with IUP at [redacted]w[redacted]d who presents to maternity admissions reporting vaginal bleeding. She reports she woke up at 4 am and noted bright red blood in her underwear that soaked through her pajamas pants. She did not wear a pad, but did not have any additional blood soak through her clothes, but reports noting additional blood in while wiping after urinating here. Denies seeing blood in the toilet or urine. She does endorse noting blood in stool yesterday and reports history of hemorrhoids, but reports that she bleeding this morning felt like it had come from her vagina, not her hemorrhoids.  Denies contractions, or leakage of fluid. Reports she had not felt baby move much since around 10 am yesterday, but this morning drank some coffee and baby has been active since.    She receives [facility] at [facility].   OB History  Gravida Para Term Preterm AB Living  1            SAB TAB Ectopic Multiple Live Births               # Outcome Date GA Lbr Len/2nd Weight Sex Delivery Anes PTL Lv  1 Current              Past Medical History:  Diagnosis Date  . Adult ADHD   . Allergy   . Anorexia   . Anxiety and depression   . Asthma   . Endometriosis   . H/O gastric bypass   . Narcolepsy and cataplexy   . Pseudotumor cerebri 2011   Past Surgical History:  Procedure Laterality Date  . COLONOSCOPY    . COSMETIC SURGERY    . GASTRIC BYPASS    . LAPAROSCOPIC ENDOMETRIOSIS FULGURATION    . LUMBAR PUNCTURE     Family History  Problem Relation Age of Onset  . Heart disease Maternal Grandfather   . Hypertension Maternal Grandfather   . Stroke Maternal Grandfather   . Diabetes Father   . Hypertension Father   . Hypertension Paternal Grandmother   . Mental  retardation Paternal Grandmother   . Diabetes Paternal Grandfather   . Hyperlipidemia Paternal Grandfather   . Hypertension Paternal Grandfather    Social History   Socioeconomic History  . Marital status: Single    Spouse name: n/a  . Number of children: 0  . Years of education: 14+  . Highest education level: Not on file  Social Needs  . Financial resource strain: Not on file  . Food insecurity - worry: Not on file  . Food insecurity - inability: Not on file  . Transportation needs - medical: Not on file  . Transportation needs - non-medical: Not on file  Occupational History  . Occupation: Chief Executive Officer: [employer]    Comment: also at [employer]  . Smoking status: Former Smoker    Types: E-cigarettes  . Smokeless tobacco: Never Used  Substance and Sexual Activity  . Alcohol use: No    Alcohol/week: 0.0 oz    Frequency: Never    Comment: Socially  . Drug use: No  . Sexual activity: No    Partners: Male    Birth control/protection: None  Other Topics Concern  .  Not on file  Social History Narrative   Pt lives at home with her mom, dad, and two brothers.   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 Corticosteroids Other (See Comments)    Injections cannot take due to gastric surgery (creams or tablets?)  . Gluten Meal Nausea Only and Other (See Comments)    Exacerbates endometriosis symptoms    . Nsaids Other (See Comments)    Had gastric bypass surgery   . Sulfa Antibiotics Hives, Itching and Rash  . Morphine And Related Itching    Medications Prior to Admission  Medication Sig Dispense Refill Last Dose  . diphenhydrAMINE (BENADRYL) 25 MG tablet Take 25 mg by mouth every 6 (six) hours as needed for allergies.   Past Month at Unknown time  . doxylamine, Sleep, (UNISOM) 25 MG tablet Take 25 mg by mouth 2 (two) times daily as needed for sleep.     03/29/2017 at Unknown time  . Prenatal Vit-Fe Fumarate-FA (PRENATAL MULTIVITAMIN) TABS tablet Take 1 tablet by mouth daily at 12 noon.   03/30/2017 at Unknown time    I have reviewed patient's Past Medical Hx, Surgical Hx, Family Hx, Social Hx, medications and allergies.   Review of Systems: Negative except for what is mentioned in HPI.  Physical Exam   Blood pressure 125/75, pulse 90, temperature 97.9 F (36.6 C), temperature source Oral, resp. rate 18, height 5\' 9"  (1.753 m), weight 225 lb (102.1 kg), last menstrual period 09/07/2016.  Constitutional: Well-developed, well-nourished female in no acute distress.  HENT: [place]/AT, normal oropharynx mucosa. MMM Eyes: normal conjunctivae, no scleral icterus Cardiovascular: normal rate Respiratory: normal effort GI: Abd soft, non-tender, gravid appropriate for gestational age.   Pelvic: NEFG. Normal vaginal mucosa without lesions. Cervix pink, visually closed, without lesion, discharge or any blood noted. No blood noted on swabs. Cervix closed and long.  Rectal: external hemorrhoid noted; no palpable masses on digital rectal exam; blood-tinge noted on glove  MSK: Extremities nontender, no edema Neurologic: Alert and oriented x 4. Psych: Normal mood and affect Skin: warm and dry   FHT:  Baseline 150 , moderate variability, 10x10 accelerations present, no decelerations Toco: no ctx Bedside ultrasound: posterior fundal placenta with placental edge visualized well above cervical os.  MAU Course/MDM:   Nursing notes and VS reviewed. Patient seen and examined, as noted above.  She is Rh positive Wet prep and cultures sent   Results reviewed:  Results for orders placed or performed during the hospital encounter of 03/30/17  Wet prep, genital  Result Value Ref Range   Yeast Wet Prep HPF POC NONE SEEN NONE SEEN   Trich, Wet Prep NONE SEEN NONE SEEN   Clue Cells Wet Prep HPF POC NONE SEEN NONE SEEN   WBC, Wet Prep HPF POC MODERATE (A) NONE  SEEN   Sperm NONE SEEN   Urinalysis, Routine w reflex microscopic  Result Value Ref Range   Color, Urine STRAW (A) YELLOW   APPearance HAZY (A) CLEAR   Specific Gravity, Urine 1.003 (L) 1.005 - 1.030   pH 7.0 5.0 - 8.0   Glucose, UA NEGATIVE NEGATIVE mg/dL   Hgb urine dipstick MODERATE (A) NEGATIVE   Bilirubin Urine NEGATIVE NEGATIVE   Ketones, ur NEGATIVE NEGATIVE mg/dL   Protein, ur NEGATIVE NEGATIVE mg/dL   Nitrite NEGATIVE NEGATIVE   Leukocytes, UA LARGE (A) NEGATIVE   RBC /  HPF 0-5 0 - 5 RBC/hpf   WBC, UA TOO NUMEROUS TO COUNT 0 - 5 WBC/hpf   Bacteria, UA RARE (A) NONE SEEN   Squamous Epithelial / LPF NONE SEEN NONE SEEN    Discussed with Dr. Langston MaskerMorris, agrees with discharge home with precautions.   Assessment and Plan  Assessment: 1. Second trimester bleeding   2. Hemorrhoids during pregnancy in second trimester   3. [redacted] weeks gestation of pregnancy     Plan: --Discharge home in stable condition.  --Bleeding precautions and other return precautions discussed.    Joyce Heitman, Kandra NicolasJulie P, MD 03/30/2017 7:57 AM

## 2017-03-30 NOTE — MAU Note (Addendum)
Pt presents to MAU c/o vaginal bleeding that started around 0400. Pt states the blood was bright red and that she had bled thru her pj pants. Pt did not have a pad to put on however when she went to wipe again in MAU br she saw more bright red bleeding. Pt states baby has not moved since yesterday morning at 1000. Pt denies vaginal dc and LOF.   Pt states she woke up as well during the middle of the night with a migraine and a charlie horse in her right calf.

## 2017-03-30 NOTE — Discharge Instructions (Signed)
Vaginal Bleeding During Pregnancy, Second Trimester A small amount of bleeding (spotting) from the vagina is common in pregnancy. Sometimes the bleeding is normal and is not a problem, and sometimes it is a sign of something serious. Be sure to tell your doctor about any bleeding from your vagina right away. Follow these instructions at home:  Watch your condition for any changes.  Follow your doctor's instructions about how active you can be.  If you are on bed rest: ? You may need to stay in bed and only get up to use the bathroom. ? You may be allowed to do some activities. ? If you need help, make plans for someone to help you.  Write down: ? The number of pads you use each day. ? How often you change pads. ? How soaked (saturated) your pads are.  Do not use tampons.  Do not douche.  Do not have sex or orgasms until your doctor says it is okay.  If you pass any tissue from your vagina, save the tissue so you can show it to your doctor.  Only take medicines as told by your doctor.  Do not take aspirin because it can make you bleed.  Do not exercise, lift heavy weights, or do any activities that take a lot of energy and effort unless your doctor says it is okay.  Keep all follow-up visits as told by your doctor. Contact a doctor if:  You bleed from your vagina.  You have cramps.  You have labor pains.  You have a fever that does not go away after you take medicine. Get help right away if:  You have very bad cramps in your back or belly (abdomen).  You have contractions.  You have chills.  You pass large clots or tissue from your vagina.  You bleed more.  You feel light-headed or weak.  You pass out (faint).  You are leaking fluid or have a gush of fluid from your vagina. This information is not intended to replace advice given to you by your health care provider. Make sure you discuss any questions you have with your health care provider. Document  Released: 08/04/2013 Document Revised: 08/26/2015 Document Reviewed: 11/25/2012 Elsevier Interactive Patient Education  2018 [employer]    Hemorrhoids Hemorrhoids are swollen veins in and around the rectum or anus. Hemorrhoids can cause pain, itching, or bleeding. Most of the time, they do not cause serious problems. They usually get better with diet changes, lifestyle changes, and other home treatments. Follow these instructions at home: Eating and drinking  Eat foods that have fiber, such as whole grains, beans, nuts, fruits, and vegetables. Ask your doctor about taking products that have added fiber (fibersupplements).  Drink enough fluid to keep your pee (urine) clear or pale yellow. For Pain and Swelling  Take a warm-water bath (sitz bath) for 20 minutes to ease pain. Do this 3-4 times a day.  If directed, put ice on the painful area. It may be helpful to use ice between your warm baths. ? Put ice in a plastic bag. ? Place a towel between your skin and the bag. ? Leave the ice on for 20 minutes, 2-3 times a day. General instructions  Take over-the-counter and prescription medicines only as told by your doctor. ? Medicated creams and medicines that are inserted into the anus (suppositories) may be used or applied as told.  Exercise often.  Go to the bathroom when you have the urge to poop (to  have a bowel movement). Do not wait.  Avoid pushing too hard (straining) when you poop.  Keep the butt area dry and clean. Use wet toilet paper or moist paper towels.  Do not sit on the toilet for a long time. Contact a doctor if:  You have any of these: ? Pain and swelling that do not get better with treatment or medicine. ? Bleeding that will not stop. ? Trouble pooping or you cannot poop. ? Pain or swelling outside the area of the hemorrhoids. This information is not intended to replace advice given to you by your health care provider. Make sure you discuss any questions you  have with your health care provider. Document Released: 12/28/2007 Document Revised: 08/26/2015 Document Reviewed: 12/02/2014 Elsevier Interactive Patient Education  [employer]2018 Elsevier Inc.

## 2017-03-31 LAB — CULTURE, OB URINE: Culture: NO GROWTH

## 2017-04-02 LAB — GC/CHLAMYDIA PROBE AMP (~~LOC~~) NOT AT ARMC
Chlamydia: NEGATIVE
NEISSERIA GONORRHEA: NEGATIVE

## 2017-04-08 NOTE — Progress Notes (Deleted)
[facility]  PATIENT: Sarah Estes DOB: 1990/12/02   REASON FOR VISIT: *** HISTORY FROM:    HISTORY OF PRESENT ILLNESS: Sarah Estes is a 27 y.o. female , seen here as a referral from Sarah Estes , [place]  For a sleep consultation.  Chief complaint according to patient : " I was diagnosed with narcolepsy"  Sarah Estes who has a history of normal pressure hydrocephalus and used to be treated during her childhood by Dr. Sharene Skeans for presumed seizures presents today after 3 year hiatus to our practice for a formal evaluation of a sleep disorder. Sarah Estes lived for 8 months during the year 2016 in [place], she suffered several spells of sudden loss of motor tone leading her to drop to the ground with losing awareness  of her surroundings, followed by a spell of sleep. She would also be confused in what was likely a postictal period. She presents today stating that she was diagnosed with narcolepsy and cataplexy through an EEG.(?) She was prescribed Adderall for daytime alertness which has worked well for her . She was also given Lunesta to help her with nocturnal sleep interruptions and fragmentation. She had no episodes or spells after this medication regimen was implemented. She has now moved back to [place] and will need a prescriber.  Sarah Estes brought her EEG recording report with her. The EEG was performed on 09/29/2014 showed a well-modulated 10-11 Hz posterior dominant rhythm appropriate bitemporal slowing during drowsiness, normal activation after hyperventilation, the EKG documented a normal sinus rhythm with a regular rate at 78 bpm. The patient fell asleep but deep sleep stages for not recorded vertex sharp waves and K complexes were noted. The EEG was normal without  focal, lateralized or epileptiform features. Another EEG was performed on 10/02/2014 after repeated episodes of amnesia and alteration in consciousness were reported. Again the patient  had a normal posterior background rhythm of 10 Hz, drowsiness was associated with bilaterally symmetric formation of vertex sharp waves and sleep spindles, the patient woke up at about 5:10 AM and from that point on the EEG had some motion related artifact. No electrographic seizures were seen. This EEG was a 24-hour video EEG.   Sleep habits are as follows: The patient reports that her usual bedtime during the week is between 8 and 9 PM, she usually falls asleep right after a meal or during TV time. She frequently arouses in the middle of the night and some nights she never goes to her bedroom but actually stays in her den. Her bedroom is without TV and she understands the sleep hygiene instructions. She has to arise at 4:15 to be at work at 5 AM, she usually gets 7 hours or more of nocturnal sleep time.  She also works at [employer] and she drinks a lot of caffeine. When she had Adderall available she did not need to drink 12 espressi. She has noted sometimes that she gets jittery that she has headaches that she has more urge to go to the bathroom and nausea and trembling. She is a nontobacco user and does not drink alcohol. Her [employer] shift usually ends between 1 and 3 PM. By that time she just wants to go home and nap. She endorsed today the Epworth sleepiness score at 18 points fatigue severity score was 53 points. The patient had been advised not to drive with this degree of sleepiness until she is treated.   REVIEW OF SYSTEMS: Full 14 system review of systems performed and  notable only for those listed, all others are neg:  Constitutional: neg  Cardiovascular: neg Ear/Nose/Throat: neg  Skin: neg Eyes: neg Respiratory: neg Gastroitestinal: neg  Hematology/Lymphatic: neg  Endocrine: neg Musculoskeletal:neg Allergy/Immunology: neg Neurological: neg Psychiatric: neg Sleep : neg   ALLERGIES: Allergies  Allergen Reactions  . Corticosteroids Other (See Comments)    Injections  cannot take due to gastric surgery (creams or tablets?)  . Gluten Meal Nausea Only and Other (See Comments)    Exacerbates endometriosis symptoms    . Nsaids Other (See Comments)    Had gastric bypass surgery   . Sulfa Antibiotics Hives, Itching and Rash  . Morphine And Related Itching    HOME MEDICATIONS: Outpatient Medications Prior to Visit  Medication Sig Dispense Refill  . diphenhydrAMINE (BENADRYL) 25 MG tablet Take 25 mg by mouth every 6 (six) hours as needed for allergies.    Marland Kitchen doxylamine, Sleep, (UNISOM) 25 MG tablet Take 25 mg by mouth 2 (two) times daily as needed for sleep.     . Prenatal Vit-Fe Fumarate-FA (PRENATAL MULTIVITAMIN) TABS tablet Take 1 tablet by mouth daily at 12 noon.     No facility-administered medications prior to visit.     PAST MEDICAL HISTORY: Past Medical History:  Diagnosis Date  . Adult ADHD   . Allergy   . Anorexia   . Anxiety and depression   . Asthma   . Endometriosis   . H/O gastric bypass   . Narcolepsy and cataplexy   . Pseudotumor cerebri 2011    PAST SURGICAL HISTORY: Past Surgical History:  Procedure Laterality Date  . COLONOSCOPY    . COSMETIC SURGERY    . GASTRIC BYPASS    . LAPAROSCOPIC ENDOMETRIOSIS FULGURATION    . LUMBAR PUNCTURE      FAMILY HISTORY: Family History  Problem Relation Age of Onset  . Heart disease Maternal Grandfather   . Hypertension Maternal Grandfather   . Stroke Maternal Grandfather   . Diabetes Father   . Hypertension Father   . Hypertension Paternal Grandmother   . Mental retardation Paternal Grandmother   . Diabetes Paternal Grandfather   . Hyperlipidemia Paternal Grandfather   . Hypertension Paternal Grandfather     SOCIAL HISTORY: Social History   Socioeconomic History  . Marital status: Single    Spouse name: n/a  . Number of children: 0  . Years of education: 14+  . Highest education level: Not on file  Social Needs  . Financial resource strain: Not on file  . Food  insecurity - worry: Not on file  . Food insecurity - inability: Not on file  . Transportation needs - medical: Not on file  . Transportation needs - non-medical: Not on file  Occupational History  . Occupation: Chief Executive Officer: [employer]    Comment: also at [employer]  . Smoking status: Former Smoker    Types: E-cigarettes  . Smokeless tobacco: Never Used  Substance and Sexual Activity  . Alcohol use: No    Alcohol/week: 0.0 oz    Frequency: Never    Comment: Socially  . Drug use: No  . Sexual activity: No    Partners: Male    Birth control/protection: None  Other Topics Concern  . Not on file  Social History Narrative   Pt lives at home with her mom, dad, and two brothers.   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 There is no height or weight on file to calculate BMI.  Generalized: Well developed, in no acute distress  Head: normocephalic and atraumatic,. Oropharynx benign  Neck: Supple, no carotid bruits  Cardiac: Regular rate rhythm, no murmur  Musculoskeletal: No deformity   Neurological examination   Mentation: Alert oriented to time, place, history taking. Attention span and concentration appropriate. Recent and remote memory intact.  Follows all commands speech and language fluent.   Cranial nerve II-XII: Fundoscopic exam reveals sharp disc margins.Pupils were equal round reactive to light extraocular movements were full, visual field were full on confrontational test. Facial sensation and strength were normal. hearing was intact to finger rubbing bilaterally. Uvula tongue midline. head turning and shoulder shrug were normal and symmetric.Tongue protrusion into cheek strength was normal. Motor: normal bulk and tone, full strength in the BUE, BLE, fine finger movements normal, no pronator drift. No focal weakness Sensory: normal  and symmetric to light touch, pinprick, and  Vibration, proprioception  Coordination: finger-nose-finger, heel-to-shin bilaterally, no dysmetria Reflexes: Brachioradialis 2/2, biceps 2/2, triceps 2/2, patellar 2/2, Achilles 2/2, plantar responses were flexor bilaterally. Gait and Station: Rising up from seated position without assistance, normal stance,  moderate stride, good arm swing, smooth turning, able to perform tiptoe, and heel walking without difficulty. Tandem gait is steady  DIAGNOSTIC DATA (LABS, IMAGING, TESTING) - I reviewed patient records, labs, notes, testing and imaging myself where available.  Lab Results  Component Value Date   WBC 11.5 (H) 03/07/2017   HGB 13.1 03/07/2017   HCT 38.3 03/07/2017   MCV 98.2 03/07/2017   PLT 320 03/07/2017      Component Value Date/Time   NA 140 11/06/2016 1800   NA 140 10/02/2016 1113   K 3.9 11/06/2016 1800   CL 107 11/06/2016 1800   CO2 23 11/06/2016 1800   GLUCOSE 106 (H) 11/06/2016 1800   BUN 13 11/06/2016 1800   BUN 17 10/02/2016 1113   CREATININE 0.68 11/06/2016 1800   CREATININE 0.69 02/21/2015 1111   CALCIUM 8.3 (L) 11/06/2016 1800   PROT 6.3 (L) 11/06/2016 1800   PROT 6.4 10/02/2016 1113   ALBUMIN 3.6 11/06/2016 1800   ALBUMIN 3.9 10/02/2016 1113   AST 23 11/06/2016 1800   ALT 12 (L) 11/06/2016 1800   ALKPHOS 60 11/06/2016 1800   BILITOT 0.4 11/06/2016 1800   BILITOT 0.4 10/02/2016 1113   GFRNONAA >60 11/06/2016 1800   GFRNONAA >89 02/21/2015 1111   GFRAA >60 11/06/2016 1800   GFRAA >89 02/21/2015 1111   Lab Results  Component Value Date   CHOL 132 02/21/2015   HDL 70 02/21/2015   LDLCALC 49 02/21/2015   TRIG 64 02/21/2015   CHOLHDL 1.9 02/21/2015   Lab Results  Component Value Date   HGBA1C 5.2 03/31/2014   Lab Results  Component Value Date   VITAMINB12 493 02/21/2015   Lab Results  Component Value Date   TSH 2.847 02/21/2015    ***  ASSESSMENT AND PLAN  27 y.o. year old female  has a past  medical history of Adult ADHD, Allergy, Anorexia, Anxiety and depression, Asthma, Endometriosis, H/O gastric bypass, Narcolepsy and cataplexy, and Pseudotumor cerebri (2011). here with ***  Treatment plan and additional workup : XYREM 4.5 gram twice at night.  Modafinil 200 mg  Lunesta 2 mg at night no longer needed, while on XYREM.  No longer on Topamax  or Diamox, no return of pseudotumour  Nilda Riggs, [facility], [facility], APRN  [facility] [address] [place], [place] [address] [phone]

## 2017-04-09 ENCOUNTER — Ambulatory Visit: Payer: BC Managed Care – PPO | Admitting: Nurse Practitioner

## 2017-04-10 ENCOUNTER — Encounter: Payer: Self-pay | Admitting: Nurse Practitioner

## 2017-05-02 ENCOUNTER — Inpatient Hospital Stay (HOSPITAL_COMMUNITY)
Admission: AD | Admit: 2017-05-02 | Discharge: 2017-05-02 | Disposition: A | Discharge status: Home / Self Care | Payer: BC Managed Care – PPO | Source: Ambulatory Visit | Attending: Obstetrics and Gynecology | Admitting: Obstetrics and Gynecology

## 2017-05-02 ENCOUNTER — Encounter (HOSPITAL_COMMUNITY): Payer: Self-pay | Admitting: *Deleted

## 2017-05-02 DIAGNOSIS — O26893 Other specified pregnancy related conditions, third trimester: Secondary | ICD-10-CM | POA: Diagnosis not present

## 2017-05-02 DIAGNOSIS — Z87891 Personal history of nicotine dependence: Secondary | ICD-10-CM | POA: Diagnosis not present

## 2017-05-02 DIAGNOSIS — K920 Hematemesis: Secondary | ICD-10-CM | POA: Diagnosis not present

## 2017-05-02 DIAGNOSIS — O9989 Other specified diseases and conditions complicating pregnancy, childbirth and the puerperium: Secondary | ICD-10-CM | POA: Diagnosis not present

## 2017-05-02 DIAGNOSIS — O212 Late vomiting of pregnancy: Secondary | ICD-10-CM | POA: Diagnosis present

## 2017-05-02 DIAGNOSIS — Z3A30 30 weeks gestation of pregnancy: Secondary | ICD-10-CM | POA: Insufficient documentation

## 2017-05-02 DIAGNOSIS — H43399 Other vitreous opacities, unspecified eye: Secondary | ICD-10-CM

## 2017-05-02 LAB — URINALYSIS, ROUTINE W REFLEX MICROSCOPIC
BILIRUBIN URINE: NEGATIVE
GLUCOSE, UA: NEGATIVE mg/dL
Hgb urine dipstick: NEGATIVE
KETONES UR: NEGATIVE mg/dL
Nitrite: NEGATIVE
PROTEIN: NEGATIVE mg/dL
Specific Gravity, Urine: 1.004 — ABNORMAL LOW (ref 1.005–1.030)
pH: 7 (ref 5.0–8.0)

## 2017-05-02 MED ORDER — PROMETHAZINE HCL 25 MG PO TABS: 25.0000 mg | ORAL_TABLET | Freq: Four times a day (QID) | ORAL | 0 refills | Status: DC | PRN

## 2017-05-02 MED ORDER — PANTOPRAZOLE SODIUM 20 MG PO TBEC: 20.0000 mg | DELAYED_RELEASE_TABLET | Freq: Every day | ORAL | 0 refills | Status: DC

## 2017-05-02 MED ORDER — PANTOPRAZOLE SODIUM 20 MG PO TBEC
20.0000 mg | DELAYED_RELEASE_TABLET | Freq: Once | ORAL | Status: AC
  Administered 2017-05-02: 20 mg via ORAL
  Filled 2017-05-02: qty 1

## 2017-05-02 NOTE — Discharge Instructions (Signed)
Eye Floaters Eye floaters are specks of material that float around inside your eye. A jelly-like fluid (vitreous) fills the inside of your eye. The vitreous is normally clear. It allows light to pass through to tissues at the back of the eye (retina). The retina contains the nerves needed for vision. Your vitreous can start to shrink and become stringy as you age. Strands of material may start to float around inside the eye. They come from clumps of cells, blood, or other materials. These objects cast shadows on the retina and show up as floaters. Floaters may be more obvious when you look up at the sky or at a bright, blank background. They do not go away completely. In time, however, they may settle below your line of sight. Floaters can be annoying. They do not usually cause vision problems. Sometimes floaters appear along with flashes. Flashes look like bright, quick streaks of light. They usually occur at the edge of your vision. Flashes result when your vitreous pulls on your retina. They also occur with age. However, they could be a warning sign of a detached retina. This is a serious condition that requires emergency treatment to prevent vision loss. What are the causes? For most people, eye floaters develop when the vitreous begins to shrink as a normal part of aging. More serious causes of floaters include:  A torn retina.  Injury.  Bleeding inside the eye. Diabetes and other conditions can cause broken retinal blood vessels.  A blood clot in the major vein of the retina or its branches (retinal vein occlusion).  Retinal detachment.  Vitreous detachment.  Inflammation inside the eye (uveitis).  Infection inside the eye.  What increases the risk? You may have a higher risk for floaters if:  You are older.  You are nearsighted.  You have diabetes.  You have had cataracts removed.  What are the signs or symptoms? Symptoms of floaters include seeing small, shadowy shapes move  across your vision. They move as your eyes move. They drift out of your vision when you keep your eyes still. These shapes may look like:  Specks.  Dots.  Circles.  Squiggly lines.  Thread.  Symptoms of flashes include seeing:  Bursts of light.  Flashing lights.  Lightning streaks.  What is commonly referred to as stars.  How is this diagnosed? Your health care provider may diagnose floaters and flashes based on your symptoms. You may need to see an eye care specialist (optometrist or ophthalmologist). The specialist will do an exam to determine whether your floaters are a normal part of aging or a warning sign of a more serious eye problem. The specialist may put drops in your eyes to open your pupils wide (dilate) and then use a special scope (slit lamp) to look inside your eye. How is this treated? No treatment is needed for floaters that occur normally with age. Sometimes floaters become severe enough to affect your vision. In rare cases, surgery to remove the vitreous and replace it with a saltwater solution (vitrectomy) may be considered. Follow these instructions at home: Keep all follow-up visits as directed by your health care provider. This is important. Contact a health care provider if:  You have a sudden increase in floaters.  You have floaters along with flashes.  You have floaters along with any new eye symptoms. Get help right away if:  You have a sudden increase in floaters or flashes that interferes with your vision.  Your vision suddenly changes. This  information is not intended to replace advice given to you by your health care provider. Make sure you discuss any questions you have with your health care provider. Document Released: 03/23/2003 Document Revised: 08/26/2015 Document Reviewed: 11/12/2013 Elsevier Interactive Patient Education  2018 [employer]  Nausea, Adult Nausea is the feeling of an upset stomach or having to vomit. Nausea on its  own is not usually a serious concern, but it may be an early sign of a more serious medical problem. As nausea gets worse, it can lead to vomiting. If vomiting develops, or if you are not able to drink enough fluids, you are at risk of becoming dehydrated. Dehydration can make you tired and thirsty, cause you to have a dry mouth, and decrease how often you urinate. Older adults and people with other diseases or a weak immune system are at higher risk for dehydration. The main goals of treating your nausea are:  To limit repeated nausea episodes.  To prevent vomiting and dehydration.  Follow these instructions at home: Follow instructions from your health care provider about how to care for yourself at home. Eating and drinking Follow these recommendations as told by your health care provider:  Take an oral rehydration solution (ORS). This is a drink that is sold at pharmacies and retail stores.  Drink clear fluids in small amounts as you are able. Clear fluids include water, ice chips, diluted fruit juice, and low-calorie sports drinks.  Eat bland, easy-to-digest foods in small amounts as you are able. These foods include bananas, applesauce, rice, lean meats, toast, and crackers.  Avoid drinking fluids that contain a lot of sugar or caffeine, such as energy drinks, sports drinks, and soda.  Avoid alcohol.  Avoid spicy or fatty foods.  General instructions  Drink enough fluid to keep your urine clear or pale yellow.  Wash your hands often. If soap and water are not available, use hand sanitizer.  Make sure that all people in your household wash their hands well and often.  Rest at home while you recover.  Take over-the-counter and prescription medicines only as told by your health care provider.  Breathe slowly and deeply when you feel nauseous.  Watch your condition for any changes.  Keep all follow-up visits as told by your health care provider. This is important. Contact a  health care provider if:  You have a headache.  You have new symptoms.  Your nausea gets worse.  You have a fever.  You feel light-headed or dizzy.  You vomit.  You cannot keep fluids down. Get help right away if:  You have pain in your chest, neck, arm, or jaw.  You feel extremely weak or you faint.  You have vomit that is bright red or looks like coffee grounds.  You have bloody or black stools or stools that look like tar.  You have a severe headache, a stiff neck, or both.  You have severe pain, cramping, or bloating in your abdomen.  You have a rash.  You have difficulty breathing or are breathing very quickly.  Your heart is beating very quickly.  Your skin feels cold and clammy.  You feel confused.  You have pain when you urinate.  You have signs of dehydration, such as: ? Dark urine, very little, or no urine. ? Cracked lips. ? Dry mouth. ? Sunken eyes. ? Sleepiness. ? Weakness. These symptoms may represent a serious problem that is an emergency. Do not wait to see if the symptoms will  go away. Get medical help right away. Call your local emergency services (911 in the [place] Do not drive yourself to the hospital. This information is not intended to replace advice given to you by your health care provider. Make sure you discuss any questions you have with your health care provider. Document Released: 04/27/2004 Document Revised: 08/23/2015 Document Reviewed: 11/24/2014 Elsevier Interactive Patient Education  2018 [employer]. Hematemesis Hematemesis is when you vomit blood. It is a sign of bleeding in the upper part of your digestive tract. This is also called your gastrointestinal (GI) tract. Your upper GI tract includes your mouth, throat, esophagus, stomach, and the first part of your small intestine (duodenum). Hematemesis is usually caused by bleeding from your esophagus or stomach. You may suddenly vomit bright red blood. You might also vomit old  blood. It may look like coffee grounds. You may also have other symptoms, such as:  Stomach pain.  Heartburn.  Black and tarry stool.  Follow these instructions at home: Watch your hematemesis for any changes. The following actions may help to lessen any discomfort you are feeling:  Take medicines only as directed by your health care provider. Do not take aspirin, ibuprofen, or any other anti-inflammatory medicine without approval from your health care provider.  Rest as needed.  Drink small sips of clear liquids often, as long as you can keep them down. Try to drink enough fluids to keep your urine clear or pale yellow.  Do not drink alcohol.  Do not use any tobacco products, including cigarettes, chewing tobacco, or electronic cigarettes. If you need help quitting, ask your health care provider.  Keep all follow-up visits as directed by your health care provider. This is important.  Contact a health care provider if:  The vomiting of blood worsens, or begins again after it has stopped.  You have persistent stomach pain.  You have nausea, indigestion, or heartburn.  You feel weak or dizzy. Get help right away if:  You faint or feel extremely weak.  You have a rapid heartbeat.  You are urinating less than normal or not at all.  You have persistent vomiting.  You vomit large amounts of bloody or dark material.  You vomit bright red blood.  You pass large, dark, or bloody stools.  You have chest pain or trouble breathing. This information is not intended to replace advice given to you by your health care provider. Make sure you discuss any questions you have with your health care provider. Document Released: 04/27/2004 Document Revised: 08/26/2015 Document Reviewed: 11/12/2013 Elsevier Interactive Patient Education  [employer].

## 2017-05-02 NOTE — MAU Provider Note (Signed)
Chief Complaint:  Emesis (pt states she had blood in vomit. )   First Provider Initiated Contact with Patient 05/02/17 0340     HPI: Sarah Estes is a 27 y.o. G1P0 at 20w2dwho presents to maternity admissions reporting vomiting twice.  On the way to bathroom, noted some floaters/flashes in vision which resolved.  First time vomited food with streak of blood.  Second time vomited more "chunks with more blood". She reports good fetal movement, denies LOF, vaginal bleeding, vaginal itching/burning, urinary symptoms, h/a, dizziness,  diarrhea, constipation or fever/chills.    Has a history of narcolepsy/cataplexy, pseudotumor cerebri.  Followed by Neurology and Opthamology. Also has hx of anorexia and gastric bypass.   Emesis   This is a new problem. The current episode started today. The problem occurs 2 to 4 times per day. There has been no fever. Pertinent negatives include no abdominal pain, chest pain, chills, coughing, diarrhea, dizziness, fever, headaches or myalgias. She has tried nothing for the symptoms.    RN NOte: Pt presents to MAU c/o floaters in her vision. Pt denies any issues with BP. Pt states she has vomited blood tonight. Pt states the on call nurse told her to come be evaluated   Past Medical History: Past Medical History:  Diagnosis Date  . Adult ADHD   . Allergy   . Anorexia   . Anxiety and depression   . Asthma   . Endometriosis   . H/O gastric bypass   . Narcolepsy and cataplexy   . Pseudotumor cerebri 2011    Past obstetric history: OB History  Gravida Para Term Preterm AB Living  1            SAB TAB Ectopic Multiple Live Births               # Outcome Date GA Lbr Len/2nd Weight Sex Delivery Anes PTL Lv  1 Current               Past Surgical History: Past Surgical History:  Procedure Laterality Date  . COLONOSCOPY    . COSMETIC SURGERY    . GASTRIC BYPASS    . LAPAROSCOPIC ENDOMETRIOSIS FULGURATION    . LUMBAR PUNCTURE      Family  History: Family History  Problem Relation Age of Onset  . Heart disease Maternal Grandfather   . Hypertension Maternal Grandfather   . Stroke Maternal Grandfather   . Diabetes Father   . Hypertension Father   . Hypertension Paternal Grandmother   . Mental retardation Paternal Grandmother   . Diabetes Paternal Grandfather   . Hyperlipidemia Paternal Grandfather   . Hypertension Paternal Grandfather     Social History: Social History   Tobacco Use  . Smoking status: Former Smoker    Types: E-cigarettes  . Smokeless tobacco: Never Used  Substance Use Topics  . Alcohol use: No    Alcohol/week: 0.0 oz    Frequency: Never    Comment: Socially  . Drug use: No    Allergies:  Allergies  Allergen Reactions  . Corticosteroids Other (See Comments)    Injections cannot take due to gastric surgery (creams or tablets?)  . Gluten Meal Nausea Only and Other (See Comments)    Exacerbates endometriosis symptoms    . Nsaids Other (See Comments)    Had gastric bypass surgery   . Sulfa Antibiotics Hives, Itching and Rash  . Morphine And Related Itching    Meds:  Medications Prior to Admission  Medication Sig Dispense  Refill Last Dose  . diphenhydrAMINE (BENADRYL) 25 MG tablet Take 25 mg by mouth every 6 (six) hours as needed for allergies.   Past Month at Unknown time  . doxylamine, Sleep, (UNISOM) 25 MG tablet Take 25 mg by mouth 2 (two) times daily as needed for sleep.    05/01/2017 at Unknown time  . Prenatal Vit-Fe Fumarate-FA (PRENATAL MULTIVITAMIN) TABS tablet Take 1 tablet by mouth daily at 12 noon.   05/01/2017 at Unknown time    I have reviewed patient's Past Medical Hx, Surgical Hx, Family Hx, Social Hx, medications and allergies.   ROS:  Review of Systems  Constitutional: Negative for chills and fever.  Respiratory: Negative for cough.   Cardiovascular: Negative for chest pain.  Gastrointestinal: Positive for vomiting. Negative for abdominal pain and diarrhea.   Musculoskeletal: Negative for myalgias.  Neurological: Negative for dizziness and headaches.   Other systems negative  Physical Exam   Patient Vitals for the past 24 hrs:  BP Pulse Height Weight  05/02/17 0324 120/71 79 5\' 9"  (1.753 m) 237 lb 0.6 oz (107.5 kg)   Constitutional: Well-developed, well-nourished female in no acute distress.  Cardiovascular: normal rate and rhythm Respiratory: normal effort, clear to auscultation bilaterally GI: Abd soft, non-tender, gravid appropriate for gestational age.   No rebound or guarding. MS: Extremities nontender, no edema, normal ROM Neurologic: Alert and oriented x 4.  GU: Neg CVAT.  PELVIC EXAM:  deferred  FHT:  Baseline 140 , moderate variability, accelerations present, no decelerations Contractions: Rare   Labs: No results found for this or any previous visit (from the past 24 hour(s)). --/--/A POS, A POS (09/29 2235)  Imaging:  No results found.  MAU Course/MDM: I have ordered labs and reviewed results. UA showed no dehydration. NST reviewed and found reactive Consult Dr Renaldo FiddlerAdkins with presentation, exam findings and test results.  Treatments in MAU included one dose of Protonix Discussed recommendation of Dr Renaldo FiddlerAdkins to start Protonix and home and if bleeding recurs, may need referral to GI   Will give Rx for promethazine for nausea and Protonix    Assessment: 1. Hematemesis with nausea   2. Vitreous floaters, unspecified laterality     Plan: Discharge home Rx Protonix for hematemesis Rx Promethazine for nausea Preterm Labor precautions and fetal kick counts Follow up in Office for prenatal visits and recheck of status Encouraged to return here or to other Urgent Care/ED if she develops worsening of symptoms, increase in pain, fever, or other concerning symptoms.     Follow-up Information    Zelphia CairoAdkins, Gretchen, MD. Schedule an appointment as soon as possible for a visit.   Specialty:  Obstetrics and Gynecology Contact  information: [address], [address] [place] [address] [phone]           Pt stable at time of discharge.  Wynelle BourgeoisMarie Alyssha Housh CNM, MSN Certified Nurse-Midwife 05/02/2017 4:19 AM

## 2017-05-02 NOTE — MAU Note (Signed)
Pt presents to MAU c/o floaters in her vision. Pt denies any issues with BP. Pt states she has vomited blood tonight. Pt states the on call nurse told her to come be evaluated.

## 2017-05-09 ENCOUNTER — Inpatient Hospital Stay (HOSPITAL_COMMUNITY)
Admission: AD | Admit: 2017-05-09 | Discharge: 2017-05-09 | Disposition: A | Discharge status: Home / Self Care | Payer: BC Managed Care – PPO | Source: Ambulatory Visit | Attending: Obstetrics and Gynecology | Admitting: Obstetrics and Gynecology

## 2017-05-09 DIAGNOSIS — O1203 Gestational edema, third trimester: Secondary | ICD-10-CM | POA: Insufficient documentation

## 2017-05-09 DIAGNOSIS — O26893 Other specified pregnancy related conditions, third trimester: Secondary | ICD-10-CM | POA: Insufficient documentation

## 2017-05-09 DIAGNOSIS — R0602 Shortness of breath: Secondary | ICD-10-CM | POA: Insufficient documentation

## 2017-05-09 DIAGNOSIS — Z711 Person with feared health complaint in whom no diagnosis is made: Secondary | ICD-10-CM

## 2017-05-09 DIAGNOSIS — Z3A31 31 weeks gestation of pregnancy: Secondary | ICD-10-CM | POA: Diagnosis not present

## 2017-05-09 NOTE — MAU Note (Signed)
Pt assessed by provider in triage; discharge instructions given. Will follow up in office as scheduled.

## 2017-05-09 NOTE — MAU Provider Note (Signed)
S:   Ms.Obera Iran OuchStrader is a 27 y.o. female G1P0 @ 5924w2d here in MAU with multiple complaints. States she has had increased swelling for the last week. States she was concerned about her BP so she went to [facility] and [facility]. [facility] 131/82. States her BP is normally 100/60. She states she has occasional SOB, worse when she is up moving around. She denies SOB now. Patient states she is concerned about the wait time and does not feel like she needs to stay.   O:  GENERAL: Well-developed, well-nourished female in no acute distress.  LUNGS: Effort normal SKIN: Warm, dry and without erythema ABDOMEN: soft non tender PSYCH: Normal mood and affect Non pitting lower extremity edema.   Vitals:   05/09/17 1950  BP: 120/76  Pulse: 89  Resp: 18  Temp: 98 F (36.7 C)  SpO2: 100%    MDM:  Discussed patient with Dr. Elon SpannerLeger. Medical screen done. Patient conversing without signs of distress. Patient feeling much better after knowing her BP is normal. + fetal movement. No bleeding. + heart tones via doppler tonight.   A:  1. Physically well but worried   2. Edema during pregnancy in third trimester     P:  Discharge home in stable condition Apologized for the wait times Encouraged patient to return to MAU if symptoms worsen  Ahmet Schank, Harolyn RutherfordJennifer I, NP 05/09/2017 8:31 PM

## 2017-05-09 NOTE — Discharge Instructions (Signed)

## 2017-05-09 NOTE — MAU Note (Signed)
Pt here with c/o swelling in hands and feet that has worsened the last couple of days. Feel short of breath sometimes during the day, off and on. [facility] and it was 131/82 there and "it's usually more like 100/60 so I was concerned."

## 2017-05-31 ENCOUNTER — Other Ambulatory Visit: Payer: Self-pay | Admitting: Advanced Practice Midwife

## 2017-06-10 ENCOUNTER — Inpatient Hospital Stay (HOSPITAL_COMMUNITY)
Admission: AD | Admit: 2017-06-10 | Discharge: 2017-06-10 | Disposition: A | Discharge status: Home / Self Care | Payer: BC Managed Care – PPO | Source: Ambulatory Visit | Attending: Obstetrics & Gynecology | Admitting: Obstetrics & Gynecology

## 2017-06-10 ENCOUNTER — Encounter (HOSPITAL_COMMUNITY): Payer: Self-pay | Admitting: *Deleted

## 2017-06-10 DIAGNOSIS — O368130 Decreased fetal movements, third trimester, not applicable or unspecified: Secondary | ICD-10-CM | POA: Diagnosis not present

## 2017-06-10 DIAGNOSIS — R109 Unspecified abdominal pain: Secondary | ICD-10-CM | POA: Insufficient documentation

## 2017-06-10 DIAGNOSIS — O26893 Other specified pregnancy related conditions, third trimester: Secondary | ICD-10-CM | POA: Diagnosis present

## 2017-06-10 DIAGNOSIS — Y92009 Unspecified place in unspecified non-institutional (private) residence as the place of occurrence of the external cause: Secondary | ICD-10-CM

## 2017-06-10 DIAGNOSIS — W19XXXA Unspecified fall, initial encounter: Secondary | ICD-10-CM | POA: Diagnosis not present

## 2017-06-10 DIAGNOSIS — Z3689 Encounter for other specified antenatal screening: Secondary | ICD-10-CM

## 2017-06-10 DIAGNOSIS — Z3A35 35 weeks gestation of pregnancy: Secondary | ICD-10-CM

## 2017-06-10 NOTE — MAU Provider Note (Signed)
History     CSN: [id number]  Arrival date and time: 06/10/17 1339   First Provider Initiated Contact with Patient 06/10/17 1359      Chief Complaint  Patient presents with  . Fall   HPI Ms. Sarah FrameStephanie Estes is a 27 y.o. G1P0 at 5425w3d who presents to MAU today with complaint of a fall last night around 2100. Patient slipped in the mud and caught herself before hitting the ground. She denies any direct abdominal trauma. She denies vaginal bleeding or LOF. She has had lower abdominal pain since the fall. She had occasional contractions last night which is unchanged from previously. She states decreased FM today.   OB History    Gravida Para Term Preterm AB Living   1             SAB TAB Ectopic Multiple Live Births                  Past Medical History:  Diagnosis Date  . Adult ADHD   . Allergy   . Anorexia   . Anxiety and depression   . Asthma   . Endometriosis   . H/O gastric bypass   . Narcolepsy and cataplexy   . Pseudotumor cerebri 2011    Past Surgical History:  Procedure Laterality Date  . COLONOSCOPY    . COSMETIC SURGERY    . GASTRIC BYPASS    . LAPAROSCOPIC ENDOMETRIOSIS FULGURATION    . LUMBAR PUNCTURE      Family History  Problem Relation Age of Onset  . Heart disease Maternal Grandfather   . Hypertension Maternal Grandfather   . Stroke Maternal Grandfather   . Diabetes Father   . Hypertension Father   . Hypertension Paternal Grandmother   . Mental retardation Paternal Grandmother   . Diabetes Paternal Grandfather   . Hyperlipidemia Paternal Grandfather   . Hypertension Paternal Grandfather     Social History   Tobacco Use  . Smoking status: Former Smoker    Types: E-cigarettes  . Smokeless tobacco: Never Used  Substance Use Topics  . Alcohol use: No    Alcohol/week: 0.0 oz    Frequency: Never    Comment: Socially  . Drug use: No    Allergies:  Allergies  Allergen Reactions  . Corticosteroids Other (See Comments)    Injections  cannot take due to gastric surgery (creams or tablets?)  . Gluten Meal Nausea Only and Other (See Comments)    Exacerbates endometriosis symptoms    . Nsaids Other (See Comments)    Had gastric bypass surgery   . Sulfa Antibiotics Hives, Itching and Rash  . Morphine And Related Itching    No medications prior to admission.    Review of Systems  Constitutional: Negative for fever.  Gastrointestinal: Positive for abdominal pain.  Genitourinary: Negative for vaginal bleeding and vaginal discharge.   Physical Exam   Blood pressure 122/81, pulse 87, temperature 97.8 F (36.6 C), temperature source Oral, resp. rate 20, height 5\' 9"  (1.753 m), weight 246 lb (111.6 kg), last menstrual period 09/07/2016.  Physical Exam  Nursing note and vitals reviewed. Constitutional: She is oriented to person, place, and time. She appears well-developed and well-nourished. No distress.  HENT:  Head: Normocephalic and atraumatic.  Cardiovascular: Normal rate.  Respiratory: Effort normal.  GI: Soft. She exhibits no distension and no mass. There is no tenderness. There is no rebound and no guarding.  Neurological: She is alert and oriented to person, place, and time.  Skin: Skin is warm and dry. No erythema.  Psychiatric: She has a normal mood and affect.    Fetal Monitoring: Baseline: 130 bpm Variability: moderate Accelerations: 15 x 15 Decelerations: none Contractions: none  MAU Course  Procedures None  MDM Discussed patient with Dr. Langston Masker. Reactive NST, no bleeding or direct abdominal trauma. Ok for discharge at this time. Follow-up in the office as scheduled. Tylenol PRN for pain.   Assessment and Plan  A: [facility] at [redacted]w[redacted]d Fall   P:  Discharge home Tylenol PRN for pain  Preterm labor precautions discussed Patient advised to follow-up with [facility] as scheduled or sooner PRN Patient may return to MAU as needed or if her condition were to change or worsen  Vonzella Nipple,  PA-C 06/10/2017, 3:11 PM

## 2017-06-10 NOTE — MAU Note (Signed)
Urine sent to lab 

## 2017-06-10 NOTE — MAU Note (Signed)
Pt slipped in mud & fell around 2100 last night, doesn't think she hit her abdomen.  Baby moved normally after fall last night, is much more decreased today.  C/O lower abdominal throbbing,  Occasional uc's,  denies bleeding.

## 2017-06-10 NOTE — Discharge Instructions (Signed)
Preventing Injuries During Pregnancy Injuries can happen during pregnancy. Minor falls and accidents usually do not harm you or your baby. But some injuries can harm you and your baby. Tell your doctor about any injury you suffer. What can I do to avoid injuries? Safety  Remove rugs and loose objects on the floor.  Wear comfortable shoes that have a good grip. Do not wear shoes that have high heels.  Always wear your seat belt in the car. The lap belt should be below your belly. Always drive safely.  Do not ride on a motorcycle. Activity  Do not take part in rough and violent activities or sports.  Avoid: ? Walking on wet or slippery floors. ? Lifting heavy pots of boiling or hot liquids. ? Fixing electrical problems. ? Being near fires. General instructions  Take over-the-counter and prescription medicines only as told by your doctor.  Know your blood type and the blood type of the baby's father.  If you are a victim of domestic violence: ? Call your local emergency services (911 in the [place] ? Contact the [employer] Violence Hotline for help and support. Get help right away if:  You fall on your belly or receive any serious blow to your belly.  You have a stiff neck or neck pain after a fall or an injury.  You get a headache or have problems with vision after an injury.  You do not feel the baby move or the baby is not moving as much as normal.  You have been a victim of domestic violence or any other kind of attack.  You have been in a car accident.  You have bleeding from your vagina.  Fluid is leaking from your vagina.  You start to have cramping or pain in your belly (contractions).  You have very bad pain in your lower back.  You feel weak or pass out (faint).  You start to throw up (vomit) after an injury.  You have been burned. Summary  Some injuries that happen during pregnancy can do harm to the baby.  Tell your doctor about any  injury.  Take steps to avoid injury. This includes removing rugs and loose objects on the floor. Always wear your seat belt in the car.  Do not take part in rough and violent activities or sports.  Get help right away if you have any serious accident or injury. This information is not intended to replace advice given to you by your health care provider. Make sure you discuss any questions you have with your health care provider. Document Released: 04/22/2010 Document Revised: 03/29/2016 Document Reviewed: 03/29/2016 Elsevier Interactive Patient Education  2017 [employer]. Fetal Movement Counts Patient Name: ________________________________________________ Patient Due Date: ____________________ What is a fetal movement count? A fetal movement count is the number of times that you feel your baby move during a certain amount of time. This may also be called a fetal kick count. A fetal movement count is recommended for every pregnant woman. You may be asked to start counting fetal movements as early as week 28 of your pregnancy. Pay attention to when your baby is most active. You may notice your baby's sleep and wake cycles. You may also notice things that make your baby move more. You should do a fetal movement count:  When your baby is normally most active.  At the same time each day.  A good time to count movements is while you are resting, after having something to eat  and drink. How do I count fetal movements? 1. Find a quiet, comfortable area. Sit, or lie down on your side. 2. Write down the date, the start time and stop time, and the number of movements that you felt between those two times. Take this information with you to your health care visits. 3. For 2 hours, count kicks, flutters, swishes, rolls, and jabs. You should feel at least 10 movements during 2 hours. 4. You may stop counting after you have felt 10 movements. 5. If you do not feel 10 movements in 2 hours, have something  to eat and drink. Then, keep resting and counting for 1 hour. If you feel at least 4 movements during that hour, you may stop counting. Contact a health care provider if:  You feel fewer than 4 movements in 2 hours.  Your baby is not moving like he or she usually does. Date: ____________ Start time: ____________ Stop time: ____________ Movements: ____________ Date: ____________ Start time: ____________ Stop time: ____________ Movements: ____________ Date: ____________ Start time: ____________ Stop time: ____________ Movements: ____________ Date: ____________ Start time: ____________ Stop time: ____________ Movements: ____________ Date: ____________ Start time: ____________ Stop time: ____________ Movements: ____________ Date: ____________ Start time: ____________ Stop time: ____________ Movements: ____________ Date: ____________ Start time: ____________ Stop time: ____________ Movements: ____________ Date: ____________ Start time: ____________ Stop time: ____________ Movements: ____________ Date: ____________ Start time: ____________ Stop time: ____________ Movements: ____________ This information is not intended to replace advice given to you by your health care provider. Make sure you discuss any questions you have with your health care provider. Document Released: 04/19/2006 Document Revised: 11/17/2015 Document Reviewed: 04/29/2015 Elsevier Interactive Patient Education  2018 [employer] of the uterus can occur throughout pregnancy, but they are not always a sign that you are in labor. You may have practice contractions called Braxton Hicks contractions. These false labor contractions are sometimes confused with true labor. What are Deberah Pelton contractions? Braxton Hicks contractions are tightening movements that occur in the muscles of the uterus before labor. Unlike true labor contractions, these contractions do not result in opening (dilation)  and thinning of the cervix. Toward the end of pregnancy (32-34 weeks), Braxton Hicks contractions can happen more often and may become stronger. These contractions are sometimes difficult to tell apart from true labor because they can be very uncomfortable. You should not feel embarrassed if you go to the hospital with false labor. Sometimes, the only way to tell if you are in true labor is for your health care provider to look for changes in the cervix. The health care provider will do a physical exam and may monitor your contractions. If you are not in true labor, the exam should show that your cervix is not dilating and your water has not broken. If there are other health problems associated with your pregnancy, it is completely safe for you to be sent home with false labor. You may continue to have Braxton Hicks contractions until you go into true labor. How to tell the difference between true labor and false labor True labor  Contractions last 30-70 seconds.  Contractions become very regular.  Discomfort is usually felt in the top of the uterus, and it spreads to the lower abdomen and low back.  Contractions do not go away with walking.  Contractions usually become more intense and increase in frequency.  The cervix dilates and gets thinner. False labor  Contractions are usually shorter and not as strong as  true labor contractions.  Contractions are usually irregular.  Contractions are often felt in the front of the lower abdomen and in the groin.  Contractions may go away when you walk around or change positions while lying down.  Contractions get weaker and are shorter-lasting as time goes on.  The cervix usually does not dilate or become thin. Follow these instructions at home:  Take over-the-counter and prescription medicines only as told by your health care provider.  Keep up with your usual exercises and follow other instructions from your health care provider.  Eat and  drink lightly if you think you are going into labor.  If Braxton Hicks contractions are making you uncomfortable: ? Change your position from lying down or resting to walking, or change from walking to resting. ? Sit and rest in a tub of warm water. ? Drink enough fluid to keep your urine pale yellow. Dehydration may cause these contractions. ? Do slow and deep breathing several times an hour.  Keep all follow-up prenatal visits as told by your health care provider. This is important. Contact a health care provider if:  You have a fever.  You have continuous pain in your abdomen. Get help right away if:  Your contractions become stronger, more regular, and closer together.  You have fluid leaking or gushing from your vagina.  You pass blood-tinged mucus (bloody show).  You have bleeding from your vagina.  You have low back pain that you never had before.  You feel your babys head pushing down and causing pelvic pressure.  Your baby is not moving inside you as much as it used to. Summary  Contractions that occur before labor are called Braxton Hicks contractions, false labor, or practice contractions.  Braxton Hicks contractions are usually shorter, weaker, farther apart, and less regular than true labor contractions. True labor contractions usually become progressively stronger and regular and they become more frequent.  Manage discomfort from [facility] contractions by changing position, resting in a warm bath, drinking plenty of water, or practicing deep breathing. This information is not intended to replace advice given to you by your health care provider. Make sure you discuss any questions you have with your health care provider. Document Released: 08/03/2016 Document Revised: 08/03/2016 Document Reviewed: 08/03/2016 Elsevier Interactive Patient Education  2018 [employer]

## 2017-06-27 ENCOUNTER — Inpatient Hospital Stay (HOSPITAL_COMMUNITY)
Admission: AD | Admit: 2017-06-27 | Discharge: 2017-06-27 | Disposition: A | Discharge status: Home / Self Care | Payer: BC Managed Care – PPO | Source: Ambulatory Visit | Attending: Obstetrics & Gynecology | Admitting: Obstetrics & Gynecology

## 2017-06-27 ENCOUNTER — Encounter (HOSPITAL_COMMUNITY): Payer: Self-pay | Admitting: *Deleted

## 2017-06-27 DIAGNOSIS — O9989 Other specified diseases and conditions complicating pregnancy, childbirth and the puerperium: Secondary | ICD-10-CM | POA: Diagnosis not present

## 2017-06-27 DIAGNOSIS — Y9301 Activity, walking, marching and hiking: Secondary | ICD-10-CM | POA: Diagnosis not present

## 2017-06-27 DIAGNOSIS — W19XXXA Unspecified fall, initial encounter: Secondary | ICD-10-CM | POA: Diagnosis not present

## 2017-06-27 DIAGNOSIS — W109XXA Fall (on) (from) unspecified stairs and steps, initial encounter: Secondary | ICD-10-CM | POA: Insufficient documentation

## 2017-06-27 DIAGNOSIS — Z3A37 37 weeks gestation of pregnancy: Secondary | ICD-10-CM | POA: Diagnosis not present

## 2017-06-27 DIAGNOSIS — O368130 Decreased fetal movements, third trimester, not applicable or unspecified: Secondary | ICD-10-CM | POA: Insufficient documentation

## 2017-06-27 DIAGNOSIS — Z885 Allergy status to narcotic agent status: Secondary | ICD-10-CM | POA: Insufficient documentation

## 2017-06-27 DIAGNOSIS — Z87891 Personal history of nicotine dependence: Secondary | ICD-10-CM | POA: Diagnosis not present

## 2017-06-27 HISTORY — DX: Unspecified abnormal cytological findings in specimens from vagina: R87.629

## 2017-06-27 HISTORY — DX: Unspecified infectious disease: B99.9

## 2017-06-27 LAB — URINALYSIS, ROUTINE W REFLEX MICROSCOPIC
Bacteria, UA: NONE SEEN
Bilirubin Urine: NEGATIVE
GLUCOSE, UA: 150 mg/dL — AB
Hgb urine dipstick: NEGATIVE
KETONES UR: NEGATIVE mg/dL
Nitrite: NEGATIVE
PH: 6 (ref 5.0–8.0)
Protein, ur: NEGATIVE mg/dL
SPECIFIC GRAVITY, URINE: 1.004 — AB (ref 1.005–1.030)

## 2017-06-27 LAB — FIBRINOGEN: Fibrinogen: 517 mg/dL — ABNORMAL HIGH (ref 210–475)

## 2017-06-27 MED ORDER — ACETAMINOPHEN 500 MG PO TABS
1000.0000 mg | ORAL_TABLET | Freq: Once | ORAL | Status: AC
  Administered 2017-06-27: 1000 mg via ORAL
  Filled 2017-06-27: qty 2

## 2017-06-27 NOTE — MAU Note (Signed)
Pt reports she fell down the stairs at 0130, states it was 2 steps and she fell on her butt and scooted. Denies bleeding , decreased fetal movement

## 2017-06-27 NOTE — MAU Provider Note (Signed)
History     CSN: [id number]  Arrival date and time: 06/27/17 1007   First Provider Initiated Contact with Patient 06/27/17 1111      Chief Complaint  Patient presents with  . Fall  . Decreased Fetal Movement   Sarah Estes is a 27 y.o. G1P0 at [id number] who presents today after a fall. She reports that she fell while going down stairs around 0100 today. She landed on her buttocks. She denies any VB, LOF or contractions. She has had occasional BH contraction off and on since prior to the fall. She reports that she has not felt the baby move as much since the fall. She reports that she is still not feeling the baby move as much even while here on the monitor. Hx of cataplexy and narcolepsy, and has a few incidences of this during the pregancy, but she has not had any falls associated with either.   Fall  The accident occurred 12 to 24 hours ago. The fall occurred while walking. She fell from a height of 3 to 5 ft. She landed on hard floor. There was no blood loss. The point of impact was the buttocks. The pain is present in the back. The pain is at a severity of 4/10. Pertinent negatives include no abdominal pain. She has tried acetaminophen for the symptoms. The treatment provided mild relief.   Past Medical History:  Diagnosis Date  . Adult ADHD   . Allergy   . Anorexia   . Anxiety and depression   . Asthma    as child  . Depression    doing fine now  . Endometriosis   . H/O gastric bypass   . Infection    UTI  . Narcolepsy and cataplexy   . Pseudotumor cerebri 2011  . Vaginal Pap smear, abnormal     Past Surgical History:  Procedure Laterality Date  . COLONOSCOPY    . COLPOSCOPY    . COSMETIC SURGERY    . GASTRIC BYPASS    . LAPAROSCOPIC ENDOMETRIOSIS FULGURATION    . LUMBAR PUNCTURE      Family History  Problem Relation Age of Onset  . Heart disease Maternal Grandfather   . Hypertension Maternal Grandfather   . Stroke Maternal Grandfather   . Diabetes Father    . Hypertension Father   . Hypertension Paternal Grandmother   . Mental retardation Paternal Grandmother   . Diabetes Paternal Grandfather   . Hyperlipidemia Paternal Grandfather   . Hypertension Paternal Grandfather     Social History   Tobacco Use  . Smoking status: Former Smoker    Types: E-cigarettes  . Smokeless tobacco: Never Used  . Tobacco comment: Johnnye Lanaqui t Aug 2018  Substance Use Topics  . Alcohol use: No    Alcohol/week: 0.0 oz    Frequency: Never    Comment: Socially  . Drug use: No    Allergies:  Allergies  Allergen Reactions  . Corticosteroids Other (See Comments)    Injections cannot take due to gastric surgery (creams or tablets?)  . Gluten Meal Nausea Only and Other (See Comments)    Exacerbates endometriosis symptoms    . Nsaids Other (See Comments)    Had gastric bypass surgery   . Sulfa Antibiotics Hives, Itching and Rash  . Morphine And Related Itching    Medications Prior to Admission  Medication Sig Dispense Refill Last Dose  . diphenhydrAMINE (BENADRYL) 25 MG tablet Take 25 mg by mouth every 6 (six) hours as needed  for allergies.   Past Month at Unknown time  . doxylamine, Sleep, (UNISOM) 25 MG tablet Take 25 mg by mouth 2 (two) times daily as needed for sleep.    05/01/2017 at Unknown time  . pantoprazole (PROTONIX) 20 MG tablet TAKE 1 TABLET BY MOUTH EVERY DAY 30 tablet 0   . Prenatal Vit-Fe Fumarate-FA (PRENATAL MULTIVITAMIN) TABS tablet Take 1 tablet by mouth daily at 12 noon.   05/01/2017 at Unknown time  . promethazine (PHENERGAN) 25 MG tablet Take 1 tablet (25 mg total) by mouth every 6 (six) hours as needed for nausea or vomiting. 30 tablet 0     Review of Systems  Gastrointestinal: Negative for abdominal pain.  Musculoskeletal: Positive for back pain.  Neurological: Negative for dizziness.   Physical Exam   Blood pressure 119/77, pulse 93, temperature 98.6 F (37 C), temperature source Oral, resp. rate 16, height 5\' 9"  (1.753 m), weight  113.4 kg (250 lb), last menstrual period 09/07/2016, SpO2 100 %.  Physical Exam  Nursing note and vitals reviewed. Constitutional: She is oriented to person, place, and time. She appears well-developed and well-nourished. No distress.  HENT:  Head: Normocephalic.  Cardiovascular: Normal rate.  Respiratory: Effort normal.  GI: Soft. There is no tenderness. There is no rebound.  Musculoskeletal: Normal range of motion. She exhibits no tenderness or deformity.  Neurological: She is alert and oriented to person, place, and time.  Skin: Skin is warm and dry.  Psychiatric: She has a normal mood and affect.   FHT: 130, moderate with 15x15 accels, no decels Toco: one UC, and possibly another    Results for orders placed or performed during the hospital encounter of 06/27/17 (from the past 24 hour(s))  Urinalysis, Routine w reflex microscopic     Status: Abnormal   Collection Time: 06/27/17 10:47 AM  Result Value Ref Range   Color, Urine STRAW (A) YELLOW   APPearance CLEAR CLEAR   Specific Gravity, Urine 1.004 (L) 1.005 - 1.030   pH 6.0 5.0 - 8.0   Glucose, UA 150 (A) NEGATIVE mg/dL   Hgb urine dipstick NEGATIVE NEGATIVE   Bilirubin Urine NEGATIVE NEGATIVE   Ketones, ur NEGATIVE NEGATIVE mg/dL   Protein, ur NEGATIVE NEGATIVE mg/dL   Nitrite NEGATIVE NEGATIVE   Leukocytes, UA MODERATE (A) NEGATIVE   RBC / HPF 0-5 0 - 5 RBC/hpf   WBC, UA 6-30 0 - 5 WBC/hpf   Bacteria, UA NONE SEEN NONE SEEN   Squamous Epithelial / LPF 0-5 (A) NONE SEEN   Mucus PRESENT   Fibrinogen     Status: Abnormal   Collection Time: 06/27/17 12:06 PM  Result Value Ref Range   Fibrinogen 517 (H) 210 - 475 mg/dL    MAU Course  Procedures  MDM 1148: DW Dr. Langston Masker, will monitor x 4 hours and get a fibrinogen.  1452: Patient reports that she has been feeling normal fetal movement at this time. Some back pain, tylenol has helped. Patient has slept off and on while here.  FHT: 130, moderate with 15x15 accels, no  decels Toco: no UCs   Assessment and Plan   1. Fall, initial encounter   2. [redacted] weeks gestation of pregnancy    DC home Comfort measures reviewed  3rd Trimester precautions  Bleeding precautions labor precautions  Fetal kick counts RX: none  Return to MAU as needed FU with OB as planned  Follow-up Information    [facility] Follow up.   Contact  information: [address] [place] [address] [phone]            Thressa Sheller 06/27/2017, 11:15 AM

## 2017-06-27 NOTE — Discharge Instructions (Signed)
Third Trimester of Pregnancy The third trimester is from week 28 through week 40 (months 7 through 9). The third trimester is a time when the unborn baby (fetus) is growing rapidly. At the end of the ninth month, the fetus is about 20 inches in length and weighs 6-10 pounds. Body changes during your third trimester Your body will continue to go through many changes during pregnancy. The changes vary from woman to woman. During the third trimester:  Your weight will continue to increase. You can expect to gain 25-35 pounds (11-16 kg) by the end of the pregnancy.  You may begin to get stretch marks on your hips, abdomen, and breasts.  You may urinate more often because the fetus is moving lower into your pelvis and pressing on your bladder.  You may develop or continue to have heartburn. This is caused by increased hormones that slow down muscles in the digestive tract.  You may develop or continue to have constipation because increased hormones slow digestion and cause the muscles that push waste through your intestines to relax.  You may develop hemorrhoids. These are swollen veins (varicose veins) in the rectum that can itch or be painful.  You may develop swollen, bulging veins (varicose veins) in your legs.  You may have increased body aches in the pelvis, back, or thighs. This is due to weight gain and increased hormones that are relaxing your joints.  You may have changes in your hair. These can include thickening of your hair, rapid growth, and changes in texture. Some women also have hair loss during or after pregnancy, or hair that feels dry or thin. Your hair will most likely return to normal after your baby is born.  Your breasts will continue to grow and they will continue to become tender. A yellow fluid (colostrum) may leak from your breasts. This is the first milk you are producing for your baby.  Your belly button may stick out.  You may notice more swelling in your hands,  face, or ankles.  You may have increased tingling or numbness in your hands, arms, and legs. The skin on your belly may also feel numb.  You may feel short of breath because of your expanding uterus.  You may have more problems sleeping. This can be caused by the size of your belly, increased need to urinate, and an increase in your body's metabolism.  You may notice the fetus "dropping," or moving lower in your abdomen (lightening).  You may have increased vaginal discharge.  You may notice your joints feel loose and you may have pain around your pelvic bone.  What to expect at prenatal visits You will have prenatal exams every 2 weeks until week 36. Then you will have weekly prenatal exams. During a routine prenatal visit:  You will be weighed to make sure you and the baby are growing normally.  Your blood pressure will be taken.  Your abdomen will be measured to track your baby's growth.  The fetal heartbeat will be listened to.  Any test results from the previous visit will be discussed.  You may have a cervical check near your due date to see if your cervix has softened or thinned (effaced).  You will be tested for Group B streptococcus. This happens between 35 and 37 weeks.  Your health care provider may ask you:  What your birth plan is.  How you are feeling.  If you are feeling the baby move.  If you have had   any abnormal symptoms, such as leaking fluid, bleeding, severe headaches, or abdominal cramping.  If you are using any tobacco products, including cigarettes, chewing tobacco, and electronic cigarettes.  If you have any questions.  Other tests or screenings that may be performed during your third trimester include:  Blood tests that check for low iron levels (anemia).  Fetal testing to check the health, activity level, and growth of the fetus. Testing is done if you have certain medical conditions or if there are problems during the  pregnancy.  Nonstress test (NST). This test checks the health of your baby to make sure there are no signs of problems, such as the baby not getting enough oxygen. During this test, a belt is placed around your belly. The baby is made to move, and its heart rate is monitored during movement.  What is false labor? False labor is a condition in which you feel small, irregular tightenings of the muscles in the womb (contractions) that usually go away with rest, changing position, or drinking water. These are called Braxton Hicks contractions. Contractions may last for hours, days, or even weeks before true labor sets in. If contractions come at regular intervals, become more frequent, increase in intensity, or become painful, you should see your health care provider. What are the signs of labor?  Abdominal cramps.  Regular contractions that start at 10 minutes apart and become stronger and more frequent with time.  Contractions that start on the top of the uterus and spread down to the lower abdomen and back.  Increased pelvic pressure and dull back pain.  A watery or bloody mucus discharge that comes from the vagina.  Leaking of amniotic fluid. This is also known as your "water breaking." It could be a slow trickle or a gush. Let your health care provider know if it has a color or strange odor. If you have any of these signs, call your health care provider right away, even if it is before your due date. Follow these instructions at home: Medicines  Follow your health care provider's instructions regarding medicine use. Specific medicines may be either safe or unsafe to take during pregnancy.  Take a prenatal vitamin that contains at least 600 micrograms (mcg) of folic acid.  If you develop constipation, try taking a stool softener if your health care provider approves. Eating and drinking  Eat a balanced diet that includes fresh fruits and vegetables, whole grains, good sources of protein  such as meat, eggs, or tofu, and low-fat dairy. Your health care provider will help you determine the amount of weight gain that is right for you.  Avoid raw meat and uncooked cheese. These carry germs that can cause birth defects in the baby.  If you have low calcium intake from food, talk to your health care provider about whether you should take a daily calcium supplement.  Eat four or five small meals rather than three large meals a day.  Limit foods that are high in fat and processed sugars, such as fried and sweet foods.  To prevent constipation: ? Drink enough fluid to keep your urine clear or pale yellow. ? Eat foods that are high in fiber, such as fresh fruits and vegetables, whole grains, and beans. Activity  Exercise only as directed by your health care provider. Most women can continue their usual exercise routine during pregnancy. Try to exercise for 30 minutes at least 5 days a week. Stop exercising if you experience uterine contractions.  Avoid heavy   lifting.  Do not exercise in extreme heat or humidity, or at high altitudes.  Wear low-heel, comfortable shoes.  Practice good posture.  You may continue to have sex unless your health care provider tells you otherwise. Relieving pain and discomfort  Take frequent breaks and rest with your legs elevated if you have leg cramps or low back pain.  Take warm sitz baths to soothe any pain or discomfort caused by hemorrhoids. Use hemorrhoid cream if your health care provider approves.  Wear a good support bra to prevent discomfort from breast tenderness.  If you develop varicose veins: ? Wear support pantyhose or compression stockings as told by your healthcare provider. ? Elevate your feet for 15 minutes, 3-4 times a day. Prenatal care  Write down your questions. Take them to your prenatal visits.  Keep all your prenatal visits as told by your health care provider. This is important. Safety  Wear your seat belt at  all times when driving.  Make a list of emergency phone numbers, including numbers for family, friends, the hospital, and police and fire departments. General instructions  Avoid cat litter boxes and soil used by cats. These carry germs that can cause birth defects in the baby. If you have a cat, ask someone to clean the litter box for you.  Do not travel far distances unless it is absolutely necessary and only with the approval of your health care provider.  Do not use hot tubs, steam rooms, or saunas.  Do not drink alcohol.  Do not use any products that contain nicotine or tobacco, such as cigarettes and e-cigarettes. If you need help quitting, ask your health care provider.  Do not use any medicinal herbs or unprescribed drugs. These chemicals affect the formation and growth of the baby.  Do not douche or use tampons or scented sanitary pads.  Do not cross your legs for long periods of time.  To prepare for the arrival of your baby: ? Take prenatal classes to understand, practice, and ask questions about labor and delivery. ? Make a trial run to the hospital. ? Visit the hospital and tour the maternity area. ? Arrange for maternity or paternity leave through employers. ? Arrange for family and friends to take care of pets while you are in the hospital. ? Purchase a rear-facing car seat and make sure you know how to install it in your car. ? Pack your hospital bag. ? Prepare the baby's nursery. Make sure to remove all pillows and stuffed animals from the baby's crib to prevent suffocation.  Visit your dentist if you have not gone during your pregnancy. Use a soft toothbrush to brush your teeth and be gentle when you floss. Contact a health care provider if:  You are unsure if you are in labor or if your water has broken.  You become dizzy.  You have mild pelvic cramps, pelvic pressure, or nagging pain in your abdominal area.  You have lower back pain.  You have persistent  nausea, vomiting, or diarrhea.  You have an unusual or bad smelling vaginal discharge.  You have pain when you urinate. Get help right away if:  Your water breaks before 37 weeks.  You have regular contractions less than 5 minutes apart before 37 weeks.  You have a fever.  You are leaking fluid from your vagina.  You have spotting or bleeding from your vagina.  You have severe abdominal pain or cramping.  You have rapid weight loss or weight gain.    You have shortness of breath with chest pain.  You notice sudden or extreme swelling of your face, hands, ankles, feet, or legs.  Your baby makes fewer than 10 movements in 2 hours.  You have severe headaches that do not go away when you take medicine.  You have vision changes. Summary  The third trimester is from week 28 through week 40, months 7 through 9. The third trimester is a time when the unborn baby (fetus) is growing rapidly.  During the third trimester, your discomfort may increase as you and your baby continue to gain weight. You may have abdominal, leg, and back pain, sleeping problems, and an increased need to urinate.  During the third trimester your breasts will keep growing and they will continue to become tender. A yellow fluid (colostrum) may leak from your breasts. This is the first milk you are producing for your baby.  False labor is a condition in which you feel small, irregular tightenings of the muscles in the womb (contractions) that eventually go away. These are called Braxton Hicks contractions. Contractions may last for hours, days, or even weeks before true labor sets in.  Signs of labor can include: abdominal cramps; regular contractions that start at 10 minutes apart and become stronger and more frequent with time; watery or bloody mucus discharge that comes from the vagina; increased pelvic pressure and dull back pain; and leaking of amniotic fluid. This information is not intended to replace advice  given to you by your health care provider. Make sure you discuss any questions you have with your health care provider. Document Released: 03/14/2001 Document Revised: 08/26/2015 Document Reviewed: 05/21/2012 Elsevier Interactive Patient Education  2017 [employer]  

## 2017-07-06 ENCOUNTER — Encounter (HOSPITAL_COMMUNITY): Payer: Self-pay | Admitting: *Deleted

## 2017-07-06 ENCOUNTER — Inpatient Hospital Stay (HOSPITAL_COMMUNITY)
Admission: AD | Admit: 2017-07-06 | Discharge: 2017-07-06 | Disposition: A | Discharge status: Home / Self Care | Payer: BC Managed Care – PPO | Source: Ambulatory Visit | Attending: Obstetrics and Gynecology | Admitting: Obstetrics and Gynecology

## 2017-07-06 DIAGNOSIS — Z3A39 39 weeks gestation of pregnancy: Secondary | ICD-10-CM | POA: Diagnosis not present

## 2017-07-06 DIAGNOSIS — Z87891 Personal history of nicotine dependence: Secondary | ICD-10-CM | POA: Insufficient documentation

## 2017-07-06 DIAGNOSIS — O26893 Other specified pregnancy related conditions, third trimester: Secondary | ICD-10-CM | POA: Insufficient documentation

## 2017-07-06 DIAGNOSIS — Z886 Allergy status to analgesic agent status: Secondary | ICD-10-CM | POA: Insufficient documentation

## 2017-07-06 DIAGNOSIS — Z885 Allergy status to narcotic agent status: Secondary | ICD-10-CM | POA: Insufficient documentation

## 2017-07-06 DIAGNOSIS — R51 Headache: Secondary | ICD-10-CM

## 2017-07-06 DIAGNOSIS — O9989 Other specified diseases and conditions complicating pregnancy, childbirth and the puerperium: Secondary | ICD-10-CM

## 2017-07-06 DIAGNOSIS — R519 Headache, unspecified: Secondary | ICD-10-CM

## 2017-07-06 DIAGNOSIS — R030 Elevated blood-pressure reading, without diagnosis of hypertension: Secondary | ICD-10-CM | POA: Diagnosis present

## 2017-07-06 LAB — URINALYSIS, ROUTINE W REFLEX MICROSCOPIC
BILIRUBIN URINE: NEGATIVE
Glucose, UA: 50 mg/dL — AB
Hgb urine dipstick: NEGATIVE
Ketones, ur: NEGATIVE mg/dL
Nitrite: NEGATIVE
Protein, ur: NEGATIVE mg/dL
SPECIFIC GRAVITY, URINE: 1.011 (ref 1.005–1.030)
pH: 5 (ref 5.0–8.0)

## 2017-07-06 LAB — CBC WITH DIFFERENTIAL/PLATELET
BASOS PCT: 0 %
Basophils Absolute: 0 10*3/uL (ref 0.0–0.1)
EOS ABS: 0.1 10*3/uL (ref 0.0–0.7)
EOS PCT: 1 %
HCT: 34.9 % — ABNORMAL LOW (ref 36.0–46.0)
HEMOGLOBIN: 12.1 g/dL (ref 12.0–15.0)
Lymphocytes Relative: 17 %
Lymphs Abs: 2 10*3/uL (ref 0.7–4.0)
MCH: 32.3 pg (ref 26.0–34.0)
MCHC: 34.7 g/dL (ref 30.0–36.0)
MCV: 93.1 fL (ref 78.0–100.0)
MONOS PCT: 7 %
Monocytes Absolute: 0.8 10*3/uL (ref 0.1–1.0)
NEUTROS PCT: 75 %
Neutro Abs: 9.1 10*3/uL — ABNORMAL HIGH (ref 1.7–7.7)
PLATELETS: 305 10*3/uL (ref 150–400)
RBC: 3.75 MIL/uL — ABNORMAL LOW (ref 3.87–5.11)
RDW: 14.2 % (ref 11.5–15.5)
WBC: 11.9 10*3/uL — AB (ref 4.0–10.5)

## 2017-07-06 LAB — COMPREHENSIVE METABOLIC PANEL
ALBUMIN: 2.7 g/dL — AB (ref 3.5–5.0)
ALT: 13 U/L — ABNORMAL LOW (ref 14–54)
ANION GAP: 10 (ref 5–15)
AST: 15 U/L (ref 15–41)
Alkaline Phosphatase: 135 U/L — ABNORMAL HIGH (ref 38–126)
BUN: 12 mg/dL (ref 6–20)
CHLORIDE: 107 mmol/L (ref 101–111)
CO2: 17 mmol/L — AB (ref 22–32)
Calcium: 8.3 mg/dL — ABNORMAL LOW (ref 8.9–10.3)
Creatinine, Ser: 0.38 mg/dL — ABNORMAL LOW (ref 0.44–1.00)
GFR calc Af Amer: >60 mL/min (ref >60)
GFR calc non Af Amer: >60 mL/min (ref >60)
GLUCOSE: 96 mg/dL (ref 65–99)
POTASSIUM: 3.8 mmol/L (ref 3.5–5.1)
SODIUM: 134 mmol/L — AB (ref 135–145)
Total Bilirubin: 0.6 mg/dL (ref 0.3–1.2)
Total Protein: 6.1 g/dL — ABNORMAL LOW (ref 6.5–8.1)

## 2017-07-06 LAB — PROTEIN / CREATININE RATIO, URINE
Creatinine, Urine: 77 mg/dL
PROTEIN CREATININE RATIO: 0.13 mg/mg{creat} (ref 0.00–0.15)
TOTAL PROTEIN, URINE: 10 mg/dL

## 2017-07-06 MED ORDER — BUTALBITAL-APAP-CAFFEINE 50-325-40 MG PO TABS
1.0000 | ORAL_TABLET | Freq: Once | ORAL | Status: AC
  Administered 2017-07-06: 1 via ORAL
  Filled 2017-07-06: qty 1

## 2017-07-06 NOTE — MAU Note (Addendum)
Have had headache for 3 days. Was dizzy before that. Saw doctor today and b/p was high. Got dizzy tonight so took b/p at [facility] and it was 148/90. Still have h/a. Tylenol not helping h/a. Sometimes see black spots in vision

## 2017-07-06 NOTE — MAU Provider Note (Signed)
History     CSN: [id number]  Arrival date and time: 07/06/17 2048   First Provider Initiated Contact with Patient 07/06/17 2138      Chief Complaint  Patient presents with  . Headache  . Hypertension  . Dizziness   HPI Ms. Sarah Estes is a 27 y.o. G1P0 at [redacted]w[redacted]d who presents to MAU today with complaint of headache and elevated blood pressure. The patient states headache x 3 days. She states BP in office of 130s/80s which is high for her. She was told to call if headache and dizziness persisted. When it did she went to [facility] tonight her BP was 148/90. She has tried Tylenol without relief of her headache. She has blurred vision and occasional floaters. She has mild LE edema unchanged from previous. She denies RUQ abdominal pain, vaginal bleeding, LOF today. She has few BH contractions and normal fetal movement. She denies complications with this pregnancy.    OB History    Gravida  1   Para      Term      Preterm      AB      Living        SAB      TAB      Ectopic      Multiple      Live Births              Past Medical History:  Diagnosis Date  . Adult ADHD   . Allergy   . Anorexia   . Anxiety and depression   . Asthma    as child  . Depression    doing fine now  . Endometriosis   . H/O gastric bypass   . Infection    UTI  . Narcolepsy and cataplexy   . Pseudotumor cerebri 2011  . Vaginal Pap smear, abnormal     Past Surgical History:  Procedure Laterality Date  . COLONOSCOPY    . COLPOSCOPY    . COSMETIC SURGERY    . GASTRIC BYPASS    . LAPAROSCOPIC ENDOMETRIOSIS FULGURATION    . LUMBAR PUNCTURE      Family History  Problem Relation Age of Onset  . Heart disease Maternal Grandfather   . Hypertension Maternal Grandfather   . Stroke Maternal Grandfather   . Diabetes Father   . Hypertension Father   . Hypertension Paternal Grandmother   . Mental retardation Paternal Grandmother   . Diabetes Paternal Grandfather   . Hyperlipidemia  Paternal Grandfather   . Hypertension Paternal Grandfather     Social History   Tobacco Use  . Smoking status: Former Smoker    Types: E-cigarettes  . Smokeless tobacco: Never Used  . Tobacco comment: [name] t Aug 2018  Substance Use Topics  . Alcohol use: No    Alcohol/week: 0.0 oz    Frequency: Never    Comment: Socially  . Drug use: No    Allergies:  Allergies  Allergen Reactions  . Corticosteroids Other (See Comments)    Injections cannot take due to gastric surgery (creams or tablets?)  . Gluten Meal Nausea Only and Other (See Comments)    Exacerbates endometriosis symptoms    . Nsaids Other (See Comments)    Had gastric bypass surgery   . Sulfa Antibiotics Hives, Itching and Rash  . Morphine And Related Itching    Medications Prior to Admission  Medication Sig Dispense Refill Last Dose  . acetaminophen (TYLENOL) 500 MG tablet Take 1,000 mg by  mouth every 6 (six) hours as needed.   07/06/2017 at Unknown time  . diphenhydrAMINE (BENADRYL) 25 MG tablet Take 25 mg by mouth every 6 (six) hours as needed for allergies.   Past Week at Unknown time  . Prenatal Vit-Fe Fumarate-FA (PRENATAL MULTIVITAMIN) TABS tablet Take 1 tablet by mouth daily at 12 noon.   07/05/2017 at Unknown time  . doxylamine, Sleep, (UNISOM) 25 MG tablet Take 25 mg by mouth 2 (two) times daily as needed for sleep.    06/22/2017  . pantoprazole (PROTONIX) 20 MG tablet TAKE 1 TABLET BY MOUTH EVERY DAY (Patient not taking: Reported on 07/06/2017) 30 tablet 0 Not Taking at Unknown time  . promethazine (PHENERGAN) 25 MG tablet Take 1 tablet (25 mg total) by mouth every 6 (six) hours as needed for nausea or vomiting. (Patient not taking: Reported on 06/27/2017) 30 tablet 0 Not Taking at Unknown time    Review of Systems  Constitutional: Negative for fever.  Eyes: Positive for visual disturbance.  Cardiovascular: Positive for leg swelling.  Gastrointestinal: Negative for abdominal pain, constipation, diarrhea, nausea  and vomiting.  Genitourinary: Negative for vaginal bleeding and vaginal discharge.  Neurological: Positive for headaches.   Physical Exam   Blood pressure 124/73, pulse 85, temperature 97.9 F (36.6 C), temperature source Oral, resp. rate 18, height 5\' 9"  (1.753 m), weight 250 lb (113.4 kg), last menstrual period 09/07/2016, SpO2 100 %.  Physical Exam  Nursing note and vitals reviewed. Constitutional: She is oriented to person, place, and time. She appears well-developed and well-nourished. No distress.  HENT:  Head: Normocephalic and atraumatic.  Cardiovascular: Normal rate, regular rhythm and normal heart sounds.  Respiratory: Effort normal and breath sounds normal. No respiratory distress. She has no wheezes.  GI: Soft. She exhibits no distension and no mass. There is tenderness (mild epigastric tenderness to palpation). There is no rebound and no guarding.  Musculoskeletal: She exhibits no edema.  Neurological: She is alert and oriented to person, place, and time. She has normal reflexes.  Skin: Skin is warm and dry. No erythema.  Psychiatric: She has a normal mood and affect.    Results for orders placed or performed during the hospital encounter of 07/06/17 (from the past 24 hour(s))  Urinalysis, Routine w reflex microscopic     Status: Abnormal   Collection Time: 07/06/17  9:30 PM  Result Value Ref Range   Color, Urine YELLOW YELLOW   APPearance HAZY (A) CLEAR   Specific Gravity, Urine 1.011 1.005 - 1.030   pH 5.0 5.0 - 8.0   Glucose, UA 50 (A) NEGATIVE mg/dL   Hgb urine dipstick NEGATIVE NEGATIVE   Bilirubin Urine NEGATIVE NEGATIVE   Ketones, ur NEGATIVE NEGATIVE mg/dL   Protein, ur NEGATIVE NEGATIVE mg/dL   Nitrite NEGATIVE NEGATIVE   Leukocytes, UA LARGE (A) NEGATIVE   RBC / HPF 6-30 0 - 5 RBC/hpf   WBC, UA TOO NUMEROUS TO COUNT 0 - 5 WBC/hpf   Bacteria, UA RARE (A) NONE SEEN   Squamous Epithelial / LPF 0-5 (A) NONE SEEN   WBC Clumps PRESENT    Mucus PRESENT    Protein / creatinine ratio, urine     Status: None   Collection Time: 07/06/17  9:30 PM  Result Value Ref Range   Creatinine, Urine 77.00 mg/dL   Total Protein, Urine 10 mg/dL   Protein Creatinine Ratio 0.13 0.00 - 0.15 mg/mg[Cre]  CBC with Differential/Platelet     Status: Abnormal   Collection Time:  07/06/17  9:38 PM  Result Value Ref Range   WBC 11.9 (H) 4.0 - 10.5 K/uL   RBC 3.75 (L) 3.87 - 5.11 MIL/uL   Hemoglobin 12.1 12.0 - 15.0 g/dL   HCT 56.2 (L) 13.0 - 86.5 %   MCV 93.1 78.0 - 100.0 fL   MCH 32.3 26.0 - 34.0 pg   MCHC 34.7 30.0 - 36.0 g/dL   RDW 78.4 69.6 - 29.5 %   Platelets 305 150 - 400 K/uL   Neutrophils Relative % 75 %   Neutro Abs 9.1 (H) 1.7 - 7.7 K/uL   Lymphocytes Relative 17 %   Lymphs Abs 2.0 0.7 - 4.0 K/uL   Monocytes Relative 7 %   Monocytes Absolute 0.8 0.1 - 1.0 K/uL   Eosinophils Relative 1 %   Eosinophils Absolute 0.1 0.0 - 0.7 K/uL   Basophils Relative 0 %   Basophils Absolute 0.0 0.0 - 0.1 K/uL  Comprehensive metabolic panel     Status: Abnormal   Collection Time: 07/06/17  9:38 PM  Result Value Ref Range   Sodium 134 (L) 135 - 145 mmol/L   Potassium 3.8 3.5 - 5.1 mmol/L   Chloride 107 101 - 111 mmol/L   CO2 17 (L) 22 - 32 mmol/L   Glucose, Bld 96 65 - 99 mg/dL   BUN 12 6 - 20 mg/dL   Creatinine, Ser 2.84 (L) 0.44 - 1.00 mg/dL   Calcium 8.3 (L) 8.9 - 10.3 mg/dL   Total Protein 6.1 (L) 6.5 - 8.1 g/dL   Albumin 2.7 (L) 3.5 - 5.0 g/dL   AST 15 15 - 41 U/L   ALT 13 (L) 14 - 54 U/L   Alkaline Phosphatase 135 (H) 38 - 126 U/L   Total Bilirubin 0.6 0.3 - 1.2 mg/dL   GFR calc non Af Amer >60 >60 mL/min   GFR calc Af Amer >60 >60 mL/min   Anion gap 10 5 - 15    MAU Course  Procedures None  MDM CBC, CMP, UA, Urine protein/creatinine  Fioricet given for headache with slight improvement Discussed patient with Dr. Rana Snare. Ok for discharge at this time. Follow-up in the office on Monday as planned for BP check or call sooner with worsening  symptoms.   Assessment and Plan  A: [facility] at [redacted]w[redacted]d Headache   P:  Discharge home Tylenol PRN for pain Pre-eclampsia, worsening HTN and labor precautions discussed Patient advised to follow-up with [facility] on Monday as scheduled for BP check or call sooner if symptoms change or worsen Patient may return to MAU as needed or if her condition were to change or worsen   Vonzella Nipple, PA-C 07/06/2017, 10:46 PM

## 2017-07-06 NOTE — Discharge Instructions (Signed)
Preeclampsia and Eclampsia °Preeclampsia is a serious condition that develops only during pregnancy. It is also called toxemia of pregnancy. This condition causes high blood pressure along with other symptoms, such as swelling and headaches. These symptoms may develop as the condition gets worse. Preeclampsia may occur at 20 weeks of pregnancy or later. °Diagnosing and treating preeclampsia early is very important. If not treated early, it can cause serious problems for you and your baby. One problem it can lead to is eclampsia, which is a condition that causes muscle jerking or shaking (convulsions or seizures) in the mother. Delivering your baby is the best treatment for preeclampsia or eclampsia. Preeclampsia and eclampsia symptoms usually go away after your baby is born. °What are the causes? °The cause of preeclampsia is not known. °What increases the risk? °The following risk factors make you more likely to develop preeclampsia: °· Being pregnant for the first time. °· Having had preeclampsia during a past pregnancy. °· Having a family history of preeclampsia. °· Having high blood pressure. °· Being pregnant with twins or triplets. °· Being 35 or older. °· Being African-American. °· Having kidney disease or diabetes. °· Having medical conditions such as lupus or blood diseases. °· Being very overweight (obese). ° °What are the signs or symptoms? °The earliest signs of preeclampsia are: °· High blood pressure. °· Increased protein in your urine. Your health care provider will check for this at every visit before you give birth (prenatal visit). ° °Other symptoms that may develop as the condition gets worse include: °· Severe headaches. °· Sudden weight gain. °· Swelling of the hands, face, legs, and feet. °· Nausea and vomiting. °· Vision problems, such as blurred or double vision. °· Numbness in the face, arms, legs, and feet. °· Urinating less than usual. °· Dizziness. °· Slurred speech. °· Abdominal pain,  especially upper abdominal pain. °· Convulsions or seizures. ° °Symptoms generally go away after giving birth. °How is this diagnosed? °There are no screening tests for preeclampsia. Your health care provider will ask you about symptoms and check for signs of preeclampsia during your prenatal visits. You may also have tests that include: °· Urine tests. °· Blood tests. °· Checking your blood pressure. °· Monitoring your baby’s heart rate. °· Ultrasound. ° °How is this treated? °You and your health care provider will determine the treatment approach that is best for you. Treatment may include: °· Having more frequent prenatal exams to check for signs of preeclampsia, if you have an increased risk for preeclampsia. °· Bed rest. °· Reducing how much salt (sodium) you eat. °· Medicine to lower your blood pressure. °· Staying in the hospital, if your condition is severe. There, treatment will focus on controlling your blood pressure and the amount of fluids in your body (fluid retention). °· You may need to take medicine (magnesium sulfate) to prevent seizures. This medicine may be given as an injection or through an IV tube. °· Delivering your baby early, if your condition gets worse. You may have your labor started with medicine (induced), or you may have a cesarean delivery. ° °Follow these instructions at home: °Eating and drinking ° °· Drink enough fluid to keep your urine clear or pale yellow. °· Eat a healthy diet that is low in sodium. Do not add salt to your food. Check nutrition labels to see how much sodium a food or beverage contains. °· Avoid caffeine. °Lifestyle °· Do not use any products that contain nicotine or tobacco, such as cigarettes   and e-cigarettes. If you need help quitting, ask your health care provider. °· Do not use alcohol or drugs. °· Avoid stress as much as possible. Rest and get plenty of sleep. °General instructions °· Take over-the-counter and prescription medicines only as told by your  health care provider. °· When lying down, lie on your side. This keeps pressure off of your baby. °· When sitting or lying down, raise (elevate) your feet. Try putting some pillows underneath your lower legs. °· Exercise regularly. Ask your health care provider what kinds of exercise are best for you. °· Keep all follow-up and prenatal visits as told by your health care provider. This is important. °How is this prevented? °To prevent preeclampsia or eclampsia from developing during another pregnancy: °· Get proper medical care during pregnancy. Your health care provider may be able to prevent preeclampsia or diagnose and treat it early. °· Your health care provider may have you take a low-dose aspirin or a calcium supplement during your next pregnancy. °· You may have tests of your blood pressure and kidney function after giving birth. °· Maintain a healthy weight. Ask your health care provider for help managing weight gain during pregnancy. °· Work with your health care provider to manage any long-term (chronic) health conditions you have, such as diabetes or kidney problems. ° °Contact a health care provider if: °· You gain more weight than expected. °· You have headaches. °· You have nausea or vomiting. °· You have abdominal pain. °· You feel dizzy or light-headed. °Get help right away if: °· You develop sudden or severe swelling anywhere in your body. This usually happens in the legs. °· You gain 5 lbs (2.3 kg) or more during one week. °· You have severe: °? Abdominal pain. °? Headaches. °? Dizziness. °? Vision problems. °? Confusion. °? Nausea or vomiting. °· You have a seizure. °· You have trouble moving any part of your body. °· You develop numbness in any part of your body. °· You have trouble speaking. °· You have any abnormal bleeding. °· You pass out. °This information is not intended to replace advice given to you by your health care provider. Make sure you discuss any questions you have with your health  care provider. °Document Released: 03/17/2000 Document Revised: 11/16/2015 Document Reviewed: 10/25/2015 °Elsevier Interactive Patient Education © 2018 [employer] ° °

## 2017-07-09 ENCOUNTER — Encounter (HOSPITAL_COMMUNITY): Payer: Self-pay | Admitting: *Deleted

## 2017-07-09 ENCOUNTER — Telehealth (HOSPITAL_COMMUNITY): Payer: Self-pay | Admitting: *Deleted

## 2017-07-09 NOTE — Telephone Encounter (Signed)
Preadmission screen  

## 2017-07-10 ENCOUNTER — Encounter (HOSPITAL_COMMUNITY): Payer: Self-pay | Admitting: *Deleted

## 2017-07-10 ENCOUNTER — Telehealth (HOSPITAL_COMMUNITY): Payer: Self-pay | Admitting: *Deleted

## 2017-07-10 LAB — OB RESULTS CONSOLE GBS: STREP GROUP B AG: POSITIVE

## 2017-07-10 NOTE — Telephone Encounter (Signed)
Preadmission screen  

## 2017-07-11 ENCOUNTER — Encounter (HOSPITAL_COMMUNITY): Payer: Self-pay | Admitting: *Deleted

## 2017-07-11 ENCOUNTER — Inpatient Hospital Stay (HOSPITAL_COMMUNITY)
Admission: AD | Admit: 2017-07-11 | Discharge: 2017-07-13 | DRG: 807 | Disposition: A | Discharge status: Home / Self Care | Payer: BC Managed Care – PPO | Source: Ambulatory Visit | Attending: Obstetrics and Gynecology | Admitting: Obstetrics and Gynecology

## 2017-07-11 DIAGNOSIS — O99214 Obesity complicating childbirth: Secondary | ICD-10-CM | POA: Diagnosis present

## 2017-07-11 DIAGNOSIS — O99824 Streptococcus B carrier state complicating childbirth: Secondary | ICD-10-CM | POA: Diagnosis present

## 2017-07-11 DIAGNOSIS — O4292 Full-term premature rupture of membranes, unspecified as to length of time between rupture and onset of labor: Principal | ICD-10-CM | POA: Diagnosis present

## 2017-07-11 DIAGNOSIS — O99844 Bariatric surgery status complicating childbirth: Secondary | ICD-10-CM | POA: Diagnosis present

## 2017-07-11 DIAGNOSIS — Z87891 Personal history of nicotine dependence: Secondary | ICD-10-CM

## 2017-07-11 DIAGNOSIS — Z3490 Encounter for supervision of normal pregnancy, unspecified, unspecified trimester: Secondary | ICD-10-CM

## 2017-07-11 DIAGNOSIS — E669 Obesity, unspecified: Secondary | ICD-10-CM | POA: Diagnosis present

## 2017-07-11 DIAGNOSIS — Z3A40 40 weeks gestation of pregnancy: Secondary | ICD-10-CM | POA: Diagnosis not present

## 2017-07-11 LAB — CBC
HEMATOCRIT: 36.6 % (ref 36.0–46.0)
HEMOGLOBIN: 12.7 g/dL (ref 12.0–15.0)
MCH: 32.4 pg (ref 26.0–34.0)
MCHC: 34.7 g/dL (ref 30.0–36.0)
MCV: 93.4 fL (ref 78.0–100.0)
Platelets: 347 10*3/uL (ref 150–400)
RBC: 3.92 MIL/uL (ref 3.87–5.11)
RDW: 14.5 % (ref 11.5–15.5)
WBC: 12.8 10*3/uL — AB (ref 4.0–10.5)

## 2017-07-11 LAB — TYPE AND SCREEN
ABO/RH(D): A POS
ANTIBODY SCREEN: NEGATIVE

## 2017-07-11 MED ORDER — OXYTOCIN BOLUS FROM INFUSION
500.0000 mL | Freq: Once | INTRAVENOUS | Status: AC
  Administered 2017-07-12: 500 mL via INTRAVENOUS

## 2017-07-11 MED ORDER — FLEET ENEMA 7-19 GM/118ML RE ENEM: 1.0000 | ENEMA | RECTAL | Status: DC | PRN

## 2017-07-11 MED ORDER — DIPHENHYDRAMINE HCL 50 MG/ML IJ SOLN
12.5000 mg | INTRAMUSCULAR | Status: DC | PRN
  Administered 2017-07-12: 12.5 mg via INTRAVENOUS
  Filled 2017-07-11: qty 1

## 2017-07-11 MED ORDER — PHENYLEPHRINE 40 MCG/ML (10ML) SYRINGE FOR IV PUSH (FOR BLOOD PRESSURE SUPPORT)
80.0000 ug | PREFILLED_SYRINGE | INTRAVENOUS | Status: DC | PRN
  Filled 2017-07-11: qty 5

## 2017-07-11 MED ORDER — EPHEDRINE 5 MG/ML INJ
10.0000 mg | INTRAVENOUS | Status: DC | PRN
  Filled 2017-07-11: qty 2

## 2017-07-11 MED ORDER — TERBUTALINE SULFATE 1 MG/ML IJ SOLN
0.2500 mg | Freq: Once | INTRAMUSCULAR | Status: DC | PRN
  Filled 2017-07-11: qty 1

## 2017-07-11 MED ORDER — SODIUM CHLORIDE 0.9 % IV SOLN
5.0000 10*6.[IU] | Freq: Once | INTRAVENOUS | Status: AC
  Administered 2017-07-11: 5 10*6.[IU] via INTRAVENOUS
  Filled 2017-07-11: qty 5

## 2017-07-11 MED ORDER — BUTORPHANOL TARTRATE 1 MG/ML IJ SOLN
1.0000 mg | INTRAMUSCULAR | Status: DC | PRN
  Administered 2017-07-11: 1 mg via INTRAVENOUS
  Filled 2017-07-11: qty 1

## 2017-07-11 MED ORDER — ONDANSETRON HCL 4 MG/2ML IJ SOLN: 4.0000 mg | Freq: Four times a day (QID) | INTRAMUSCULAR | Status: DC | PRN

## 2017-07-11 MED ORDER — SOD CITRATE-CITRIC ACID 500-334 MG/5ML PO SOLN: 30.0000 mL | ORAL | Status: DC | PRN

## 2017-07-11 MED ORDER — LACTATED RINGERS IV SOLN: 500.0000 mL | INTRAVENOUS | Status: DC | PRN

## 2017-07-11 MED ORDER — LACTATED RINGERS IV SOLN
500.0000 mL | Freq: Once | INTRAVENOUS | Status: DC
Start: 2017-07-11 — End: 2017-07-12

## 2017-07-11 MED ORDER — ACETAMINOPHEN 325 MG PO TABS: 650.0000 mg | ORAL_TABLET | ORAL | Status: DC | PRN

## 2017-07-11 MED ORDER — PHENYLEPHRINE 40 MCG/ML (10ML) SYRINGE FOR IV PUSH (FOR BLOOD PRESSURE SUPPORT)
80.0000 ug | PREFILLED_SYRINGE | INTRAVENOUS | Status: DC | PRN
  Filled 2017-07-11: qty 10
  Filled 2017-07-11: qty 5

## 2017-07-11 MED ORDER — PENICILLIN G POT IN DEXTROSE 60000 UNIT/ML IV SOLN
3.0000 10*6.[IU] | INTRAVENOUS | Status: DC
  Administered 2017-07-11 – 2017-07-12 (×2): 3 10*6.[IU] via INTRAVENOUS
  Filled 2017-07-11 (×5): qty 50

## 2017-07-11 MED ORDER — LACTATED RINGERS IV SOLN
INTRAVENOUS | Status: DC
  Administered 2017-07-11: 16:00:00 via INTRAVENOUS

## 2017-07-11 MED ORDER — OXYTOCIN 40 UNITS IN LACTATED RINGERS INFUSION - SIMPLE MED
1.0000 m[IU]/min | INTRAVENOUS | Status: DC
  Administered 2017-07-11: 2 m[IU]/min via INTRAVENOUS
  Administered 2017-07-11: 4 m[IU]/min via INTRAVENOUS

## 2017-07-11 MED ORDER — FENTANYL 2.5 MCG/ML BUPIVACAINE 1/10 % EPIDURAL INFUSION (WH - ANES)
14.0000 mL/h | INTRAMUSCULAR | Status: DC | PRN
  Administered 2017-07-12: 14 mL/h via EPIDURAL
  Filled 2017-07-11: qty 100

## 2017-07-11 MED ORDER — OXYTOCIN 40 UNITS IN LACTATED RINGERS INFUSION - SIMPLE MED
2.5000 [IU]/h | INTRAVENOUS | Status: DC
  Filled 2017-07-11: qty 1000

## 2017-07-11 MED ORDER — LIDOCAINE HCL (PF) 1 % IJ SOLN
30.0000 mL | INTRAMUSCULAR | Status: DC | PRN
  Filled 2017-07-11: qty 30
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
## 2017-07-11 NOTE — Anesthesia Pain Management Evaluation Note (Signed)
  CRNA Pain Management Visit Note  Patient: Sarah FrameStephanie Estes, 27 y.o., female  "Hello I am a member of the anesthesia team at [facility]. We have an anesthesia team available at all times to provide care throughout the hospital, including epidural management and anesthesia for C-section. I don't know your plan for the delivery whether it a natural birth, water birth, IV sedation, nitrous supplementation, doula or epidural, but we want to meet your pain goals."   1.Was your pain managed to your expectations on prior hospitalizations?   No prior hospitalizations  2.What is your expectation for pain management during this hospitalization?     Epidural  3.How can we help you reach that goal? support  Record the patient's initial score and the patient's pain goal.   Pain: 1  Pain Goal: 6 [facility] wants you to be able to say your pain was always managed very well.  [name],[name] 07/11/2017

## 2017-07-12 ENCOUNTER — Inpatient Hospital Stay (HOSPITAL_COMMUNITY): Payer: BC Managed Care – PPO | Admitting: Anesthesiology

## 2017-07-12 LAB — RPR: RPR: NONREACTIVE

## 2017-07-12 MED ORDER — SENNOSIDES-DOCUSATE SODIUM 8.6-50 MG PO TABS
2.0000 | ORAL_TABLET | ORAL | Status: DC
  Administered 2017-07-13: 2 via ORAL
  Filled 2017-07-12: qty 2

## 2017-07-12 MED ORDER — MEASLES, MUMPS & RUBELLA VAC ~~LOC~~ INJ
0.5000 mL | INJECTION | Freq: Once | SUBCUTANEOUS | Status: DC
  Filled 2017-07-12: qty 0.5

## 2017-07-12 MED ORDER — DIPHENHYDRAMINE HCL 25 MG PO CAPS: 25.0000 mg | ORAL_CAPSULE | Freq: Four times a day (QID) | ORAL | Status: DC | PRN

## 2017-07-12 MED ORDER — WITCH HAZEL-GLYCERIN EX PADS: 1.0000 "application " | MEDICATED_PAD | CUTANEOUS | Status: DC | PRN

## 2017-07-12 MED ORDER — ACETAMINOPHEN 325 MG PO TABS: 650.0000 mg | ORAL_TABLET | ORAL | Status: DC | PRN

## 2017-07-12 MED ORDER — PRENATAL MULTIVITAMIN CH
1.0000 | ORAL_TABLET | Freq: Every day | ORAL | Status: DC
  Administered 2017-07-12 – 2017-07-13 (×2): 1 via ORAL
  Filled 2017-07-12 (×2): qty 1

## 2017-07-12 MED ORDER — COCONUT OIL OIL: 1.0000 "application " | TOPICAL_OIL | Status: DC | PRN

## 2017-07-12 MED ORDER — DIBUCAINE 1 % RE OINT: 1.0000 "application " | TOPICAL_OINTMENT | RECTAL | Status: DC | PRN

## 2017-07-12 MED ORDER — ONDANSETRON HCL 4 MG/2ML IJ SOLN
4.0000 mg | INTRAMUSCULAR | Status: DC | PRN
Start: 2017-07-12 — End: 2017-07-13

## 2017-07-12 MED ORDER — OXYCODONE-ACETAMINOPHEN 5-325 MG PO TABS
2.0000 | ORAL_TABLET | ORAL | Status: DC | PRN
  Administered 2017-07-13: 2 via ORAL
  Filled 2017-07-12: qty 2

## 2017-07-12 MED ORDER — SIMETHICONE 80 MG PO CHEW
80.0000 mg | CHEWABLE_TABLET | ORAL | Status: DC | PRN
Start: 2017-07-12 — End: 2017-07-13

## 2017-07-12 MED ORDER — BENZOCAINE-MENTHOL 20-0.5 % EX AERO
1.0000 "application " | INHALATION_SPRAY | CUTANEOUS | Status: DC | PRN
  Filled 2017-07-12: qty 56

## 2017-07-12 MED ORDER — LIDOCAINE HCL (PF) 1 % IJ SOLN
INTRAMUSCULAR | Status: DC | PRN
  Administered 2017-07-12: 2 mL via EPIDURAL
  Administered 2017-07-12: 5 mL via EPIDURAL
  Administered 2017-07-12: 3 mL via EPIDURAL

## 2017-07-12 MED ORDER — MEDROXYPROGESTERONE ACETATE 150 MG/ML IM SUSP: 150.0000 mg | INTRAMUSCULAR | Status: DC | PRN

## 2017-07-12 MED ORDER — TETANUS-DIPHTH-ACELL PERTUSSIS 5-2.5-18.5 LF-MCG/0.5 IM SUSP: 0.5000 mL | Freq: Once | INTRAMUSCULAR | Status: DC

## 2017-07-12 MED ORDER — ONDANSETRON HCL 4 MG PO TABS: 4.0000 mg | ORAL_TABLET | ORAL | Status: DC | PRN

## 2017-07-12 MED ORDER — OXYCODONE-ACETAMINOPHEN 5-325 MG PO TABS
1.0000 | ORAL_TABLET | ORAL | Status: DC | PRN
  Administered 2017-07-12 – 2017-07-13 (×5): 1 via ORAL
  Filled 2017-07-12 (×5): qty 1

## 2017-07-12 MED ORDER — LACTATED RINGERS IV SOLN: 500.0000 mL | Freq: Once | INTRAVENOUS | Status: DC

## 2017-07-12 NOTE — Progress Notes (Signed)
Post Partum Day 0 Subjective: no complaints  Objective: Blood pressure 118/70, pulse 92, temperature 99.2 F (37.3 C), temperature source Axillary, resp. rate 18, height 5\' 9"  (1.753 m), weight 249 lb 9.6 oz (113.2 kg), last menstrual period 09/07/2016, SpO2 98 %.  Physical Exam:  General: alert Lochia: appropriate Uterine Fundus: firm Incision: healing well DVT Evaluation: No evidence of DVT seen on physical exam.  Recent Labs    07/11/17 1540  HGB 12.7  HCT 36.6    Assessment/Plan: Plan for discharge tomorrow   LOS: 1 day   Meriel PicaRichard M Avi Archuleta 07/12/2017, 8:10 AM

## 2017-07-12 NOTE — Anesthesia Preprocedure Evaluation (Addendum)
Anesthesia Evaluation  Patient identified by MRN, date of birth, ID band Patient awake    Reviewed: Allergy & Precautions, NPO status , Patient's Chart, lab work & pertinent test results  Airway Mallampati: II  TM Distance: >3 FB Neck ROM: Full    Dental  (+) Teeth Intact, Dental Advisory Given   Pulmonary asthma , former smoker,    Pulmonary exam normal breath sounds clear to auscultation       Cardiovascular negative cardio ROS Normal cardiovascular exam Rhythm:Regular Rate:Normal     Neuro/Psych PSYCHIATRIC DISORDERS Anxiety Depression Pseudotumor cerebri--denies neck stiffness, blurred vision, headaches    GI/Hepatic Neg liver ROS, H/o gastric bypass   Endo/Other  Obesity   Renal/GU negative Renal ROS     Musculoskeletal negative musculoskeletal ROS (+)   Abdominal   Peds  Hematology negative hematology ROS (+) Plt 347k   Anesthesia Other Findings Day of surgery medications reviewed with the patient.  Reproductive/Obstetrics                            Anesthesia Physical Anesthesia Plan  ASA: III  Anesthesia Plan: Epidural   Post-op Pain Management:    Induction:   PONV Risk Score and Plan: 2 and Treatment may vary due to age or medical condition  Airway Management Planned:   Additional Equipment:   Intra-op Plan:   Post-operative Plan:   Informed Consent: I have reviewed the patients History and Physical, chart, labs and discussed the procedure including the risks, benefits and alternatives for the proposed anesthesia with the patient or authorized representative who has indicated his/her understanding and acceptance.   Dental advisory given  Plan Discussed with:   Anesthesia Plan Comments: (Patient identified. Risks/Benefits/Options discussed with patient including but not limited to bleeding, infection, nerve damage, paralysis, failed block, incomplete pain  control, headache, blood pressure changes, nausea, vomiting, reactions to medication both or allergic, itching and postpartum back pain. Confirmed with bedside nurse the patient's most recent platelet count. Confirmed with patient that they are not currently taking any anticoagulation, have any bleeding history or any family history of bleeding disorders. Patient expressed understanding and wished to proceed. All questions were answered. )        Anesthesia Quick Evaluation

## 2017-07-12 NOTE — Progress Notes (Signed)
SVD of vigorous female infant w/ apgars of 9,9.  Placenta delivered spontaneous w/ 3VC.   2nd degree midline episiotomy repaired w/ 3-0 vicryl rapide.  Fundus firm.  EBL 150cc .

## 2017-07-12 NOTE — Anesthesia Postprocedure Evaluation (Signed)
Anesthesia Post Note  Patient: Sarah FrameStephanie Estes  Procedure(s) Performed: AN AD HOC LABOR EPIDURAL     Patient location during evaluation: Mother Baby Anesthesia Type: Epidural Level of consciousness: awake, awake and alert and oriented Pain management: pain level controlled Vital Signs Assessment: post-procedure vital signs reviewed and stable Respiratory status: spontaneous breathing, nonlabored ventilation and respiratory function stable Cardiovascular status: stable Postop Assessment: no headache, no backache, patient able to bend at knees, no apparent nausea or vomiting, adequate PO intake and epidural receding Anesthetic complications: no    Last Vitals:  Vitals:   07/12/17 0623 07/12/17 0700  BP: 124/77 118/70  Pulse: 81 92  Resp: 19 18  Temp: 36.8 C 37.3 C  SpO2: 98% 98%    Last Pain:  Vitals:   07/12/17 0700  TempSrc: Axillary  PainSc:    Pain Goal:                 Ronnie Doo

## 2017-07-12 NOTE — Lactation Note (Signed)
This note was copied from a baby's chart. Lactation Consultation Note  Patient Name: Girl StephanieRicarda Frame Sarris RUEAV'WToday's Date: 07/12/2017 Reason for consult: Initial assessment;1st time breastfeeding;Primapara;Term  G1P1 mother whose infant is now 825 hours old.  Per mom, infant latched well in labor and delivery.  She has tried to feed a couple of times since arriving to her room but the baby has been too sleepy.  Reassured mother that this is normal behavior for an infant that is 5 hours old.  LC reviewed undressing, changing diaper and STS to help awaken infant for feeds.  Also discussed stomach size of the baby.  Discussed with mother STS, feeding 8-12 times/24 hours or earlier if she shows feeding cues, voiding and stooling, and calling for latch assistance as needed.  Mother stated that she tried the cradle position initially.  LC suggested she could also try the football or cross cradle position and mother very interested in trying the football hold for the next feeding.  Her mother is present and one of her support persons and has experience breastfeeding.  Mother will call as needed.  Maternal Data Formula Feeding for Exclusion: No Does the patient have breastfeeding experience prior to this delivery?: No                   Consult Status Consult Status: Follow-up Date: 07/13/17 Follow-up type: In-patient    Shriyans Kuenzi R Opie Fanton 07/12/2017, 10:25 AM

## 2017-07-12 NOTE — Anesthesia Procedure Notes (Signed)
Epidural Patient location during procedure: OB Start time: 07/12/2017 12:06 AM End time: 07/12/2017 12:16 AM  Staffing Anesthesiologist: Cecile Hearingurk, Hakiem Malizia Edward, MD Performed: anesthesiologist   Preanesthetic Checklist Completed: patient identified, pre-op evaluation, timeout performed, IV checked, risks and benefits discussed and monitors and equipment checked  Epidural Patient position: sitting Prep: DuraPrep Patient monitoring: blood pressure and continuous pulse ox Approach: midline Location: L3-L4 Injection technique: LOR air  Needle:  Needle type: Tuohy  Needle gauge: 17 G Needle length: 9 cm Needle insertion depth: 9 cm Catheter size: 19 Gauge Catheter at skin depth: 15 cm Test dose: negative and Other (1% Lidocaine)  Additional Notes Patient identified.  Risk benefits discussed including failed block, incomplete pain control, headache, nerve damage, paralysis, blood pressure changes, nausea, vomiting, reactions to medication both toxic or allergic, and postpartum back pain.  Patient expressed understanding and wished to proceed.  All questions were answered.  Sterile technique used throughout procedure and epidural site dressed with sterile barrier dressing. No paresthesia or other complications noted. The patient did not experience any signs of intravascular injection such as tinnitus or metallic taste in mouth nor signs of intrathecal spread such as rapid motor block. Please see nursing notes for vital signs. Reason for block:procedure for pain

## 2017-07-12 NOTE — Progress Notes (Signed)
Vital signs taken at 0730.

## 2017-07-13 ENCOUNTER — Encounter (HOSPITAL_COMMUNITY): Payer: Self-pay | Admitting: *Deleted

## 2017-07-13 LAB — CBC
HEMATOCRIT: 32.2 % — AB (ref 36.0–46.0)
Hemoglobin: 11.2 g/dL — ABNORMAL LOW (ref 12.0–15.0)
MCH: 32.4 pg (ref 26.0–34.0)
MCHC: 34.8 g/dL (ref 30.0–36.0)
MCV: 93.1 fL (ref 78.0–100.0)
PLATELETS: 266 10*3/uL (ref 150–400)
RBC: 3.46 MIL/uL — ABNORMAL LOW (ref 3.87–5.11)
RDW: 14.8 % (ref 11.5–15.5)
WBC: 11.5 10*3/uL — ABNORMAL HIGH (ref 4.0–10.5)

## 2017-07-13 NOTE — Discharge Summary (Signed)
Obstetric Discharge Summary Reason for Admission: rupture of membranes Prenatal Procedures: none Intrapartum Procedures: spontaneous vaginal delivery Postpartum Procedures: none Complications-Operative and Postpartum: 2nd degree perineal laceration Hemoglobin  Date Value Ref Range Status  07/13/2017 11.2 (L) 12.0 - 15.0 g/dL Final  40/98/119108/29/2017 47.813.5 11.1 - 15.9 g/dL Final   HCT  Date Value Ref Range Status  07/13/2017 32.2 (L) 36.0 - 46.0 % Final   Hematocrit  Date Value Ref Range Status  11/30/2015 40.5 34.0 - 46.6 % Final    Physical Exam:  General: alert, cooperative and appears stated age 1Lochia: appropriate Uterine Fundus: firm Incision: healing well, no significant drainage, no dehiscence, no significant erythema DVT Evaluation: No evidence of DVT seen on physical exam. Negative Homan's sign. No cords or calf tenderness. No significant calf/ankle edema.  Discharge Diagnoses: Term Pregnancy-delivered  Discharge Information: Date: 07/13/2017 Activity: pelvic rest Diet: routine Medications: None Condition: stable Instructions: refer to practice specific booklet Discharge to: home   Newborn Data: Live born female  Birth Weight: 6 lb 14.9 oz (3144 g) APGAR: 9, 9  Newborn Delivery   Birth date/time:  07/12/2017 04:24:00 Delivery type:  Vaginal, Spontaneous     Home with mother.  Sarah Estes 07/13/2017, 9:50 AM
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
## 2017-07-17 ENCOUNTER — Inpatient Hospital Stay (HOSPITAL_COMMUNITY): Admission: RE | Admit: 2017-07-17 | Payer: BC Managed Care – PPO | Source: Ambulatory Visit

## 2017-07-29 ENCOUNTER — Telehealth (HOSPITAL_COMMUNITY): Payer: Self-pay | Admitting: Lactation Services

## 2017-07-29 NOTE — Telephone Encounter (Signed)
Mom called with concerns about her supply.  Baby is 53 days old and she is supplementing with formula because baby still acting hungry after breastfeeds.  She has a Spectra pump and pumps some.  The most she has obtained is 30 mls total.  Mom does have a history of gastric bypass surgery.  Discussed pumping every 2-3 hours and starting fenugreek or other herbal supplements.  Mom states her insurance won't cover lactation services.  Encouraged to call back if needed.

## 2017-08-20 ENCOUNTER — Other Ambulatory Visit: Payer: Self-pay

## 2017-08-20 ENCOUNTER — Ambulatory Visit (INDEPENDENT_AMBULATORY_CARE_PROVIDER_SITE_OTHER): Payer: BC Managed Care – PPO

## 2017-08-20 ENCOUNTER — Ambulatory Visit: Payer: BC Managed Care – PPO | Admitting: Family Medicine

## 2017-08-20 ENCOUNTER — Encounter: Payer: Self-pay | Admitting: Family Medicine

## 2017-08-20 VITALS — BP 100/66 | HR 72 | Temp 98.1°F | Ht 69.0 in | Wt 229.0 lb

## 2017-08-20 DIAGNOSIS — M79671 Pain in right foot: Secondary | ICD-10-CM

## 2017-08-20 DIAGNOSIS — S93601A Unspecified sprain of right foot, initial encounter: Secondary | ICD-10-CM | POA: Diagnosis not present
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
## 2017-08-20 NOTE — Patient Instructions (Addendum)
  There was no fracture seen on xray today, but due to pain at 5th foot bone, would recommend walking boot for the next 2 weeks (range of motion at ankle out of boot multiple times per day), then recheck with myself or your primary provider.   Return to the clinic or go to the nearest emergency room if any of your symptoms worsen or new symptoms occur.   Foot Pain Many things can cause foot pain. Some common causes are:  An injury.  A sprain.  Arthritis.  Blisters.  Bunions.  Follow these instructions at home: Pay attention to any changes in your symptoms. Take these actions to help with your discomfort:  If directed, put ice on the affected area: ? Put ice in a plastic bag. ? Place a towel between your skin and the bag. ? Leave the ice on for 15-20 minutes, 3?4 times a day for 2 days.  Take over-the-counter and prescription medicines only as told by your health care provider.  Wear comfortable, supportive shoes that fit you well. Do not wear high heels.  Do not stand or walk for long periods of time.  Do not lift a lot of weight. This can put added pressure on your feet.  Do stretches to relieve foot pain and stiffness as told by your health care provider.  Rub your foot gently.  Keep your feet clean and dry.  Contact a health care provider if:  Your pain does not get better after a few days of self-care.  Your pain gets worse.  You cannot stand on your foot. Get help right away if:  Your foot is numb or tingling.  Your foot or toes are swollen.  Your foot or toes turn white or blue.  You have warmth and redness along your foot. This information is not intended to replace advice given to you by your health care provider. Make sure you discuss any questions you have with your health care provider. Document Released: 04/16/2015 Document Revised: 08/26/2015 Document Reviewed: 04/15/2014 Elsevier Interactive Patient Education  2018 [employer].   IF you  received an x-ray today, you will receive an invoice from [facility]. Please contact [facility] at [phone] with questions or concerns regarding your invoice.   IF you received labwork today, you will receive an invoice from [employer]. Please contact [facility] at [phone] with questions or concerns regarding your invoice.   Our billing staff will not be able to assist you with questions regarding bills from these companies.  You will be contacted with the lab results as soon as they are available. The fastest way to get your results is to activate your My Chart account. Instructions are located on the last page of this paperwork. If you have not heard from [place] regarding the results in 2 weeks, please contact this office.

## 2017-09-24 ENCOUNTER — Encounter: Payer: Self-pay | Admitting: Physician Assistant

## 2017-09-24 ENCOUNTER — Other Ambulatory Visit: Payer: Self-pay

## 2017-09-24 ENCOUNTER — Ambulatory Visit: Payer: BC Managed Care – PPO | Admitting: Physician Assistant

## 2017-09-24 VITALS — BP 110/62 | HR 80 | Temp 98.1°F | Resp 18 | Ht 69.0 in | Wt 227.4 lb

## 2017-09-24 DIAGNOSIS — G43A0 Cyclical vomiting, not intractable: Secondary | ICD-10-CM

## 2017-09-24 DIAGNOSIS — J069 Acute upper respiratory infection, unspecified: Secondary | ICD-10-CM | POA: Diagnosis not present

## 2017-09-24 DIAGNOSIS — R1115 Cyclical vomiting syndrome unrelated to migraine: Secondary | ICD-10-CM

## 2017-09-24 DIAGNOSIS — R05 Cough: Secondary | ICD-10-CM

## 2017-09-24 DIAGNOSIS — R059 Cough, unspecified: Secondary | ICD-10-CM

## 2017-09-24 MED ORDER — HYDROCODONE-HOMATROPINE 5-1.5 MG/5ML PO SYRP: 5.0000 mL | ORAL_SOLUTION | Freq: Three times a day (TID) | ORAL | 0 refills | Status: DC | PRN

## 2017-09-24 MED ORDER — GUAIFENESIN ER 1200 MG PO TB12: 1.0000 | ORAL_TABLET | Freq: Two times a day (BID) | ORAL | 0 refills | Status: AC

## 2017-09-24 NOTE — Patient Instructions (Addendum)
  Please push fluids.  Tylenol and Motrin for fever and body aches.    A humidifier can help especially when the air is dry -if you do not have a humidifier you can boil a pot of water on the stove in your home to help with the dry air.  Nasal saline spray can be helpful to keep the mucus membranes moist and thin the nasal mucus    IF you received an x-ray today, you will receive an invoice from [facility]. Please contact [facility] at [phone] with questions or concerns regarding your invoice.   IF you received labwork today, you will receive an invoice from [facility]. Please contact [facility] at [phone] with questions or concerns regarding your invoice.   Our billing staff will not be able to assist you with questions regarding bills from these companies.  You will be contacted with the lab results as soon as they are available. The fastest way to get your results is to activate your My Chart account. Instructions are located on the last page of this paperwork. If you have not heard from us regarding the results in 2 weeks, please contact this office.      
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
## 2019-02-12 IMAGING — DX DG FOOT COMPLETE 3+V*R*
3 series · 3 of 3 positions shown · non-contrast
Comparison: None.

CLINICAL DATA: Acute right foot pain after fall last week.

EXAM:
RIGHT FOOT COMPLETE - 3+ VIEW

[foot ap]
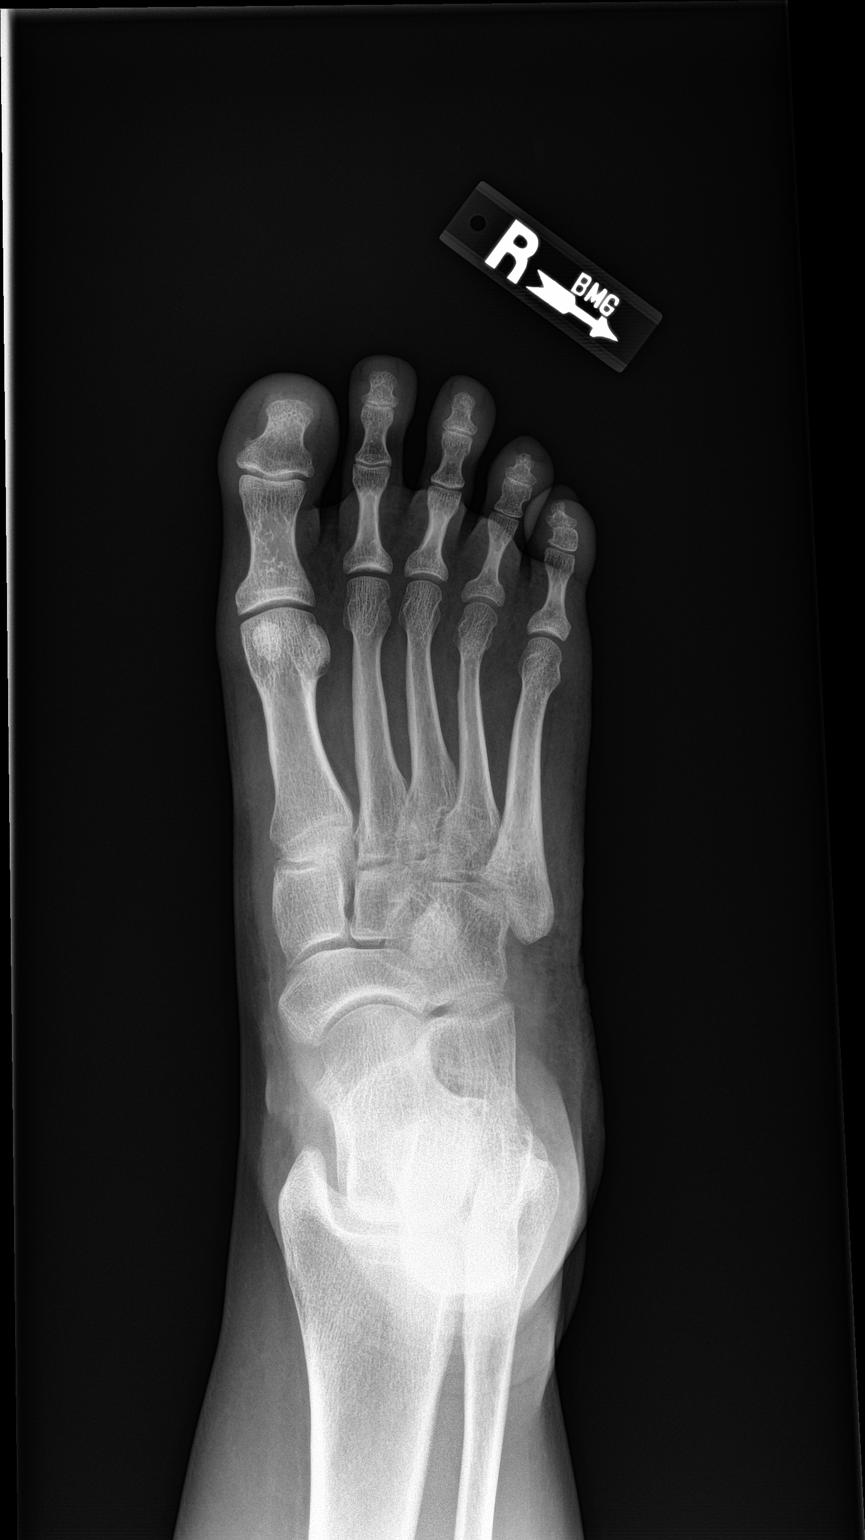

[foot obl]
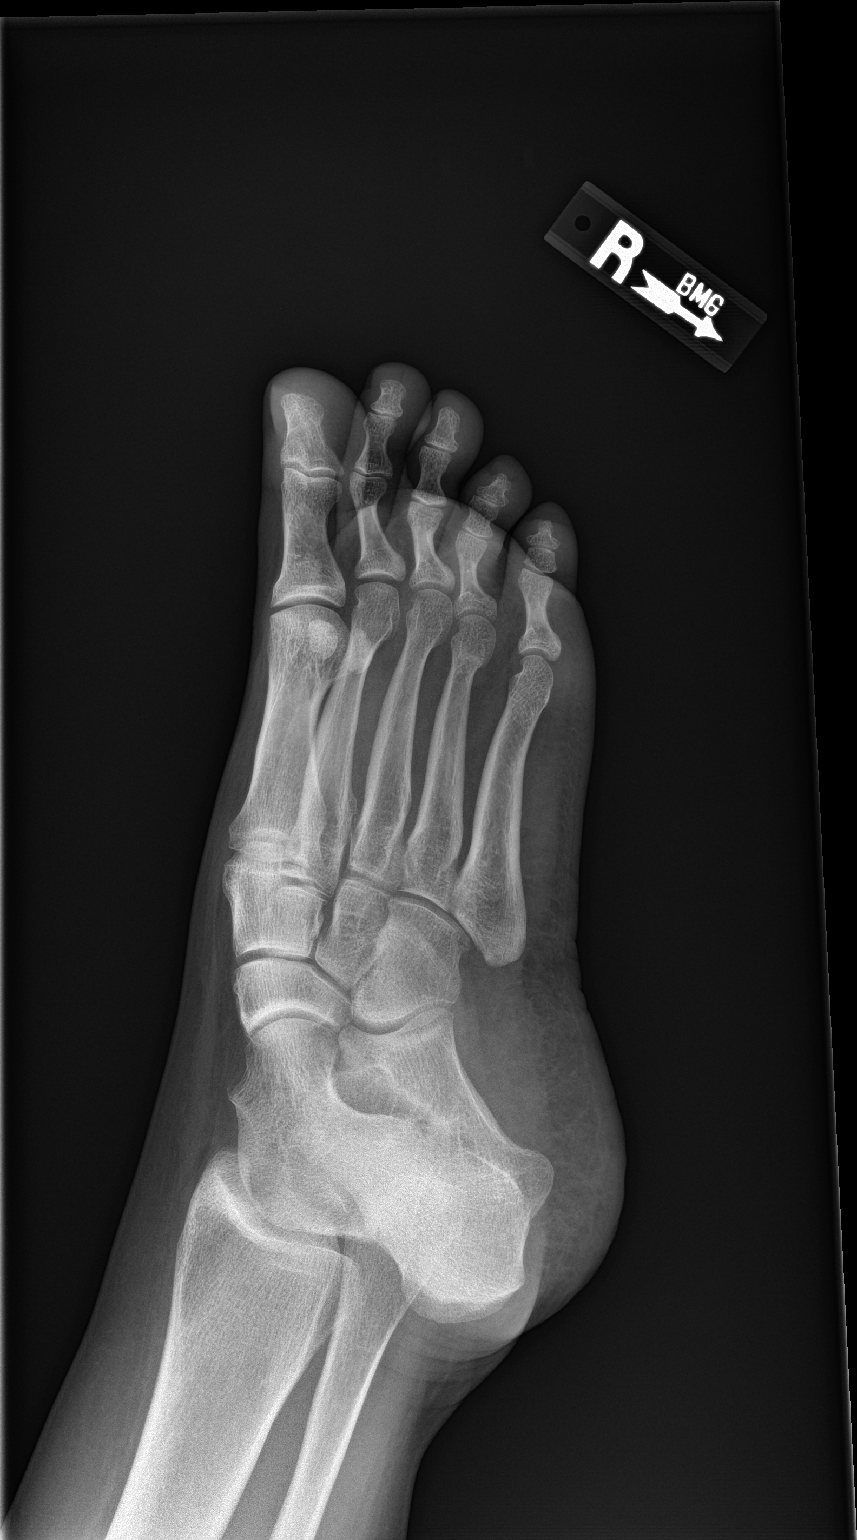

[foot lat]
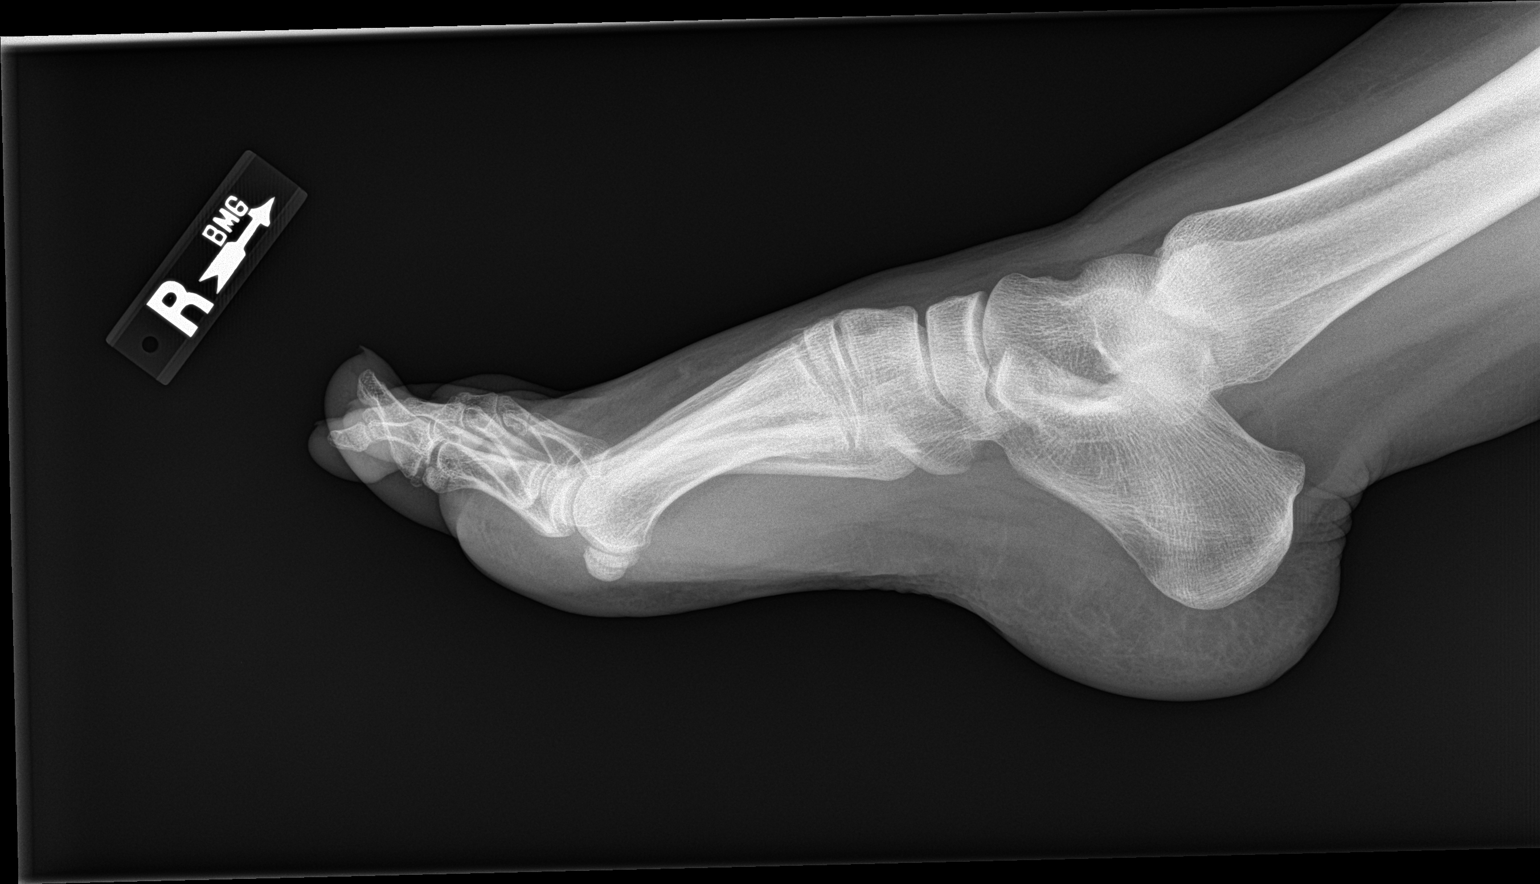

[3 of 3 positions shown; findings below may reference images not displayed]

FINDINGS: There is no evidence of fracture or dislocation. There is no
evidence of arthropathy or other focal bone abnormality. Soft
tissues are unremarkable.
IMPRESSION: Normal right foot.

## 2021-02-04 ENCOUNTER — Inpatient Hospital Stay (HOSPITAL_COMMUNITY): Payer: Commercial Managed Care - PPO

## 2021-02-04 ENCOUNTER — Other Ambulatory Visit: Payer: Self-pay

## 2021-02-04 ENCOUNTER — Encounter (HOSPITAL_COMMUNITY): Payer: Self-pay | Admitting: Obstetrics & Gynecology

## 2021-02-04 ENCOUNTER — Inpatient Hospital Stay (HOSPITAL_COMMUNITY)
Admission: AD | Admit: 2021-02-04 | Discharge: 2021-02-04 | Disposition: A | Discharge status: Home / Self Care | Payer: Commercial Managed Care - PPO | Attending: Obstetrics & Gynecology | Admitting: Obstetrics & Gynecology

## 2021-02-04 DIAGNOSIS — Z886 Allergy status to analgesic agent status: Secondary | ICD-10-CM | POA: Diagnosis not present

## 2021-02-04 DIAGNOSIS — O468X1 Other antepartum hemorrhage, first trimester: Secondary | ICD-10-CM

## 2021-02-04 DIAGNOSIS — Z882 Allergy status to sulfonamides status: Secondary | ICD-10-CM | POA: Diagnosis not present

## 2021-02-04 DIAGNOSIS — O208 Other hemorrhage in early pregnancy: Secondary | ICD-10-CM | POA: Insufficient documentation

## 2021-02-04 DIAGNOSIS — O209 Hemorrhage in early pregnancy, unspecified: Secondary | ICD-10-CM

## 2021-02-04 DIAGNOSIS — Z885 Allergy status to narcotic agent status: Secondary | ICD-10-CM | POA: Insufficient documentation

## 2021-02-04 DIAGNOSIS — Z3A01 Less than 8 weeks gestation of pregnancy: Secondary | ICD-10-CM | POA: Diagnosis not present

## 2021-02-04 LAB — CBC
HCT: 34.4 % — ABNORMAL LOW (ref 36.0–46.0)
Hemoglobin: 11.6 g/dL — ABNORMAL LOW (ref 12.0–15.0)
MCH: 27.7 pg (ref 26.0–34.0)
MCHC: 33.7 g/dL (ref 30.0–36.0)
MCV: 82.1 fL (ref 80.0–100.0)
Platelets: 342 10*3/uL (ref 150–400)
RBC: 4.19 MIL/uL (ref 3.87–5.11)
RDW: 15.5 % (ref 11.5–15.5)
WBC: 9 10*3/uL (ref 4.0–10.5)
nRBC: 0 % (ref 0.0–0.2)

## 2021-02-04 LAB — OB RESULTS CONSOLE ABO/RH: RH Type: POSITIVE

## 2021-02-04 LAB — URINALYSIS, ROUTINE W REFLEX MICROSCOPIC
Bilirubin Urine: NEGATIVE
Glucose, UA: NEGATIVE mg/dL
Hgb urine dipstick: NEGATIVE
Ketones, ur: NEGATIVE mg/dL
Leukocytes,Ua: NEGATIVE
Nitrite: NEGATIVE
Protein, ur: NEGATIVE mg/dL
Specific Gravity, Urine: 1.003 — ABNORMAL LOW (ref 1.005–1.030)
pH: 6 (ref 5.0–8.0)

## 2021-02-04 LAB — ABO/RH: ABO/RH(D): A POS

## 2021-02-04 LAB — HIV ANTIBODY (ROUTINE TESTING W REFLEX): HIV Screen 4th Generation wRfx: NONREACTIVE

## 2021-02-04 LAB — WET PREP, GENITAL
Clue Cells Wet Prep HPF POC: NONE SEEN
Sperm: NONE SEEN
Trich, Wet Prep: NONE SEEN
Yeast Wet Prep HPF POC: NONE SEEN

## 2021-02-04 LAB — HCG, QUANTITATIVE, PREGNANCY: hCG, Beta Chain, Quant, S: 32159 m[IU]/mL — ABNORMAL HIGH (ref <5)

## 2021-02-04 LAB — POCT PREGNANCY, URINE: Preg Test, Ur: POSITIVE — AB
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
## 2021-02-04 NOTE — MAU Note (Signed)
Is approx 6-7 wks preg.  Yesterday she had some brown spotting, today it is red, sees it when she wipes. Not able to get into OB for 3 wks. Was confirmed at an UC.  Denies bleeding

## 2021-02-05 LAB — RPR: RPR Ser Ql: NONREACTIVE

## 2021-02-07 LAB — GC/CHLAMYDIA PROBE AMP (~~LOC~~) NOT AT ARMC
Chlamydia: NEGATIVE
Comment: NEGATIVE
Comment: NORMAL
Neisseria Gonorrhea: NEGATIVE

## 2021-02-22 ENCOUNTER — Other Ambulatory Visit: Payer: BC Managed Care – PPO

## 2021-03-15 ENCOUNTER — Other Ambulatory Visit: Payer: Self-pay | Admitting: Obstetrics & Gynecology

## 2021-03-15 DIAGNOSIS — Z3682 Encounter for antenatal screening for nuchal translucency: Secondary | ICD-10-CM

## 2021-03-18 ENCOUNTER — Ambulatory Visit (INDEPENDENT_AMBULATORY_CARE_PROVIDER_SITE_OTHER): Payer: Commercial Managed Care - PPO

## 2021-03-18 ENCOUNTER — Other Ambulatory Visit: Payer: Commercial Managed Care - PPO

## 2021-03-18 ENCOUNTER — Other Ambulatory Visit: Payer: Self-pay

## 2021-03-18 DIAGNOSIS — Z3682 Encounter for antenatal screening for nuchal translucency: Secondary | ICD-10-CM | POA: Diagnosis not present

## 2021-03-18 DIAGNOSIS — Z3A12 12 weeks gestation of pregnancy: Secondary | ICD-10-CM | POA: Diagnosis not present

## 2021-03-18 DIAGNOSIS — Z3490 Encounter for supervision of normal pregnancy, unspecified, unspecified trimester: Secondary | ICD-10-CM | POA: Insufficient documentation

## 2021-03-18 DIAGNOSIS — O0990 Supervision of high risk pregnancy, unspecified, unspecified trimester: Secondary | ICD-10-CM | POA: Insufficient documentation

## 2021-03-21 ENCOUNTER — Encounter: Payer: Self-pay | Admitting: Women's Health

## 2021-03-21 ENCOUNTER — Other Ambulatory Visit (HOSPITAL_COMMUNITY)
Admission: RE | Admit: 2021-03-21 | Discharge: 2021-03-21 | Disposition: A | Discharge status: Home / Self Care | Payer: Commercial Managed Care - PPO | Source: Ambulatory Visit | Attending: Women's Health | Admitting: Women's Health

## 2021-03-21 ENCOUNTER — Other Ambulatory Visit: Payer: Self-pay

## 2021-03-21 ENCOUNTER — Ambulatory Visit: Payer: Commercial Managed Care - PPO | Admitting: *Deleted

## 2021-03-21 ENCOUNTER — Ambulatory Visit (INDEPENDENT_AMBULATORY_CARE_PROVIDER_SITE_OTHER): Payer: Commercial Managed Care - PPO | Admitting: Women's Health

## 2021-03-21 VITALS — BP 125/74 | HR 84 | Wt 224.4 lb

## 2021-03-21 DIAGNOSIS — Z23 Encounter for immunization: Secondary | ICD-10-CM

## 2021-03-21 DIAGNOSIS — Z3481 Encounter for supervision of other normal pregnancy, first trimester: Secondary | ICD-10-CM | POA: Diagnosis not present

## 2021-03-21 DIAGNOSIS — Z3480 Encounter for supervision of other normal pregnancy, unspecified trimester: Secondary | ICD-10-CM | POA: Diagnosis not present

## 2021-03-21 DIAGNOSIS — Z3490 Encounter for supervision of normal pregnancy, unspecified, unspecified trimester: Secondary | ICD-10-CM | POA: Diagnosis not present

## 2021-03-21 LAB — INTEGRATED 1
Crown Rump Length: 58 mm
Gest. Age on Collection Date: 12.1 weeks
Maternal Age at EDD: 29 yr
Nuchal Translucency (NT): 1.4 mm
Number of Fetuses: 1
PAPP-A Value: 684.5 ng/mL
Weight: 226 [lb_av]

## 2021-03-21 LAB — POCT URINALYSIS DIPSTICK OB
Blood, UA: NEGATIVE
Glucose, UA: NEGATIVE
Leukocytes, UA: NEGATIVE
Nitrite, UA: NEGATIVE

## 2021-03-21 LAB — HEPATITIS C ANTIBODY: HCV Ab: NEGATIVE

## 2021-03-21 NOTE — Progress Notes (Signed)
INITIAL OBSTETRICAL VISIT Patient name: Sarah Estes MRN [id number]  Date of birth: Jun 17, 1990 Chief Complaint:   Initial Prenatal Visit  History of Present Illness:   Sarah Estes is a 30 y.o. G43P1011 Caucasian female at [redacted]w[redacted]d by [place] at 6 weeks with an Estimated Date of Delivery: 09/29/21 being seen today for her initial obstetrical visit.   No LMP recorded (lmp unknown). Patient is pregnant. Her obstetrical history is significant for  SAB x 1, term uncomplicated SVB x 1 .   H/O Roux-en-Y 2014 Today she reports  nausea, throws up once at night, declines meds .  Last pap 2018. Results were:  abnormal, but then biopsy always comes back normal  Depression screen [facility] 2/9 03/21/2021 09/24/2017 08/20/2017 09/26/2016 08/22/2016  Decreased Interest 0 0 0 0 3  Down, Depressed, Hopeless 0 0 0 0 2  PHQ - 2 Score 0 0 0 0 5  Altered sleeping 0 - - - 3  Tired, decreased energy 0 - - - 3  Change in appetite 0 - - - 3  Feeling bad or failure about yourself  0 - - - 3  Trouble concentrating 0 - - - 3  Moving slowly or fidgety/restless 0 - - - 1  Suicidal thoughts 0 - - - 0  PHQ-9 Score 0 - - - 21  Difficult doing work/chores - - - - Extremely dIfficult     GAD 7 : Generalized Anxiety Score 03/21/2021  Nervous, Anxious, on Edge 1  Control/stop worrying 1  Worry too much - different things 1  Trouble relaxing 2  Restless 1  Easily annoyed or irritable 3  Afraid - awful might happen 0  Total GAD 7 Score 9     Review of Systems:   Pertinent items are noted in HPI Denies cramping/contractions, leakage of fluid, vaginal bleeding, abnormal vaginal discharge w/ itching/odor/irritation, headaches, visual changes, shortness of breath, chest pain, abdominal pain, severe nausea/vomiting, or problems with urination or bowel movements unless otherwise stated above.  Pertinent History Reviewed:  Reviewed past medical,surgical, social, obstetrical and family history.  Reviewed problem list,  medications and allergies. OB History  Gravida Para Term Preterm AB Living  3 1 1   1 1   SAB IAB Ectopic Multiple Live Births  1       1    # Outcome Date GA Lbr Len/2nd Weight Sex Delivery Anes PTL Lv  3 Current           2 SAB           1 Term            Physical Assessment:   Vitals:   03/21/21 1455  BP: 125/74  Pulse: 84  Weight: 224 lb 6.4 oz (101.8 kg)  Body mass index is 33.14 kg/m.       Physical Examination:  General appearance - well appearing, and in no distress  Mental status - alert, oriented to person, place, and time  Psych:  She has a normal mood and affect  Skin - warm and dry, normal color, no suspicious lesions noted  Chest - effort normal, all lung fields clear to auscultation bilaterally  Heart - normal rate and regular rhythm  Abdomen - soft, nontender  Extremities:  No swelling or varicosities noted  Pelvic - VULVA: normal appearing vulva with no masses, tenderness or lesions  VAGINA: normal appearing vagina with normal color and discharge, no lesions  CERVIX: normal appearing cervix without discharge or lesions,  no CMT  Thin prep pap is done w/ HR HPV cotesting  Chaperone: Angel Neas    TODAY'S FHT via doppler: 164  Results for orders placed or performed in visit on 03/21/21 (from the past 24 hour(s))  POC Urinalysis Dipstick OB   Collection Time: 03/21/21  2:47 PM  Result Value Ref Range   Color, UA     Clarity, UA     Glucose, UA Negative Negative   Bilirubin, UA     Ketones, UA trace    Spec Grav, UA     Blood, UA neg    pH, UA     POC,PROTEIN,UA Trace Negative, Trace, Small (1+), Moderate (2+), Large (3+), 4+   Urobilinogen, UA     Nitrite, UA neg    Leukocytes, UA Negative Negative   Appearance     Odor      Assessment & Plan:  1) Low-Risk Pregnancy G3P1011 at [redacted]w[redacted]d with an Estimated Date of Delivery: 09/29/21   2) Initial OB visit  3) H/O Roux-en-Y bypass 29yrs ago  4) N/V>declines meds, let us know if changes mind  Meds:  No orders of the defined types were placed in this encounter.   Initial labs obtained Continue prenatal vitamins Reviewed n/v relief measures and warning s/s to report Reviewed recommended weight gain based on pre-gravid BMI Encouraged well-balanced diet Genetic & carrier screening discussed: requests Panorama, NT/IT, and Horizon  Ultrasound discussed; fetal survey: requested CCNC completed> form faxed if has or is planning to apply for medicaid The nature of [facility] [employer] with multiple MDs and other Advanced Practice Providers was explained to patient; also emphasized that fellows, residents, and students are part of our team.  Follow-up: Return in about 3 weeks (around 04/11/2021) for LROB, 2nd IT, CNM, in person.   Orders Placed This Encounter  Procedures   Urine Culture   Pain Management Screening Profile (10S)   Hepatitis B surface antigen   Hepatitis C antibody   POC Urinalysis Dipstick OB    Cheral Marker CNM, [facility] 03/21/2021 3:28 PM

## 2021-03-21 NOTE — Patient Instructions (Signed)
Sarah Estes, thank you for choosing our office today! We appreciate the opportunity to meet your healthcare needs. You may receive a short survey by mail, e-mail, or through MyChart. If you are happy with your care we would appreciate if you could take just a few minutes to complete the survey questions. We read all of your comments and take your feedback very seriously. Thank you again for choosing our office.  °[facility] ° [facility] °([address] [place], [place] [address]) °Entrance C, located off of [address] °Free 24/7 valet parking  ° Nausea & Vomiting °Have saltine crackers or pretzels by your bed and eat a few bites before you raise your head out of bed in the morning °Eat small frequent meals throughout the day instead of large meals °Drink plenty of fluids throughout the day to stay hydrated, just don't drink a lot of fluids with your meals.  This can make your stomach fill up faster making you feel sick °Do not brush your teeth right after you eat °Products with real ginger are good for nausea, like ginger ale and ginger hard candy Make sure it says made with real ginger! °Sucking on sour candy like lemon heads is also good for nausea °If your prenatal vitamins make you nauseated, take them at night so you will sleep through the nausea °Sea Bands °If you feel like you need medicine for the nausea & vomiting please let us know °If you are unable to keep any fluids or food down please let us know ° ° Constipation °Drink plenty of fluid, preferably water, throughout the day °Eat foods high in fiber such as fruits, vegetables, and grains °Exercise, such as walking, is a good way to keep your bowels regular °Drink warm fluids, especially warm prune juice, or decaf coffee °Eat a 1/2 cup of real oatmeal (not instant), 1/2 cup applesauce, and 1/2-1 cup warm prune juice every day °If needed, you may take Colace (docusate sodium) stool  softener once or twice a day to help keep the stool soft.  °If you still are having problems with constipation, you may take Miralax once daily as needed to help keep your bowels regular.  ° °Home Blood Pressure Monitoring for Patients  ° °Your provider has recommended that you check your blood pressure (BP) at least once a week at home. If you do not have a blood pressure cuff at home, one will be provided for you. Contact your provider if you have not received your monitor within 1 week.  ° °Helpful Tips for Accurate Home Blood Pressure Checks  °Don't smoke, exercise, or drink caffeine 30 minutes before checking your BP °Use the restroom before checking your BP (a full bladder can raise your pressure) °Relax in a comfortable upright chair °Feet on the ground °Left arm resting comfortably on a flat surface at the level of your heart °Legs uncrossed °Back supported °Sit quietly and don't talk °Place the cuff on your bare arm °Adjust snuggly, so that only two fingertips can fit between your skin and the top of the cuff °Check 2 readings separated by at least one minute °Keep a log of your BP readings °For a visual, please reference this diagram: http://ccnc.care/bpdiagram ° °Provider Name: [facility]     Phone: [phone] ° °Zone 1: ALL CLEAR  °Continue to monitor your symptoms:  °BP reading is less than 140 (top number) or less than 90 (bottom   number)  °No right upper stomach pain °No headaches or seeing spots °No feeling nauseated or throwing up °No swelling in face and hands ° °Zone 2: CAUTION °Call your doctor's office for any of the following:  °BP reading is greater than 140 (top number) or greater than 90 (bottom number)  °Stomach pain under your ribs in the middle or right side °Headaches or seeing spots °Feeling nauseated or throwing up °Swelling in face and hands ° °Zone 3: EMERGENCY  °Seek immediate medical care if you have any of the following:  °BP reading is greater than160 (top number) or  greater than 110 (bottom number) °Severe headaches not improving with Tylenol °Serious difficulty catching your breath °Any worsening symptoms from Zone 2  ° ° First Trimester of Pregnancy °The first trimester of pregnancy is from week 1 until the end of week 12 (months 1 through 3). A week after a sperm fertilizes an egg, the egg will implant on the wall of the uterus. This embryo will begin to develop into a baby. Genes from you and your partner are forming the baby. The female genes determine whether the baby is a boy or a girl. At 6-8 weeks, the eyes and face are formed, and the heartbeat can be seen on ultrasound. At the end of 12 weeks, all the baby's organs are formed.  °Now that you are pregnant, you will want to do everything you can to have a healthy baby. Two of the most important things are to get good prenatal care and to follow your health care provider's instructions. Prenatal care is all the medical care you receive before the baby's birth. This care will help prevent, find, and treat any problems during the pregnancy and childbirth. °BODY CHANGES °Your body goes through many changes during pregnancy. The changes vary from woman to woman.  °You may gain or lose a couple of pounds at first. °You may feel sick to your stomach (nauseous) and throw up (vomit). If the vomiting is uncontrollable, call your health care provider. °You may tire easily. °You may develop headaches that can be relieved by medicines approved by your health care provider. °You may urinate more often. Painful urination may mean you have a bladder infection. °You may develop heartburn as a result of your pregnancy. °You may develop constipation because certain hormones are causing the muscles that push waste through your intestines to slow down. °You may develop hemorrhoids or swollen, bulging veins (varicose veins). °Your breasts may begin to grow larger and become tender. Your nipples may stick out more, and the tissue that  surrounds them (areola) may become darker. °Your gums may bleed and may be sensitive to brushing and flossing. °Dark spots or blotches (chloasma, mask of pregnancy) may develop on your face. This will likely fade after the baby is born. °Your menstrual periods will stop. °You may have a loss of appetite. °You may develop cravings for certain kinds of food. °You may have changes in your emotions from day to day, such as being excited to be pregnant or being concerned that something may go wrong with the pregnancy and baby. °You may have more vivid and strange dreams. °You may have changes in your hair. These can include thickening of your hair, rapid growth, and changes in texture. Some women also have hair loss during or after pregnancy, or hair that feels dry or thin. Your hair will most likely return to normal after your baby is born. °WHAT TO EXPECT AT YOUR PRENATAL   VISITS °During a routine prenatal visit: °You will be weighed to make sure you and the baby are growing normally. °Your blood pressure will be taken. °Your abdomen will be measured to track your baby's growth. °The fetal heartbeat will be listened to starting around week 10 or 12 of your pregnancy. °Test results from any previous visits will be discussed. °Your health care provider may ask you: °How you are feeling. °If you are feeling the baby move. °If you have had any abnormal symptoms, such as leaking fluid, bleeding, severe headaches, or abdominal cramping. °If you have any questions. °Other tests that may be performed during your first trimester include: °Blood tests to find your blood type and to check for the presence of any previous infections. They will also be used to check for low iron levels (anemia) and Rh antibodies. Later in the pregnancy, blood tests for diabetes will be done along with other tests if problems develop. °Urine tests to check for infections, diabetes, or protein in the urine. °An ultrasound to confirm the proper growth  and development of the baby. °An amniocentesis to check for possible genetic problems. °Fetal screens for spina bifida and Down syndrome. °You may need other tests to make sure you and the baby are doing well. °HOME CARE INSTRUCTIONS  °Medicines °Follow your health care provider's instructions regarding medicine use. Specific medicines may be either safe or unsafe to take during pregnancy. °Take your prenatal vitamins as directed. °If you develop constipation, try taking a stool softener if your health care provider approves. °Diet °Eat regular, well-balanced meals. Choose a variety of foods, such as meat or vegetable-based protein, fish, milk and low-fat dairy products, vegetables, fruits, and whole grain breads and cereals. Your health care provider will help you determine the amount of weight gain that is right for you. °Avoid raw meat and uncooked cheese. These carry germs that can cause birth defects in the baby. °Eating four or five small meals rather than three large meals a day may help relieve nausea and vomiting. If you start to feel nauseous, eating a few soda crackers can be helpful. Drinking liquids between meals instead of during meals also seems to help nausea and vomiting. °If you develop constipation, eat more high-fiber foods, such as fresh vegetables or fruit and whole grains. Drink enough fluids to keep your urine clear or pale yellow. °Activity and Exercise °Exercise only as directed by your health care provider. Exercising will help you: °Control your weight. °Stay in shape. °Be prepared for labor and delivery. °Experiencing pain or cramping in the lower abdomen or low back is a good sign that you should stop exercising. Check with your health care provider before continuing normal exercises. °Try to avoid standing for long periods of time. Move your legs often if you must stand in one place for a long time. °Avoid heavy lifting. °Wear low-heeled shoes, and practice good posture. °You may  continue to have sex unless your health care provider directs you otherwise. °Relief of Pain or Discomfort °Wear a good support bra for breast tenderness.   °Take warm sitz baths to soothe any pain or discomfort caused by hemorrhoids. Use hemorrhoid cream if your health care provider approves.   °Rest with your legs elevated if you have leg cramps or low back pain. °If you develop varicose veins in your legs, wear support hose. Elevate your feet for 15 minutes, 3-4 times a day. Limit salt in your diet. °Prenatal Care °Schedule your prenatal visits by the   twelfth week of pregnancy. They are usually scheduled monthly at first, then more often in the last 2 months before delivery. °Write down your questions. Take them to your prenatal visits. °Keep all your prenatal visits as directed by your health care provider. °Safety °Wear your seat belt at all times when driving. °Make a list of emergency phone numbers, including numbers for family, friends, the hospital, and police and fire departments. °General Tips °Ask your health care provider for a referral to a local prenatal education class. Begin classes no later than at the beginning of month 6 of your pregnancy. °Ask for help if you have counseling or nutritional needs during pregnancy. Your health care provider can offer advice or refer you to specialists for help with various needs. °Do not use hot tubs, steam rooms, or saunas. °Do not douche or use tampons or scented sanitary pads. °Do not cross your legs for long periods of time. °Avoid cat litter boxes and soil used by cats. These carry germs that can cause birth defects in the baby and possibly loss of the fetus by miscarriage or stillbirth. °Avoid all smoking, herbs, alcohol, and medicines not prescribed by your health care provider. Chemicals in these affect the formation and growth of the baby. °Schedule a dentist appointment. At home, brush your teeth with a soft toothbrush and be gentle when you floss. °SEEK  MEDICAL CARE IF:  °You have dizziness. °You have mild pelvic cramps, pelvic pressure, or nagging pain in the abdominal area. °You have persistent nausea, vomiting, or diarrhea. °You have a bad smelling vaginal discharge. °You have pain with urination. °You notice increased swelling in your face, hands, legs, or ankles. °SEEK IMMEDIATE MEDICAL CARE IF:  °You have a fever. °You are leaking fluid from your vagina. °You have spotting or bleeding from your vagina. °You have severe abdominal cramping or pain. °You have rapid weight gain or loss. °You vomit blood or material that looks like coffee grounds. °You are exposed to German measles and have never had them. °You are exposed to fifth disease or chickenpox. °You develop a severe headache. °You have shortness of breath. °You have any kind of trauma, such as from a fall or a car accident. °Document Released: 03/14/2001 Document Revised: 08/04/2013 Document Reviewed: 01/28/2013 °ExitCare® Patient Information ©2015 [employer]. This information is not intended to replace advice given to you by your health care provider. Make sure you discuss any questions you have with your health care provider.  °

## 2021-03-22 LAB — HEPATITIS B SURFACE ANTIGEN: Hepatitis B Surface Ag: NEGATIVE

## 2021-03-22 LAB — HEPATITIS C ANTIBODY: Hep C Virus Ab: <0.1 s/co ratio (ref 0.0–0.9)

## 2021-03-23 LAB — PMP SCREEN PROFILE (10S), URINE
Amphetamine Scrn, Ur: NEGATIVE ng/mL
BARBITURATE SCREEN URINE: NEGATIVE ng/mL
BENZODIAZEPINE SCREEN, URINE: NEGATIVE ng/mL
CANNABINOIDS UR QL SCN: NEGATIVE ng/mL
Cocaine (Metab) Scrn, Ur: NEGATIVE ng/mL
Creatinine(Crt), U: 274.1 mg/dL (ref 20.0–300.0)
Methadone Screen, Urine: NEGATIVE ng/mL
OXYCODONE+OXYMORPHONE UR QL SCN: NEGATIVE ng/mL
Opiate Scrn, Ur: NEGATIVE ng/mL
Ph of Urine: 5.4 (ref 4.5–8.9)
Phencyclidine Qn, Ur: NEGATIVE ng/mL
Propoxyphene Scrn, Ur: NEGATIVE ng/mL

## 2021-03-23 LAB — CYTOLOGY - PAP
Chlamydia: NEGATIVE
Comment: NEGATIVE
Comment: NEGATIVE
Comment: NORMAL
Diagnosis: NEGATIVE
High risk HPV: NEGATIVE
Neisseria Gonorrhea: NEGATIVE

## 2021-03-25 LAB — URINE CULTURE

## 2021-03-31 ENCOUNTER — Encounter: Payer: Self-pay | Admitting: Women's Health

## 2021-04-03 NOTE — L&D Delivery Note (Cosign Needed Addendum)
LABOR COURSE Patient was admitted at [redacted]w[redacted]d for IOL due to A1GDM. At admission patient was 1/40/-3. Patient received Cytotec x 2 and SROM occurred shortly afer. After 4 hours, patient was started on Pitocin. She quickly progressed to complete and 3+ and had a SVD. At time of delivery, patient had received PCN X 3 doses due to GBS + status.   Delivery Note Called to room and patient was complete and pushing. Deep variable decelerations with pushing noted with FHR in the 50s, recovered to about 150s in between. Mom pushing well but tight perineal tissue. After 3 contractions, perineal tissue stretched appropriately and the head delivered OA. Loose nuchal x 1 was present and delivered though. Shoulders and body delivered. Cord reduced after delivery. Infant initially had poor tone and weak cry, therefore, the cord was clamped and cut after 20-30 seconds to allow for further resuscitation at the warmer. Infant responded quickly to bulb suction and tactile stimuli. Cord blood and cord gas drawn. Placenta delivered spontaneously with gentle cord traction. Fundus firm with massage and Pitocin. Labia, perineum, vagina, and cervix inspected and patient was found to have a 2nd degree perineal laceration and bilateral labial abrasions. The 2nd degree perineal laceration was repaired with 2-0 Vicryl and found to be hemostatic. The bilateral labial abrasions were hemostatic and not repaired.   APGAR: 7/9 ; Weight 3380g.    Cord: 3VC with the following complications: None.   Cord pH: Drawn and sent to lab. Pending.  Anesthesia: Epidural Episiotomy: None Lacerations: Bilateral labial abrasions, 2nd degree perineal laceration Suture Repair: 2-0 Vicryl  Est. Blood Loss (mL): 150 cc  Mom to postpartum.  Baby to Couplet care / Skin to Skin.  Gilles Chiquito, MD PGY-2  GME ATTESTATION:  I saw and evaluated the patient. I was gowned, gloved, and present for the entire delivery and management of the patient. I agree  with the findings and the plan of care as documented in the resident's note. I have made changes to documentation as necessary.  Arterial cord gas collected at time of delivery. Processed over 2 hours after collection.   Evalina Field, MD OB Fellow, Faculty Practice [facility], [facility]

## 2021-04-05 ENCOUNTER — Encounter: Payer: Self-pay | Admitting: Women's Health

## 2021-04-11 ENCOUNTER — Other Ambulatory Visit: Payer: Self-pay

## 2021-04-11 ENCOUNTER — Encounter: Payer: Self-pay | Admitting: Obstetrics & Gynecology

## 2021-04-11 ENCOUNTER — Ambulatory Visit (INDEPENDENT_AMBULATORY_CARE_PROVIDER_SITE_OTHER): Payer: Commercial Managed Care - PPO | Admitting: Obstetrics & Gynecology

## 2021-04-11 VITALS — BP 111/76 | HR 84 | Wt 225.0 lb

## 2021-04-11 DIAGNOSIS — Z3492 Encounter for supervision of normal pregnancy, unspecified, second trimester: Secondary | ICD-10-CM

## 2021-04-11 DIAGNOSIS — Z1379 Encounter for other screening for genetic and chromosomal anomalies: Secondary | ICD-10-CM

## 2021-04-11 DIAGNOSIS — Z3A15 15 weeks gestation of pregnancy: Secondary | ICD-10-CM

## 2021-04-11 NOTE — Progress Notes (Signed)
° °  LOW-RISK PREGNANCY VISIT Patient name: Sarah Estes MRN [id number]  Date of birth: [birth year] Chief Complaint:   Routine Prenatal Visit (2nd IT)  History of Present Illness:   Sarah Estes is a 31 y.o. G60P1011 female at [redacted]w[redacted]d with an Estimated Date of Delivery: 09/29/21 being seen today for ongoing management of a low-risk pregnancy.  Depression screen [facility] 2/9 03/21/2021 09/24/2017 08/20/2017 09/26/2016 08/22/2016  Decreased Interest 0 0 0 0 3  Down, Depressed, Hopeless 0 0 0 0 2  PHQ - 2 Score 0 0 0 0 5  Altered sleeping 0 - - - 3  Tired, decreased energy 0 - - - 3  Change in appetite 0 - - - 3  Feeling bad or failure about yourself  0 - - - 3  Trouble concentrating 0 - - - 3  Moving slowly or fidgety/restless 0 - - - 1  Suicidal thoughts 0 - - - 0  PHQ-9 Score 0 - - - 21  Difficult doing work/chores - - - - Extremely dIfficult    [name].  . Vag. Bleeding: None.  Movement: Absent. denies leaking of fluid. Review of Systems:   Pertinent items are noted in HPI Denies abnormal vaginal discharge w/ itching/odor/irritation, headaches, visual changes, shortness of breath, chest pain, abdominal pain, severe nausea/vomiting, or problems with urination or bowel movements unless otherwise stated above. Pertinent History Reviewed:  Reviewed past medical,surgical, social, obstetrical and family history.  Reviewed problem list, medications and allergies. Physical Assessment:   Vitals:   04/11/21 1533  BP: 111/76  Pulse: 84  Weight: 225 lb (102.1 kg)  Body mass index is 33.23 kg/m.        Physical Examination:   General appearance: Well appearing, and in no distress  Mental status: Alert, oriented to person, place, and time  Skin: Warm & dry  Cardiovascular: Normal heart rate noted  Respiratory: Normal respiratory effort, no distress  Abdomen: Soft, gravid, nontender  Pelvic: Cervical exam deferred         Extremities: Edema: None  Fetal Status:  Fetal Heart Rate (bpm): 160   Movement: Absent    Chaperone: n/a    No results found for this or any previous visit (from the past 24 hour(s)).  Assessment & Plan:  1) Low-risk pregnancy G3P1011 at [redacted]w[redacted]d with an Estimated Date of Delivery: 09/29/21      Meds: No orders of the defined types were placed in this encounter.  Labs/procedures today: IT2  Plan:  Continue routine obstetrical care  Next visit: prefers in person       Follow-up: Return in about 4 weeks (around 05/09/2021) for 20 week sono, LROB.  Orders Placed This Encounter  Procedures   US OB Comp + 14 Wk   INTEGRATED 2    Florian Buff, MD 04/11/2021 4:15 PM

## 2021-04-13 LAB — INTEGRATED 2
AFP MoM: 0.92
Alpha-Fetoprotein: 20.8 ng/mL
Crown Rump Length: 58 mm
DIA MoM: 1.15
DIA Value: 148.4 pg/mL
Estriol, Unconjugated: 0.91 ng/mL
Gest. Age on Collection Date: 12.1 weeks
Gestational Age: 15.6 weeks
Maternal Age at EDD: 29 yr
Nuchal Translucency (NT): 1.4 mm
Nuchal Translucency MoM: 1.08
Number of Fetuses: 1
PAPP-A MoM: 1.35
PAPP-A Value: 684.5 ng/mL
Test Results:: NEGATIVE
Weight: 226 [lb_av]
Weight: 226 [lb_av]
hCG MoM: 1.55
hCG Value: 50.4 IU/mL
uE3 MoM: 1.19

## 2021-04-14 ENCOUNTER — Encounter: Payer: Self-pay | Admitting: Women's Health

## 2021-04-14 ENCOUNTER — Telehealth: Payer: Self-pay | Admitting: *Deleted

## 2021-04-14 NOTE — Telephone Encounter (Signed)
Pt had vomiting episode last night and this morning when she coughed noticed blood tinge to her mucus. (She has been having the cough) Patient advised to monitor symptoms, the blood likely irritation from vomiting and coughing. Pt also sent list of meds safe to use in pregnancy for cough. Aware to call us back if anything changes. No other questions at this time.

## 2021-05-04 ENCOUNTER — Other Ambulatory Visit: Payer: Self-pay | Admitting: Obstetrics & Gynecology

## 2021-05-04 DIAGNOSIS — Z3492 Encounter for supervision of normal pregnancy, unspecified, second trimester: Secondary | ICD-10-CM

## 2021-05-04 DIAGNOSIS — Z363 Encounter for antenatal screening for malformations: Secondary | ICD-10-CM

## 2021-05-09 ENCOUNTER — Encounter: Payer: Self-pay | Admitting: Women's Health

## 2021-05-17 ENCOUNTER — Ambulatory Visit (INDEPENDENT_AMBULATORY_CARE_PROVIDER_SITE_OTHER): Payer: Commercial Managed Care - PPO | Admitting: Obstetrics & Gynecology

## 2021-05-17 ENCOUNTER — Other Ambulatory Visit: Payer: Self-pay

## 2021-05-17 ENCOUNTER — Ambulatory Visit (INDEPENDENT_AMBULATORY_CARE_PROVIDER_SITE_OTHER): Payer: Commercial Managed Care - PPO

## 2021-05-17 VITALS — BP 119/80 | HR 79 | Wt 234.0 lb

## 2021-05-17 DIAGNOSIS — Z363 Encounter for antenatal screening for malformations: Secondary | ICD-10-CM

## 2021-05-17 DIAGNOSIS — Z3480 Encounter for supervision of other normal pregnancy, unspecified trimester: Secondary | ICD-10-CM

## 2021-05-17 DIAGNOSIS — Z3A20 20 weeks gestation of pregnancy: Secondary | ICD-10-CM | POA: Diagnosis not present

## 2021-05-17 DIAGNOSIS — Z3492 Encounter for supervision of normal pregnancy, unspecified, second trimester: Secondary | ICD-10-CM

## 2021-05-17 NOTE — Progress Notes (Signed)
° °  LOW-RISK PREGNANCY VISIT Patient name: Sarah Estes MRN [id number]  Date of birth: 1990-09-22 Chief Complaint:   Routine Prenatal Visit ([place] today)  History of Present Illness:   Clayre Brandy is a 31 y.o. G46P1011 female at [redacted]w[redacted]d with an Estimated Date of Delivery: 09/29/21 being seen today for ongoing management of a low-risk pregnancy.  Depression screen [facility] 2/9 03/21/2021 09/24/2017 08/20/2017 09/26/2016 08/22/2016  Decreased Interest 0 0 0 0 3  Down, Depressed, Hopeless 0 0 0 0 2  PHQ - 2 Score 0 0 0 0 5  Altered sleeping 0 - - - 3  Tired, decreased energy 0 - - - 3  Change in appetite 0 - - - 3  Feeling bad or failure about yourself  0 - - - 3  Trouble concentrating 0 - - - 3  Moving slowly or fidgety/restless 0 - - - 1  Suicidal thoughts 0 - - - 0  PHQ-9 Score 0 - - - 21  Difficult doing work/chores - - - - Extremely dIfficult    [name].  . Vag. Bleeding: None.  Movement: Present. denies leaking of fluid. Review of Systems:   Pertinent items are noted in HPI Denies abnormal vaginal discharge w/ itching/odor/irritation, headaches, visual changes, shortness of breath, chest pain, abdominal pain, severe nausea/vomiting, or problems with urination or bowel movements unless otherwise stated above. Pertinent History Reviewed:  Reviewed past medical,surgical, social, obstetrical and family history.  Reviewed problem list, medications and allergies. Physical Assessment:   Vitals:   05/17/21 1541  BP: 119/80  Pulse: 79  Weight: 234 lb (106.1 kg)  Body mass index is 34.56 kg/m.        Physical Examination:   General appearance: Well appearing, and in no distress  Mental status: Alert, oriented to person, place, and time  Skin: Warm & dry  Cardiovascular: Normal heart rate noted  Respiratory: Normal respiratory effort, no distress  Abdomen: Soft, gravid, nontender  Pelvic: Cervical exam deferred         Extremities: Edema: None  Fetal Status:  Fetal Heart Rate (bpm): 154   Movement: Present    Chaperone: n/a    No results found for this or any previous visit (from the past 24 hour(s)).  Assessment & Plan:  1) Low-risk pregnancy G3P1011 at [redacted]w[redacted]d with an Estimated Date of Delivery: 09/29/21   2) Hx of gastric bypass,    Meds: No orders of the defined types were placed in this encounter.  Labs/procedures today:   Plan:  Continue routine obstetrical care  Next visit: prefers in person    Reviewed: Preterm labor symptoms and general obstetric precautions including but not limited to vaginal bleeding, contractions, leaking of fluid and fetal movement were reviewed in detail with the patient.  All questions were answered. Has home bp cuff. Rx faxed to . Check bp weekly, let us know if >140/90.   Follow-up: Return in about 4 weeks (around 06/14/2021) for [place].  No orders of the defined types were placed in this encounter.   Florian Buff, MD 05/17/2021 4:15 PM

## 2021-05-17 NOTE — Progress Notes (Signed)
[place] 20+5 breech,anterior placenta gr 0,svp of fluid 6 cm,normal ovaries,FHR 154 bpm,cx 4.2 cm,EFW 404 g 69%,anatomy complete,no obvious abnormalities

## 2021-05-31 ENCOUNTER — Inpatient Hospital Stay (HOSPITAL_BASED_OUTPATIENT_CLINIC_OR_DEPARTMENT_OTHER): Payer: Commercial Managed Care - PPO

## 2021-05-31 ENCOUNTER — Other Ambulatory Visit: Payer: Self-pay

## 2021-05-31 ENCOUNTER — Encounter (HOSPITAL_COMMUNITY): Payer: Self-pay | Admitting: Obstetrics and Gynecology

## 2021-05-31 ENCOUNTER — Inpatient Hospital Stay (HOSPITAL_COMMUNITY)
Admission: AD | Admit: 2021-05-31 | Discharge: 2021-05-31 | Disposition: A | Discharge status: Home / Self Care | Payer: Commercial Managed Care - PPO | Attending: Obstetrics and Gynecology | Admitting: Obstetrics and Gynecology

## 2021-05-31 DIAGNOSIS — W010XXA Fall on same level from slipping, tripping and stumbling without subsequent striking against object, initial encounter: Secondary | ICD-10-CM | POA: Insufficient documentation

## 2021-05-31 DIAGNOSIS — Z3A22 22 weeks gestation of pregnancy: Secondary | ICD-10-CM

## 2021-05-31 DIAGNOSIS — Z87891 Personal history of nicotine dependence: Secondary | ICD-10-CM | POA: Insufficient documentation

## 2021-05-31 DIAGNOSIS — W19XXXA Unspecified fall, initial encounter: Secondary | ICD-10-CM

## 2021-05-31 DIAGNOSIS — Y9301 Activity, walking, marching and hiking: Secondary | ICD-10-CM | POA: Diagnosis not present

## 2021-05-31 DIAGNOSIS — O9A212 Injury, poisoning and certain other consequences of external causes complicating pregnancy, second trimester: Secondary | ICD-10-CM | POA: Diagnosis not present

## 2021-05-31 DIAGNOSIS — O26892 Other specified pregnancy related conditions, second trimester: Secondary | ICD-10-CM | POA: Insufficient documentation

## 2021-05-31 LAB — URINALYSIS, ROUTINE W REFLEX MICROSCOPIC
Bilirubin Urine: NEGATIVE
Glucose, UA: NEGATIVE mg/dL
Hgb urine dipstick: NEGATIVE
Ketones, ur: NEGATIVE mg/dL
Nitrite: NEGATIVE
Protein, ur: NEGATIVE mg/dL
Specific Gravity, Urine: 1.003 — ABNORMAL LOW (ref 1.005–1.030)
pH: 8 (ref 5.0–8.0)

## 2021-05-31 NOTE — MAU Note (Addendum)
I was walking to my car to go to work this am at 0340. I tripped over a trash bag and fell on my stomach and L elbow. I had items in my hands so could not catch myself. Denies VB or LOF. Has not felt the baby move since then. Having mild cramping and some mild lower back pain.
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
## 2021-06-14 ENCOUNTER — Ambulatory Visit (INDEPENDENT_AMBULATORY_CARE_PROVIDER_SITE_OTHER): Payer: Commercial Managed Care - PPO | Admitting: Obstetrics & Gynecology

## 2021-06-14 ENCOUNTER — Other Ambulatory Visit: Payer: Self-pay

## 2021-06-14 VITALS — BP 126/85 | HR 80 | Wt 239.0 lb

## 2021-06-14 DIAGNOSIS — Z3480 Encounter for supervision of other normal pregnancy, unspecified trimester: Secondary | ICD-10-CM

## 2021-06-14 NOTE — Progress Notes (Signed)
? ?  LOW-RISK PREGNANCY VISIT ?Patient name: Sarah Estes MRN [id number]  Date of birth: 1991/01/29 ?Chief Complaint:   ?Routine Prenatal Visit ? ?History of Present Illness:   ?Sarah Estes is a 31 y.o. G58P1011 female at [redacted]w[redacted]d with an Estimated Date of Delivery: 09/29/21 being seen today for ongoing management of a low-risk pregnancy.  ?Depression screen [facility] 2/9 03/21/2021 09/24/2017 08/20/2017 09/26/2016 08/22/2016  ?Decreased Interest 0 0 0 0 3  ?Down, Depressed, Hopeless 0 0 0 0 2  ?PHQ - 2 Score 0 0 0 0 5  ?Altered sleeping 0 - - - 3  ?Tired, decreased energy 0 - - - 3  ?Change in appetite 0 - - - 3  ?Feeling bad or failure about yourself  0 - - - 3  ?Trouble concentrating 0 - - - 3  ?Moving slowly or fidgety/restless 0 - - - 1  ?Suicidal thoughts 0 - - - 0  ?PHQ-9 Score 0 - - - 21  ?Difficult doing work/chores - - - - Extremely dIfficult  ? ? ?[name].  . Vag. Bleeding: None.  Movement: Present. denies leaking of fluid. ?Review of Systems:   ?Pertinent items are noted in HPI ?Denies abnormal vaginal discharge w/ itching/odor/irritation, headaches, visual changes, shortness of breath, chest pain, abdominal pain, severe nausea/vomiting, or problems with urination or bowel movements unless otherwise stated above. ?Pertinent History Reviewed:  ?Reviewed past medical,surgical, social, obstetrical and family history.  ?Reviewed problem list, medications and allergies. ?Physical Assessment:  ? ?Vitals:  ? 06/14/21 1423  ?BP: 126/85  ?Pulse: 80  ?Weight: 239 lb (108.4 kg)  ?Body mass index is 35.29 kg/m?. ?  ?     Physical Examination:  ? General appearance: Well appearing, and in no distress ? Mental status: Alert, oriented to person, place, and time ? Skin: Warm & dry ? Cardiovascular: Normal heart rate noted ? Respiratory: Normal respiratory effort, no distress ? Abdomen: Soft, gravid, nontender ? Pelvic: Cervical exam deferred        ? Extremities: Edema: None ? ?Fetal Status: Fetal Heart  Rate (bpm): 178 Fundal Height: 24 cm Movement: Present   ? ?Chaperone: n/a   ? ?No results found for this or any previous visit (from the past 24 hour(s)).  ?Assessment & Plan:  ?1) Low-risk pregnancy G3P1011 at [redacted]w[redacted]d with an Estimated Date of Delivery: 09/29/21  ? ?2) Hx of gastric bypass,  ?  ?Meds: No orders of the defined types were placed in this encounter. ? ?Labs/procedures today:  ? ?Plan:  Continue routine obstetrical care  ?Next visit: prefers in person   ? ?Reviewed: Preterm labor symptoms and general obstetric precautions including but not limited to vaginal bleeding, contractions, leaking of fluid and fetal movement were reviewed in detail with the patient.  All questions were answered. Has home bp cuff. Rx faxed to . Check bp weekly, let us know if >140/90.  ? ?Follow-up: Return in about 3 weeks (around 07/05/2021) for PN2, HROB, LROB. ? ?No orders of the defined types were placed in this encounter. ? ? ?Lazaro Arms, MD ?06/14/2021 ?3:11 PM ? ?

## 2021-06-17 DIAGNOSIS — Z029 Encounter for administrative examinations, unspecified: Secondary | ICD-10-CM

## 2021-06-20 ENCOUNTER — Ambulatory Visit (INDEPENDENT_AMBULATORY_CARE_PROVIDER_SITE_OTHER): Payer: Commercial Managed Care - PPO | Admitting: Advanced Practice Midwife

## 2021-06-20 ENCOUNTER — Other Ambulatory Visit: Payer: Self-pay

## 2021-06-20 VITALS — BP 131/86 | HR 97 | Wt 239.0 lb

## 2021-06-20 DIAGNOSIS — R000 Tachycardia, unspecified: Secondary | ICD-10-CM

## 2021-06-20 DIAGNOSIS — Z3480 Encounter for supervision of other normal pregnancy, unspecified trimester: Secondary | ICD-10-CM

## 2021-06-20 NOTE — Patient Instructions (Addendum)
? ? ? ?  [place] ?[facility] ?[phone] ? ? ?Vagal Maneuver Techniques ? ?There are different types of vagal maneuvers that can be used to slow a person?s heart rate. There is not one maneuver which will work for all patients. In some instances, it may take a little trial and error to determine what technique works best for an individual patient. All of the procedures or techniques below may stimulate the vagal nerve. ? ?Bearing Down: Bearing down, which medically is referred to as the Valsalva maneuver, is one of the most common ways to stimulate the vagus nerve. The patient is instructed to bear down as if they were having a bowel movement. In effect, the patient is expiring against a closed glottis. An alternative way to perform a Valsalva maneuver is to tell the patient to blow through an occluded straw for several seconds (Hold one end closed and blow through the other). These maneuvers increase intrathoracic pressure and stimulate the vagus nerve. ? ?Coughing: Coughing creates the same physiological response as bearing down, but some people may find it easier to perform. The cough must be forceful and sustained i.e. a single cough will likely not be effective in terminating an arrhythmia ? ?Cold Stimulus to the Face: This technique involves emerging the face in ice cold water. Alternative methods include placing on icepack on the face or a washcloth soaked in ice water. The cold stimuli to the face should last about 10 seconds. This type of vagal maneuver creates a physiological response similar to that which occurs if a person is submerged in cold water (diver?s reflex). ? ?Gagging: Although it may not sound pleasant, gagging also stimulates the vagus nerve and can stop an episode of SVT.  A tongue depressor is briefly inserted into the mouth, touching the back of the throat, which causes the person to reflexively gag. The gag reflex stimulates the vagus  nerve. ? ?

## 2021-06-20 NOTE — Progress Notes (Signed)
? ?  LOW-RISK PREGNANCY VISIT ?Patient name: Sarah Estes MRN [id number]  Date of birth: 1990-09-15 ?Chief Complaint:   ?Routine Prenatal Visit and Follow-up ? ?History of Present Illness:   ?Sarah Estes is a 31 y.o. G73P1011 female at [redacted]w[redacted]d with an Estimated Date of Delivery: 09/29/21 being seen today for ongoing management of a low-risk pregnancy.  ?Today she reports heart racing at least once every other day, sometimes twice a day.  Episode lasts around 3 minutes, gets dizzy w/them now. Wants to start FMLA now--plans to go back to work if she can get treated.  ?. Contractions: Not present. Vag. Bleeding: None.  Movement: Present. denies leaking of fluid. ?Review of Systems:   ?Pertinent items are noted in HPI ?Denies abnormal vaginal discharge w/ itching/odor/irritation, headaches, visual changes, shortness of breath, chest pain, abdominal pain, severe nausea/vomiting, or problems with urination or bowel movements unless otherwise stated above. ?Pertinent History Reviewed:  ?Reviewed past medical,surgical, social, obstetrical and family history.  ?Reviewed problem list, medications and allergies. ?Physical Assessment:  ? ?Vitals:  ? 06/20/21 1350  ?BP: 131/86  ?Pulse: 97  ?Weight: 239 lb (108.4 kg)  ?Body mass index is 35.29 kg/m?. ?  ?     Physical Examination:  ? General appearance: Well appearing, and in no distress ? Mental status: Alert, oriented to person, place, and time ? Skin: Warm & dry ? Cardiovascular: Normal heart rate noted ? Respiratory: Normal respiratory effort, no distress ? Abdomen: Soft, gravid, nontender ? Pelvic: Cervical exam deferred        ? Extremities: Edema: None ? ?Fetal Status:     Movement: Present   ? ?Chaperone: n/a   ? ?No results found for this or any previous visit (from the past 24 hour(s)).  ?Assessment & Plan:  ?1) Low-risk pregnancy G3P1011 at [redacted]w[redacted]d with an Estimated Date of Delivery: 09/29/21  ? ?2) Intermittent tachycardia, symptomatic, cards referral made and note  to start FMLA given (papers to be filled out by Tish this week).  Understands that this will fall under maternity leave (not disability) unless cardiology feels that she should not work.   ?  ?Meds: No orders of the defined types were placed in this encounter. ? ?Labs/procedures today:  ? ?Plan:  Continue routine obstetrical care  ?Next visit: prefers will be in person for PN2    ? ?Reviewed: Preterm labor symptoms and general obstetric precautions including but not limited to vaginal bleeding, contractions, leaking of fluid and fetal movement were reviewed in detail with the patient.  All questions were answered. Has home bp cuff.. Check bp weekly, let us know if >140/90.  ? ?Follow-up: Return for As scheduled. ? ?Orders Placed This Encounter  ?Procedures  ? Ambulatory referral to Cardiology  ? ?Jacklyn Shell DNP, CNM ?06/20/2021 ?2:36 PM ? ?

## 2021-07-05 ENCOUNTER — Encounter: Payer: Self-pay | Admitting: Obstetrics & Gynecology

## 2021-07-05 ENCOUNTER — Other Ambulatory Visit: Payer: Commercial Managed Care - PPO

## 2021-07-05 ENCOUNTER — Ambulatory Visit (INDEPENDENT_AMBULATORY_CARE_PROVIDER_SITE_OTHER): Payer: Commercial Managed Care - PPO | Admitting: Obstetrics & Gynecology

## 2021-07-05 VITALS — BP 129/80 | HR 104 | Wt 242.0 lb

## 2021-07-05 DIAGNOSIS — Z3480 Encounter for supervision of other normal pregnancy, unspecified trimester: Secondary | ICD-10-CM

## 2021-07-05 DIAGNOSIS — Z3A27 27 weeks gestation of pregnancy: Secondary | ICD-10-CM

## 2021-07-05 DIAGNOSIS — Z131 Encounter for screening for diabetes mellitus: Secondary | ICD-10-CM

## 2021-07-05 DIAGNOSIS — Z9884 Bariatric surgery status: Secondary | ICD-10-CM

## 2021-07-05 DIAGNOSIS — Z3481 Encounter for supervision of other normal pregnancy, first trimester: Secondary | ICD-10-CM

## 2021-07-05 NOTE — Progress Notes (Signed)
? ?  LOW-RISK PREGNANCY VISIT ?Patient name: Sarah Estes MRN [id number]  Date of birth: 1990-07-06 ?Chief Complaint:   ?Routine Prenatal Visit (PN2) ? ?History of Present Illness:   ?Sarah Estes is a 31 y.o. G61P1011 female at [redacted]w[redacted]d with an Estimated Date of Delivery: 09/29/21 being seen today for ongoing management of a low-risk pregnancy.  ? ?  07/05/2021  ?  8:42 AM 03/21/2021  ?  2:42 PM 09/24/2017  ? 10:44 AM 08/20/2017  ? 11:45 AM 09/26/2016  ?  1:43 PM  ?Depression screen PHQ 2/9  ?Decreased Interest 0 0 0 0 0  ?Down, Depressed, Hopeless 0 0 0 0 0  ?PHQ - 2 Score 0 0 0 0 0  ?Altered sleeping 1 0     ?Tired, decreased energy 1 0     ?Change in appetite 0 0     ?Feeling bad or failure about yourself  0 0     ?Trouble concentrating 0 0     ?Moving slowly or fidgety/restless 0 0     ?Suicidal thoughts 0 0     ?PHQ-9 Score 2 0     ? ? ?[name]. Contractions: Not present. Vag. Bleeding: None.  Movement: Present. denies leaking of fluid. ?Review of Systems:   ?Pertinent items are noted in HPI ?Denies abnormal vaginal discharge w/ itching/odor/irritation, headaches, visual changes, shortness of breath, chest pain, abdominal pain, severe nausea/vomiting, or problems with urination or bowel movements unless otherwise stated above. ?Pertinent History Reviewed:  ?Reviewed past medical,surgical, social, obstetrical and family history.  ?Reviewed problem list, medications and allergies. ?Physical Assessment:  ? ?Vitals:  ? 07/05/21 0843  ?BP: 129/80  ?Pulse: (!) 104  ?Weight: 242 lb (109.8 kg)  ?Body mass index is 35.74 kg/m?. ?  ?     Physical Examination:  ? General appearance: Well appearing, and in no distress ? Mental status: Alert, oriented to person, place, and time ? Skin: Warm & dry ? Cardiovascular: Normal heart rate noted ? Respiratory: Normal respiratory effort, no distress ? Abdomen: Soft, gravid, nontender ? Pelvic: Cervical exam deferred        ? Extremities: Edema: None ? ?Fetal  Status: Fetal Heart Rate (bpm): 156 Fundal Height: 28 cm Movement: Present   ? ?Chaperone: n/a   ? ?No results found for this or any previous visit (from the past 24 hour(s)).  ?Assessment & Plan:  ?1) Low-risk pregnancy G3P1011 at [redacted]w[redacted]d with an Estimated Date of Delivery: 09/29/21  ? ?2) Hx of gastric bypass,  ?  ?Meds: No orders of the defined types were placed in this encounter. ? ?Labs/procedures today: PN2 ? ?Plan:  Continue routine obstetrical care  ?Next visit: prefers in person   ? ?Reviewed: Preterm labor symptoms and general obstetric precautions including but not limited to vaginal bleeding, contractions, leaking of fluid and fetal movement were reviewed in detail with the patient.  All questions were answered. Has home bp cuff. Rx faxed to . Check bp weekly, let us know if >140/90.  ? ?Follow-up: Return in about 4 weeks (around 08/02/2021) for LROB. ? ?No orders of the defined types were placed in this encounter. ? ? ?Lazaro Arms, MD ?07/05/2021 ?9:42 AM ? ?

## 2021-07-06 ENCOUNTER — Encounter: Payer: Self-pay | Admitting: Women's Health

## 2021-07-06 ENCOUNTER — Other Ambulatory Visit: Payer: Self-pay | Admitting: Women's Health

## 2021-07-06 ENCOUNTER — Other Ambulatory Visit: Payer: Self-pay | Admitting: *Deleted

## 2021-07-06 DIAGNOSIS — O24419 Gestational diabetes mellitus in pregnancy, unspecified control: Secondary | ICD-10-CM

## 2021-07-06 DIAGNOSIS — O0990 Supervision of high risk pregnancy, unspecified, unspecified trimester: Secondary | ICD-10-CM

## 2021-07-06 LAB — RPR: RPR Ser Ql: NONREACTIVE

## 2021-07-06 LAB — HIV ANTIBODY (ROUTINE TESTING W REFLEX): HIV Screen 4th Generation wRfx: NONREACTIVE

## 2021-07-06 LAB — CBC
Hematocrit: 31.2 % — ABNORMAL LOW (ref 34.0–46.6)
Hemoglobin: 9.6 g/dL — ABNORMAL LOW (ref 11.1–15.9)
MCH: 25.8 pg — ABNORMAL LOW (ref 26.6–33.0)
MCHC: 30.8 g/dL — ABNORMAL LOW (ref 31.5–35.7)
MCV: 84 fL (ref 79–97)
Platelets: 360 10*3/uL (ref 150–450)
RBC: 3.72 x10E6/uL — ABNORMAL LOW (ref 3.77–5.28)
RDW: 14.5 % (ref 11.7–15.4)
WBC: 10.5 10*3/uL (ref 3.4–10.8)

## 2021-07-06 LAB — GLUCOSE TOLERANCE, 2 HOURS W/ 1HR
Glucose, 1 hour: 198 mg/dL — ABNORMAL HIGH (ref 70–179)
Glucose, 2 hour: 78 mg/dL (ref 70–152)
Glucose, Fasting: 79 mg/dL (ref 70–91)

## 2021-07-06 LAB — ANTIBODY SCREEN: Antibody Screen: NEGATIVE

## 2021-07-06 MED ORDER — CONTOUR NEXT MONITOR W/DEVICE KIT: 1.0000 | PACK | 0 refills | Status: DC

## 2021-07-06 MED ORDER — CONTOUR NEXT TEST VI STRP: ORAL_STRIP | 12 refills | Status: DC

## 2021-07-06 MED ORDER — FERROUS SULFATE 325 (65 FE) MG PO TABS: 325.0000 mg | ORAL_TABLET | ORAL | 2 refills | Status: DC

## 2021-07-06 MED ORDER — MICROLET LANCETS MISC: 12 refills | Status: DC
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
## 2021-07-18 ENCOUNTER — Ambulatory Visit: Payer: Commercial Managed Care - PPO | Admitting: Cardiovascular Disease

## 2021-07-18 DIAGNOSIS — D509 Iron deficiency anemia, unspecified: Secondary | ICD-10-CM

## 2021-07-18 DIAGNOSIS — R000 Tachycardia, unspecified: Secondary | ICD-10-CM

## 2021-07-20 ENCOUNTER — Encounter: Payer: Commercial Managed Care - PPO | Attending: Women's Health | Admitting: Registered"

## 2021-07-20 DIAGNOSIS — O24419 Gestational diabetes mellitus in pregnancy, unspecified control: Secondary | ICD-10-CM | POA: Diagnosis present

## 2021-07-20 DIAGNOSIS — Z713 Dietary counseling and surveillance: Secondary | ICD-10-CM | POA: Diagnosis not present

## 2021-07-20 DIAGNOSIS — Z3A00 Weeks of gestation of pregnancy not specified: Secondary | ICD-10-CM | POA: Diagnosis not present

## 2021-07-21 ENCOUNTER — Encounter: Payer: Self-pay | Admitting: Registered"

## 2021-07-21 NOTE — Progress Notes (Signed)
Patient was seen on 07/21/21 for Gestational Diabetes self-management class at [facility].  ? ?Medical history includes RYN gastric bypass in 2013 ? ?The following learning objectives were met by the patient during this course: ? ?States the definition of Gestational Diabetes ?States why dietary management is important in controlling blood glucose ?Describes the effects each nutrient has on blood glucose levels ?Demonstrates ability to create a balanced meal plan ?Demonstrates carbohydrate counting  ?States when to check blood glucose levels ?Demonstrates proper blood glucose monitoring techniques ?States the effect of stress and exercise on blood glucose levels ?States the importance of limiting caffeine and abstaining from alcohol and smoking ? ?Blood glucose monitor given: Patient has meter and is checking blood sugar prior to class  ? ?Patient instructed to monitor glucose levels: ?FBS: 60 - <95; 1 hour: <140; 2 hour: <120 ? ?Patient received handouts: ?Nutrition Diabetes and Pregnancy, including carb counting list ? ?Patient will be seen for follow-up as needed. ?

## 2021-08-02 ENCOUNTER — Encounter: Payer: Self-pay | Admitting: Women's Health

## 2021-08-02 ENCOUNTER — Ambulatory Visit (INDEPENDENT_AMBULATORY_CARE_PROVIDER_SITE_OTHER): Payer: Commercial Managed Care - PPO | Admitting: Women's Health

## 2021-08-02 VITALS — BP 112/77 | HR 74 | Wt 238.0 lb

## 2021-08-02 DIAGNOSIS — Z3A31 31 weeks gestation of pregnancy: Secondary | ICD-10-CM

## 2021-08-02 DIAGNOSIS — O0990 Supervision of high risk pregnancy, unspecified, unspecified trimester: Secondary | ICD-10-CM

## 2021-08-02 DIAGNOSIS — O09893 Supervision of other high risk pregnancies, third trimester: Secondary | ICD-10-CM

## 2021-08-02 DIAGNOSIS — O24410 Gestational diabetes mellitus in pregnancy, diet controlled: Secondary | ICD-10-CM

## 2021-08-02 LAB — POCT URINALYSIS DIPSTICK OB
Blood, UA: NEGATIVE
Glucose, UA: NEGATIVE
Ketones, UA: NEGATIVE
Leukocytes, UA: NEGATIVE
Nitrite, UA: NEGATIVE
POC,PROTEIN,UA: NEGATIVE

## 2021-08-02 NOTE — Patient Instructions (Signed)
Sarah Estes, thank you for choosing our office today! We appreciate the opportunity to meet your healthcare needs. You may receive a short survey by mail, e-mail, or through [employer]. If you are happy with your care we would appreciate if you could take just a few minutes to complete the survey questions. We read all of your comments and take your feedback very seriously. Thank you again for choosing our office.  ?Center for [employer] Team at [facility] ? ?Women's & [place] at [facility] ?([address] [place], [place] [address]) ?Entrance C, located off of [address] ?Free 24/7 valet parking  ? ?CLASSES: Go to [email or web address] to register for classes (childbirth, breastfeeding, waterbirth, infant CPR, daddy bootcamp, etc.) ? ?Call the office [phone]) or go to [facility] if: ?You begin to have strong, frequent contractions ?Your water breaks.  Sometimes it is a big gush of fluid, sometimes it is just a trickle that keeps getting your panties wet or running down your legs ?You have vaginal bleeding.  It is normal to have a small amount of spotting if your cervix was checked.  ?You don't feel your baby moving like normal.  If you don't, get you something to eat and drink and lay down and focus on feeling your baby move.   If your baby is still not moving like normal, you should call the office or go to [facility] ? ?Call the office [phone]) or go to [employer] [facility] hospital for these signs of pre-eclampsia: ?Severe headache that does not go away with Tylenol ?Visual changes- seeing spots, double, blurred vision ?Pain under your right breast or upper abdomen that does not go away with Tums or heartburn medicine ?Nausea and/or vomiting ?Severe swelling in your hands, feet, and face  ? ?Tdap Vaccine ?It is recommended that you get the Tdap vaccine during the third trimester of EACH pregnancy to help protect your baby from getting pertussis (whooping cough) ?27-36 weeks is the BEST time to do  this so that you can pass the protection on to your baby. During pregnancy is better than after pregnancy, but if you are unable to get it during pregnancy it will be offered at the hospital.  ?You can get this vaccine with [place], at the health department, your family doctor, or some local pharmacies ?Everyone who will be around your baby should also be up-to-date on their vaccines before the baby comes. Adults (who are not pregnant) only need 1 dose of Tdap during adulthood.  ? ?[place] Pediatricians/Family Doctors ?[facility]): [address] [place], [phone]           ?[facility]: [address], [phone]                ?[facility]): [place], [phone] (call to ask if accepting patients) ?[facility]: [address], [place], [place]   ? ?[place] Pediatricians/Family Doctors ?[facility]): [address], [address], [phone] ?[place]: [address] [place], [phone] ?[facility]: [place], [phone] ? ?[place]  ?[place] [facility]): [phone] ?[facility]: [place], [phone]  ? ?[place] ?[place]: [place] [address] [phone] ? ?[place]  ?[facility]: [phone], [phone] ? ?Home Blood Pressure Monitoring for Patients  ? ?Your provider has recommended that you check your  blood pressure (BP) at least once a week at home. If you do not have a blood pressure cuff at home, one will be provided for you. Contact your provider if you have not received your monitor within 1 week.  ? ?Helpful Tips for Accurate Home Blood Pressure Checks  ?Don't smoke, exercise, or drink caffeine 30 minutes before checking your BP ?Use the restroom before checking your BP (a full bladder can raise your  pressure) ?Relax in a comfortable upright chair ?Feet on the ground ?Left arm resting comfortably on a flat surface at the level of your heart ?Legs uncrossed ?Back supported ?Sit quietly and don't talk ?Place the cuff on your bare arm ?Adjust snuggly, so that only two fingertips can fit between your skin and the top of the cuff ?Check 2 readings separated by at least one minute ?Keep a log of your BP readings ?For a visual, please reference this diagram: http://ccnc.care/bpdiagram ? ?Provider Name: [facility]     Phone: [phone] ? ?Zone 1: ALL CLEAR  ?Continue to monitor your symptoms:  ?BP reading is less than 140 (top number) or less than 90 (bottom number)  ?No right upper stomach pain ?No headaches or seeing spots ?No feeling nauseated or throwing up ?No swelling in face and hands ? ?Zone 2: CAUTION ?Call your doctor's office for any of the following:  ?BP reading is greater than 140 (top number) or greater than 90 (bottom number)  ?Stomach pain under your ribs in the middle or right side ?Headaches or seeing spots ?Feeling nauseated or throwing up ?Swelling in face and hands ? ?Zone 3: EMERGENCY  ?Seek immediate medical care if you have any of the following:  ?BP reading is greater than160 (top number) or greater than 110 (bottom number) ?Severe headaches not improving with Tylenol ?Serious difficulty catching your breath ?Any worsening symptoms from Zone 2  ?Preterm Labor and Birth Information ? ?The normal length of a pregnancy is 39-41 weeks. Preterm labor is when labor starts before 37 completed weeks of pregnancy. ?What are the risk factors for preterm labor? ?Preterm labor is more likely to occur in women who: ?Have certain infections during pregnancy such as a bladder infection, sexually transmitted infection, or infection inside the uterus (chorioamnionitis). ?Have a shorter-than-normal cervix. ?Have gone into preterm labor before. ?Have had surgery on their cervix. ?Are younger than age 72  or older than age 108. ?Are African American. ?Are pregnant with twins or multiple babies (multiple gestation). ?Take street drugs or smoke while pregnant. ?Do not gain enough weight while pregnant. ?Became pregnant shortly after having been pregnant. ?What are the symptoms of preterm labor? ?Symptoms of preterm labor include: ?Cramps similar to those that can happen during a menstrual period. The cramps may happen with diarrhea. ?Pain in the abdomen or lower back. ?Regular uterine contractions that may feel like tightening of the abdomen. ?A feeling of increased pressure in the pelvis. ?Increased watery or bloody mucus discharge from the vagina. ?Water breaking (ruptured amniotic sac). ?Why is it important to recognize signs of preterm labor? ?It is important to recognize signs of preterm labor because babies who are born prematurely may not be fully developed. This can put them at an increased risk for: ?Long-term (chronic) heart and lung problems. ?Difficulty immediately after birth with regulating body systems, including blood sugar, body temperature, heart rate, and breathing rate. ?Bleeding in the brain. ?Cerebral palsy. ?Learning difficulties. ?Death. ?These risks are highest for babies who are born before 11 weeks  of pregnancy. ?How is preterm labor treated? ?Treatment depends on the length of your pregnancy, your condition, and the health of your baby. It may involve: ?Having a stitch (suture) placed in your cervix to prevent your cervix from opening too early (cerclage). ?Taking or being given medicines, such as: ?Hormone medicines. These may be given early in pregnancy to help support the pregnancy. ?Medicine to stop contractions. ?Medicines to help mature the baby?s lungs. These may be prescribed if the risk of delivery is high. ?Medicines to prevent your baby from developing cerebral palsy. ?If the labor happens before 34 weeks of pregnancy, you may need to stay in the hospital. ?What should I do if I  think I am in preterm labor? ?If you think that you are going into preterm labor, call your health care provider right away. ?How can I prevent preterm labor in future pregnancies? ?To increase your chan

## 2021-08-02 NOTE — Progress Notes (Signed)
? ?HIGH-RISK PREGNANCY VISIT ?Patient name: Sarah Estes MRN [id number]  Date of birth: October 21, 1990 ?Chief Complaint:   ?Routine Prenatal Visit ? ?History of Present Illness:   ?Sarah Estes is a 31 y.o. G2P1011 female at [redacted]w[redacted]d with an Estimated Date of Delivery: 09/29/21 being seen today for ongoing management of a high-risk pregnancy complicated by diabetes mellitus A1DM.   ? ?Today she reports  has been very good w/ diet, only 3 FBS >95 (highest 99), 2hr pp 81-131 (majority <120, drank regular soda w/ one meal and 2hr was 238, has not done that since) . Contractions: Not present. Vag. Bleeding: None.  Movement: Present. denies leaking of fluid.  ? ? ?  07/21/2021  ?  1:11 PM 07/05/2021  ?  8:42 AM 03/21/2021  ?  2:42 PM 09/24/2017  ? 10:44 AM 08/20/2017  ? 11:45 AM  ?Depression screen PHQ 2/9  ?Decreased Interest 0 0 0 0 0  ?Down, Depressed, Hopeless  0 0 0 0  ?PHQ - 2 Score 0 0 0 0 0  ?Altered sleeping  1 0    ?Tired, decreased energy  1 0    ?Change in appetite  0 0    ?Feeling bad or failure about yourself   0 0    ?Trouble concentrating  0 0    ?Moving slowly or fidgety/restless  0 0    ?Suicidal thoughts  0 0    ?PHQ-9 Score  2 0    ? ?  ? ?  07/05/2021  ?  8:42 AM 03/21/2021  ?  2:42 PM  ?GAD 7 : Generalized Anxiety Score  ?Nervous, Anxious, on Edge 0 1  ?Control/stop worrying 0 1  ?Worry too much - different things 0 1  ?Trouble relaxing 2 2  ?Restless 0 1  ?Easily annoyed or irritable 0 3  ?Afraid - awful might happen 1 0  ?Total GAD 7 Score 3 9  ? ? ? ?Review of Systems:   ?Pertinent items are noted in HPI ?Denies abnormal vaginal discharge w/ itching/odor/irritation, headaches, visual changes, shortness of breath, chest pain, abdominal pain, severe nausea/vomiting, or problems with urination or bowel movements unless otherwise stated above. ?Pertinent History Reviewed:  ?Reviewed past medical,surgical, social, obstetrical and family history.  ?Reviewed problem list, medications and allergies. ?Physical  Assessment:  ? ?Vitals:  ? 08/02/21 1139  ?BP: 112/77  ?Pulse: 74  ?Weight: 238 lb (108 kg)  ?Body mass index is 35.15 kg/m?. ?     ?     Physical Examination:  ? General appearance: alert, well appearing, and in no distress ? Mental status: alert, oriented to person, place, and time ? Skin: warm & dry  ? Extremities: Edema: None  ?  Cardiovascular: normal heart rate noted ? Respiratory: normal respiratory effort, no distress ? Abdomen: gravid, soft, non-tender ? Pelvic: Cervical exam deferred        ? ?Fetal Status: Fetal Heart Rate (bpm): 142 Fundal Height: 31 cm Movement: Present   ? ?Fetal Surveillance Testing today: doppler  ? ?Chaperone: N/A   ? ?Results for orders placed or performed in visit on 08/02/21 (from the past 24 hour(s))  ?POC Urinalysis Dipstick OB  ? Collection Time: 08/02/21 11:42 AM  ?Result Value Ref Range  ? Color, UA    ? Clarity, UA    ? Glucose, UA Negative Negative  ? Bilirubin, UA    ? Ketones, UA neg   ? Spec Grav, UA    ? Blood, UA neg   ?  pH, UA    ? POC,PROTEIN,UA Negative Negative, Trace, Small (1+), Moderate (2+), Large (3+), 4+  ? Urobilinogen, UA    ? Nitrite, UA neg   ? Leukocytes, UA Negative Negative  ? Appearance    ? Odor    ?  ?Assessment & Plan:  ?High-risk pregnancy: G3P1011 at [redacted]w[redacted]d with an Estimated Date of Delivery: 09/29/21  ? ?1) A1DM, stable, continue tweaking diet, try walking after supper, if notices % of abnormals increasing before next visit, let us know ? ?2) H/O gastric bypass ? ?Meds: No orders of the defined types were placed in this encounter. ? ? ?Labs/procedures today: none ? ?Treatment Plan:  Growth u/s q4wks    No antenatal testing as long as remains off meds     Deliver @ 39-40wks:____  ? ?Reviewed: Preterm labor symptoms and general obstetric precautions including but not limited to vaginal bleeding, contractions, leaking of fluid and fetal movement were reviewed in detail with the patient.  All questions were answered. Does have home bp cuff. Office bp  cuff given: not applicable. Check bp weekly, let us know if consistently >140 and/or >90. ? ?Follow-up: Return in about 2 weeks (around 08/16/2021) for HROB, MD or CNM, in person; then 4wks from now for HROB w/ EFW u/s. ? ? ?Future Appointments  ?Date Time Provider [place]  ?08/16/2021  9:30 AM Roma Schanz, CNM [facility]  ?08/31/2021 10:45 AM [facility] [place] [facility] None  ?08/31/2021 11:50 AM Myrtis Ser, CNM [facility]  ? ? ?Orders Placed This Encounter  ?Procedures  ? POC Urinalysis Dipstick OB  ? ?[place], [place] ?08/02/2021 ?12:55 PM  ?

## 2021-08-08 ENCOUNTER — Encounter: Payer: Self-pay | Admitting: Women's Health

## 2021-08-08 ENCOUNTER — Other Ambulatory Visit: Payer: Self-pay | Admitting: Women's Health

## 2021-08-08 DIAGNOSIS — O24415 Gestational diabetes mellitus in pregnancy, controlled by oral hypoglycemic drugs: Secondary | ICD-10-CM

## 2021-08-08 MED ORDER — METFORMIN HCL 500 MG PO TABS: 500.0000 mg | ORAL_TABLET | Freq: Two times a day (BID) | ORAL | 3 refills | Status: DC

## 2021-08-10 ENCOUNTER — Ambulatory Visit (INDEPENDENT_AMBULATORY_CARE_PROVIDER_SITE_OTHER): Payer: Commercial Managed Care - PPO

## 2021-08-10 ENCOUNTER — Other Ambulatory Visit: Payer: Self-pay | Admitting: Women's Health

## 2021-08-10 DIAGNOSIS — O24410 Gestational diabetes mellitus in pregnancy, diet controlled: Secondary | ICD-10-CM

## 2021-08-10 DIAGNOSIS — Z3A32 32 weeks gestation of pregnancy: Secondary | ICD-10-CM | POA: Diagnosis not present

## 2021-08-10 NOTE — Progress Notes (Signed)
[place] 32+6 wks,cephalic,anterior placenta gr 0,AFI 18.9 cm,normal ovaries,FHR 132 bpm,BPP 8/8,EFW 2441 g 86% ?

## 2021-08-16 ENCOUNTER — Ambulatory Visit (INDEPENDENT_AMBULATORY_CARE_PROVIDER_SITE_OTHER): Payer: Commercial Managed Care - PPO | Admitting: Women's Health

## 2021-08-16 ENCOUNTER — Other Ambulatory Visit: Payer: Commercial Managed Care - PPO

## 2021-08-16 ENCOUNTER — Encounter: Payer: Self-pay | Admitting: Women's Health

## 2021-08-16 VITALS — BP 115/74 | HR 90 | Wt 241.4 lb

## 2021-08-16 DIAGNOSIS — Z1389 Encounter for screening for other disorder: Secondary | ICD-10-CM

## 2021-08-16 DIAGNOSIS — O0990 Supervision of high risk pregnancy, unspecified, unspecified trimester: Secondary | ICD-10-CM

## 2021-08-16 DIAGNOSIS — Z23 Encounter for immunization: Secondary | ICD-10-CM | POA: Diagnosis not present

## 2021-08-16 DIAGNOSIS — Z331 Pregnant state, incidental: Secondary | ICD-10-CM

## 2021-08-16 DIAGNOSIS — O0993 Supervision of high risk pregnancy, unspecified, third trimester: Secondary | ICD-10-CM | POA: Diagnosis not present

## 2021-08-16 DIAGNOSIS — Z3A33 33 weeks gestation of pregnancy: Secondary | ICD-10-CM

## 2021-08-16 DIAGNOSIS — O24419 Gestational diabetes mellitus in pregnancy, unspecified control: Secondary | ICD-10-CM | POA: Diagnosis not present

## 2021-08-16 LAB — POCT URINALYSIS DIPSTICK OB
Blood, UA: NEGATIVE
Glucose, UA: NEGATIVE
Ketones, UA: NEGATIVE
Leukocytes, UA: NEGATIVE
Nitrite, UA: NEGATIVE
POC,PROTEIN,UA: NEGATIVE

## 2021-08-16 NOTE — Patient Instructions (Signed)
Sarah Estes, thank you for choosing our office today! We appreciate the opportunity to meet your healthcare needs. You may receive a short survey by mail, e-mail, or through [employer]. If you are happy with your care we would appreciate if you could take just a few minutes to complete the survey questions. We read all of your comments and take your feedback very seriously. Thank you again for choosing our office.  ?Center for [employer] Team at [facility] ? ?Women's & [place] at [facility] ?([address] [place], [place] [address]) ?Entrance C, located off of [address] ?Free 24/7 valet parking  ? ?CLASSES: Go to [email or web address] to register for classes (childbirth, breastfeeding, waterbirth, infant CPR, daddy bootcamp, etc.) ? ?Call the office [phone]) or go to [facility] if: ?You begin to have strong, frequent contractions ?Your water breaks.  Sometimes it is a big gush of fluid, sometimes it is just a trickle that keeps getting your panties wet or running down your legs ?You have vaginal bleeding.  It is normal to have a small amount of spotting if your cervix was checked.  ?You don't feel your baby moving like normal.  If you don't, get you something to eat and drink and lay down and focus on feeling your baby move.   If your baby is still not moving like normal, you should call the office or go to [facility] ? ?Call the office [phone]) or go to [employer] [facility] hospital for these signs of pre-eclampsia: ?Severe headache that does not go away with Tylenol ?Visual changes- seeing spots, double, blurred vision ?Pain under your right breast or upper abdomen that does not go away with Tums or heartburn medicine ?Nausea and/or vomiting ?Severe swelling in your hands, feet, and face  ? ?Tdap Vaccine ?It is recommended that you get the Tdap vaccine during the third trimester of EACH pregnancy to help protect your baby from getting pertussis (whooping cough) ?27-36 weeks is the BEST time to do  this so that you can pass the protection on to your baby. During pregnancy is better than after pregnancy, but if you are unable to get it during pregnancy it will be offered at the hospital.  ?You can get this vaccine with [place], at the health department, your family doctor, or some local pharmacies ?Everyone who will be around your baby should also be up-to-date on their vaccines before the baby comes. Adults (who are not pregnant) only need 1 dose of Tdap during adulthood.  ? ?[place] Pediatricians/Family Doctors ?[facility]): [address] [place], [phone]           ?[facility]: [address], [phone]                ?[facility]): [place], [phone] (call to ask if accepting patients) ?[facility]: [address], [place], [place]   ? ?[place] Pediatricians/Family Doctors ?[facility]): [address], [address], [phone] ?[place]: [address] [place], [phone] ?[facility]: [place], [phone] ? ?[place]  ?[place] [facility]): [phone] ?[facility]: [place], [phone]  ? ?[place] ?[place]: [place] [address] [phone] ? ?[place]  ?[facility]: [phone], [phone] ? ?Home Blood Pressure Monitoring for Patients  ? ?Your provider has recommended that you check your  blood pressure (BP) at least once a week at home. If you do not have a blood pressure cuff at home, one will be provided for you. Contact your provider if you have not received your monitor within 1 week.  ? ?Helpful Tips for Accurate Home Blood Pressure Checks  ?Don't smoke, exercise, or drink caffeine 30 minutes before checking your BP ?Use the restroom before checking your BP (a full bladder can raise your  pressure) ?Relax in a comfortable upright chair ?Feet on the ground ?Left arm resting comfortably on a flat surface at the level of your heart ?Legs uncrossed ?Back supported ?Sit quietly and don't talk ?Place the cuff on your bare arm ?Adjust snuggly, so that only two fingertips can fit between your skin and the top of the cuff ?Check 2 readings separated by at least one minute ?Keep a log of your BP readings ?For a visual, please reference this diagram: http://ccnc.care/bpdiagram ? ?Provider Name: [facility]     Phone: [phone] ? ?Zone 1: ALL CLEAR  ?Continue to monitor your symptoms:  ?BP reading is less than 140 (top number) or less than 90 (bottom number)  ?No right upper stomach pain ?No headaches or seeing spots ?No feeling nauseated or throwing up ?No swelling in face and hands ? ?Zone 2: CAUTION ?Call your doctor's office for any of the following:  ?BP reading is greater than 140 (top number) or greater than 90 (bottom number)  ?Stomach pain under your ribs in the middle or right side ?Headaches or seeing spots ?Feeling nauseated or throwing up ?Swelling in face and hands ? ?Zone 3: EMERGENCY  ?Seek immediate medical care if you have any of the following:  ?BP reading is greater than160 (top number) or greater than 110 (bottom number) ?Severe headaches not improving with Tylenol ?Serious difficulty catching your breath ?Any worsening symptoms from Zone 2  ?Preterm Labor and Birth Information ? ?The normal length of a pregnancy is 39-41 weeks. Preterm labor is when labor starts before 37 completed weeks of pregnancy. ?What are the risk factors for preterm labor? ?Preterm labor is more likely to occur in women who: ?Have certain infections during pregnancy such as a bladder infection, sexually transmitted infection, or infection inside the uterus (chorioamnionitis). ?Have a shorter-than-normal cervix. ?Have gone into preterm labor before. ?Have had surgery on their cervix. ?Are younger than age 72  or older than age 108. ?Are African American. ?Are pregnant with twins or multiple babies (multiple gestation). ?Take street drugs or smoke while pregnant. ?Do not gain enough weight while pregnant. ?Became pregnant shortly after having been pregnant. ?What are the symptoms of preterm labor? ?Symptoms of preterm labor include: ?Cramps similar to those that can happen during a menstrual period. The cramps may happen with diarrhea. ?Pain in the abdomen or lower back. ?Regular uterine contractions that may feel like tightening of the abdomen. ?A feeling of increased pressure in the pelvis. ?Increased watery or bloody mucus discharge from the vagina. ?Water breaking (ruptured amniotic sac). ?Why is it important to recognize signs of preterm labor? ?It is important to recognize signs of preterm labor because babies who are born prematurely may not be fully developed. This can put them at an increased risk for: ?Long-term (chronic) heart and lung problems. ?Difficulty immediately after birth with regulating body systems, including blood sugar, body temperature, heart rate, and breathing rate. ?Bleeding in the brain. ?Cerebral palsy. ?Learning difficulties. ?Death. ?These risks are highest for babies who are born before 11 weeks  of pregnancy. ?How is preterm labor treated? ?Treatment depends on the length of your pregnancy, your condition, and the health of your baby. It may involve: ?Having a stitch (suture) placed in your cervix to prevent your cervix from opening too early (cerclage). ?Taking or being given medicines, such as: ?Hormone medicines. These may be given early in pregnancy to help support the pregnancy. ?Medicine to stop contractions. ?Medicines to help mature the baby?s lungs. These may be prescribed if the risk of delivery is high. ?Medicines to prevent your baby from developing cerebral palsy. ?If the labor happens before 34 weeks of pregnancy, you may need to stay in the hospital. ?What should I do if I  think I am in preterm labor? ?If you think that you are going into preterm labor, call your health care provider right away. ?How can I prevent preterm labor in future pregnancies? ?To increase your chan

## 2021-08-16 NOTE — Progress Notes (Signed)
? ?HIGH-RISK PREGNANCY VISIT ?Patient name: Sarah Estes MRN [id number]  Date of birth: 10/23/1990 ?Chief Complaint:   ?High Risk Gestation, Routine Prenatal Visit, and Non-stress Test ? ?History of Present Illness:   ?Sarah Estes is a 31 y.o. G41P1011 female at [redacted]w[redacted]d with an Estimated Date of Delivery: 09/29/21 being seen today for ongoing management of a high-risk pregnancy complicated by diabetes mellitus A2DM currently on metformin 500mg  BID .   ? ?Today she reports  fbs all <95, 2hr pp 79-132 (only 3 >120) since starting metformin on 5/8 . Contractions: Irritability.  .  Movement: Present. denies leaking of fluid.  ? ? ?  07/21/2021  ?  1:11 PM 07/05/2021  ?  8:42 AM 03/21/2021  ?  2:42 PM 09/24/2017  ? 10:44 AM 08/20/2017  ? 11:45 AM  ?Depression screen PHQ 2/9  ?Decreased Interest 0 0 0 0 0  ?Down, Depressed, Hopeless  0 0 0 0  ?PHQ - 2 Score 0 0 0 0 0  ?Altered sleeping  1 0    ?Tired, decreased energy  1 0    ?Change in appetite  0 0    ?Feeling bad or failure about yourself   0 0    ?Trouble concentrating  0 0    ?Moving slowly or fidgety/restless  0 0    ?Suicidal thoughts  0 0    ?PHQ-9 Score  2 0    ? ?  ? ?  07/05/2021  ?  8:42 AM 03/21/2021  ?  2:42 PM  ?GAD 7 : Generalized Anxiety Score  ?Nervous, Anxious, on Edge 0 1  ?Control/stop worrying 0 1  ?Worry too much - different things 0 1  ?Trouble relaxing 2 2  ?Restless 0 1  ?Easily annoyed or irritable 0 3  ?Afraid - awful might happen 1 0  ?Total GAD 7 Score 3 9  ? ? ? ?Review of Systems:   ?Pertinent items are noted in HPI ?Denies abnormal vaginal discharge w/ itching/odor/irritation, headaches, visual changes, shortness of breath, chest pain, abdominal pain, severe nausea/vomiting, or problems with urination or bowel movements unless otherwise stated above. ?Pertinent History Reviewed:  ?Reviewed past medical,surgical, social, obstetrical and family history.  ?Reviewed problem list, medications and allergies. ?Physical Assessment:  ? ?Vitals:  ?  08/16/21 0913  ?BP: 115/74  ?Pulse: 90  ?Weight: 241 lb 6.4 oz (109.5 kg)  ?Body mass index is 35.65 kg/m?. ?     ?     Physical Examination:  ? General appearance: alert, well appearing, and in no distress ? Mental status: alert, oriented to person, place, and time ? Skin: warm & dry  ? Extremities: Edema: None  ?  Cardiovascular: normal heart rate noted ? Respiratory: normal respiratory effort, no distress ? Abdomen: gravid, soft, non-tender ? Pelvic: Cervical exam deferred        ? ?Fetal Status:     Movement: Present   ? ?Fetal Surveillance Testing today: NST: FHR baseline 135 bpm, Variability: moderate, Accelerations:present, Decelerations:  Absent= Cat 1/reactive ?Toco: none   ? ?Chaperone: N/A   ? ?Results for orders placed or performed in visit on 08/16/21 (from the past 24 hour(s))  ?POC Urinalysis Dipstick OB  ? Collection Time: 08/16/21  9:35 AM  ?Result Value Ref Range  ? Color, UA    ? Clarity, UA    ? Glucose, UA Negative Negative  ? Bilirubin, UA    ? Ketones, UA neg   ? Spec Grav, UA    ?  Blood, UA neg   ? pH, UA    ? POC,PROTEIN,UA Negative Negative, Trace, Small (1+), Moderate (2+), Large (3+), 4+  ? Urobilinogen, UA    ? Nitrite, UA neg   ? Leukocytes, UA Negative Negative  ? Appearance    ? Odor    ?  ?Assessment & Plan:  ?High-risk pregnancy: G3P1011 at [redacted]w[redacted]d with an Estimated Date of Delivery: 09/29/21  ? ?1) A2DM, stable on metformin 500mg  BID ? ? ?Meds: No orders of the defined types were placed in this encounter. ? ? ?Labs/procedures today: tdap and NST ? ?Treatment Plan:  Growth u/s q4wks    2x/wk testing   Deliver @ 39-39.6wks:______  ? ?Reviewed: Preterm labor symptoms and general obstetric precautions including but not limited to vaginal bleeding, contractions, leaking of fluid and fetal movement were reviewed in detail with the patient.  All questions were answered. Does have home bp cuff. Office bp cuff given: not applicable. Check bp weekly, let us know if consistently >140 and/or  >90. ? ?Follow-up: Return for As scheduled. ? ? ?Future Appointments  ?Date Time Provider [place]  ?08/19/2021 11:10 AM [facility] NURSE [facility]  ?08/23/2021 11:10 AM [facility] NURSE [facility]  ?08/23/2021 11:50 AM Roma Schanz, CNM [facility]  ?08/26/2021 10:50 AM [facility] NURSE [facility]  ?08/31/2021 10:30 AM [facility] [place] [facility] None  ?08/31/2021 11:50 AM Myrtis Ser, CNM [facility]  ?09/06/2021 10:30 AM [facility] NURSE [facility]  ?09/06/2021 10:50 AM Florian Buff, MD [facility]  ?09/09/2021 10:30 AM [facility] NURSE [facility]  ?09/13/2021 10:30 AM [facility] NURSE [facility]  ?09/13/2021 10:50 AM Florian Buff, MD [facility]  ?09/16/2021 10:30 AM [facility] NURSE [facility]  ?09/20/2021 10:30 AM [facility] NURSE [facility]  ?09/20/2021 10:50 AM Roma Schanz, CNM [facility]  ?09/23/2021 10:30 AM [facility] NURSE [facility]  ?09/27/2021 10:30 AM [facility] NURSE [facility]  ?09/27/2021 10:50 AM Eure, Mertie Clause, MD [facility]  ? ? ?Orders Placed This Encounter  ?Procedures  ? POC Urinalysis Dipstick OB  ? ?[place], [place] ?08/16/2021 ?9:52 AM  ?

## 2021-08-19 ENCOUNTER — Ambulatory Visit (INDEPENDENT_AMBULATORY_CARE_PROVIDER_SITE_OTHER): Payer: Commercial Managed Care - PPO | Admitting: *Deleted

## 2021-08-19 VITALS — BP 111/79 | HR 91 | Wt 241.0 lb

## 2021-08-19 DIAGNOSIS — Z3A34 34 weeks gestation of pregnancy: Secondary | ICD-10-CM

## 2021-08-19 DIAGNOSIS — O24415 Gestational diabetes mellitus in pregnancy, controlled by oral hypoglycemic drugs: Secondary | ICD-10-CM

## 2021-08-19 DIAGNOSIS — O0990 Supervision of high risk pregnancy, unspecified, unspecified trimester: Secondary | ICD-10-CM

## 2021-08-19 DIAGNOSIS — Z1389 Encounter for screening for other disorder: Secondary | ICD-10-CM

## 2021-08-19 DIAGNOSIS — Z331 Pregnant state, incidental: Secondary | ICD-10-CM

## 2021-08-19 DIAGNOSIS — O288 Other abnormal findings on antenatal screening of mother: Secondary | ICD-10-CM

## 2021-08-19 LAB — POCT URINALYSIS DIPSTICK OB
Blood, UA: NEGATIVE
Glucose, UA: NEGATIVE
Ketones, UA: NEGATIVE
Leukocytes, UA: NEGATIVE
Nitrite, UA: NEGATIVE
POC,PROTEIN,UA: NEGATIVE

## 2021-08-19 NOTE — Progress Notes (Signed)
   NURSE VISIT- NST  SUBJECTIVE:  Sarah Estes is a 31 y.o. G7P1011 female at [redacted]w[redacted]d, here for a NST for pregnancy complicated by [id number] currently on Metformin .  She reports active fetal movement, contractions: none, vaginal bleeding: none, membranes: intact.   OBJECTIVE:  BP 111/79   Pulse 91   Wt 241 lb (109.3 kg)   LMP  (LMP Unknown)   BMI 35.59 kg/m   Appears well, no apparent distress  Results for orders placed or performed in visit on 08/19/21 (from the past 24 hour(s))  POC Urinalysis Dipstick OB   Collection Time: 08/19/21 11:41 AM  Result Value Ref Range   Color, UA     Clarity, UA     Glucose, UA Negative Negative   Bilirubin, UA     Ketones, UA neg    Spec Grav, UA     Blood, UA neg    pH, UA     POC,PROTEIN,UA Negative Negative, Trace, Small (1+), Moderate (2+), Large (3+), 4+   Urobilinogen, UA     Nitrite, UA neg    Leukocytes, UA Negative Negative   Appearance     Odor      NST: FHR baseline 135 bpm, Variability: moderate, Accelerations:present, Decelerations:  Absent= Cat 1/reactive Toco: none   ASSESSMENT: G3P1011 at [redacted]w[redacted]d with A2DM currently on Metformin 500BiD NST reactive  PLAN: EFM strip reviewed by Dr. Nelda Marseille   Recommendations: keep next appointment as scheduled    Alice Rieger  08/19/2021 1:03 PM

## 2021-08-23 ENCOUNTER — Ambulatory Visit (INDEPENDENT_AMBULATORY_CARE_PROVIDER_SITE_OTHER): Payer: Commercial Managed Care - PPO | Admitting: Women's Health

## 2021-08-23 ENCOUNTER — Other Ambulatory Visit: Payer: Commercial Managed Care - PPO

## 2021-08-23 ENCOUNTER — Encounter: Payer: Self-pay | Admitting: Women's Health

## 2021-08-23 VITALS — BP 109/78 | HR 91 | Wt 241.0 lb

## 2021-08-23 DIAGNOSIS — Z1389 Encounter for screening for other disorder: Secondary | ICD-10-CM

## 2021-08-23 DIAGNOSIS — Z331 Pregnant state, incidental: Secondary | ICD-10-CM

## 2021-08-23 DIAGNOSIS — O24415 Gestational diabetes mellitus in pregnancy, controlled by oral hypoglycemic drugs: Secondary | ICD-10-CM

## 2021-08-23 DIAGNOSIS — Z3483 Encounter for supervision of other normal pregnancy, third trimester: Secondary | ICD-10-CM

## 2021-08-23 DIAGNOSIS — O0990 Supervision of high risk pregnancy, unspecified, unspecified trimester: Secondary | ICD-10-CM

## 2021-08-23 LAB — POCT URINALYSIS DIPSTICK OB
Blood, UA: NEGATIVE
Glucose, UA: NEGATIVE
Ketones, UA: NEGATIVE
Leukocytes, UA: NEGATIVE
Nitrite, UA: NEGATIVE
POC,PROTEIN,UA: NEGATIVE

## 2021-08-23 NOTE — Patient Instructions (Signed)
Sarah Estes, thank you for choosing our office today! We appreciate the opportunity to meet your healthcare needs. You may receive a short survey by mail, e-mail, or through MyChart. If you are happy with your care we would appreciate if you could take just a few minutes to complete the survey questions. We read all of your comments and take your feedback very seriously. Thank you again for choosing our office.  [facility] ([address] [place], [place] [address]) Entrance C, located off of [address] Free 24/7 valet parking   CLASSES: Go to [email or web address] to register for classes (childbirth, breastfeeding, waterbirth, infant CPR, daddy bootcamp, etc.)  Call the office ([phone]) or go to [facility] if: You begin to have strong, frequent contractions Your water breaks.  Sometimes it is a big gush of fluid, sometimes it is just a trickle that keeps getting your panties wet or running down your legs You have vaginal bleeding.  It is normal to have a small amount of spotting if your cervix was checked.  You don't feel your baby moving like normal.  If you don't, get you something to eat and drink and lay down and focus on feeling your baby move.   If your baby is still not moving like normal, you should call the office or go to [facility].  Call the office ([phone]) or go to [facility] for these signs of pre-eclampsia: Severe headache that does not go away with Tylenol Visual changes- seeing spots, double, blurred vision Pain under your right breast or upper abdomen that does not go away with Tums or heartburn medicine Nausea and/or vomiting Severe swelling in your hands, feet, and face   Tdap Vaccine It is recommended that you get the Tdap vaccine during the third trimester of EACH pregnancy to help protect your baby from getting pertussis (whooping cough) 27-36 weeks is the BEST time to do  this so that you can pass the protection on to your baby. During pregnancy is better than after pregnancy, but if you are unable to get it during pregnancy it will be offered at the hospital.  You can get this vaccine with us, at the health department, your family doctor, or some local pharmacies Everyone who will be around your baby should also be up-to-date on their vaccines before the baby comes. Adults (who are not pregnant) only need 1 dose of Tdap during adulthood.   [place] Pediatricians/Family Doctors [facility] ([facility]): [address], [phone]           [facility]: [address], [phone]                [facility] ([facility]): [address], [phone] (call to ask if accepting patients) [facility]: [address], [place], [phone]    [place] Pediatricians/Family Doctors [facility] ([facility]): [address], [phone] [facility]: [address], [phone] [facility]: [address], [phone]  [place] [facility]  [facility] ([facility]): [phone] [facility]: [address], [phone]   [place] Family Doctors [facility]: [address], [phone]  [place] [facility]  [facility]: [address], [phone]  Home Blood Pressure Monitoring for Patients   Your provider has recommended that you check your   blood pressure (BP) at least once a week at home. If you do not have a blood pressure cuff at home, one will be provided for you. Contact your provider if you have not received your monitor within 1 week.   Helpful Tips for Accurate Home Blood Pressure Checks  Don't smoke, exercise, or drink caffeine 30 minutes before checking your BP Use the restroom before checking your BP (a full bladder can raise your  pressure) Relax in a comfortable upright chair Feet on the ground Left arm resting comfortably on a flat surface at the level of your heart Legs uncrossed Back supported Sit quietly and don't talk Place the cuff on your bare arm Adjust snuggly, so that only two fingertips can fit between your skin and the top of the cuff Check 2 readings separated by at least one minute Keep a log of your BP readings For a visual, please reference this diagram: http://ccnc.care/bpdiagram  Provider Name: [facility]     Phone: [phone]  Zone 1: ALL CLEAR  Continue to monitor your symptoms:  BP reading is less than 140 (top number) or less than 90 (bottom number)  No right upper stomach pain No headaches or seeing spots No feeling nauseated or throwing up No swelling in face and hands  Zone 2: CAUTION Call your doctor's office for any of the following:  BP reading is greater than 140 (top number) or greater than 90 (bottom number)  Stomach pain under your ribs in the middle or right side Headaches or seeing spots Feeling nauseated or throwing up Swelling in face and hands  Zone 3: EMERGENCY  Seek immediate medical care if you have any of the following:  BP reading is greater than160 (top number) or greater than 110 (bottom number) Severe headaches not improving with Tylenol Serious difficulty catching your breath Any worsening symptoms from Zone 2  Preterm Labor and Birth Information  The normal length of a pregnancy is 39-41 weeks. Preterm labor is when labor starts before 37 completed weeks of pregnancy. What are the risk factors for preterm labor? Preterm labor is more likely to occur in women who: Have certain infections during pregnancy such as a bladder infection, sexually transmitted infection, or infection inside the uterus (chorioamnionitis). Have a shorter-than-normal cervix. Have gone into preterm labor before. Have had surgery on their cervix. Are younger than age 17  or older than age 35. Are African American. Are pregnant with twins or multiple babies (multiple gestation). Take street drugs or smoke while pregnant. Do not gain enough weight while pregnant. Became pregnant shortly after having been pregnant. What are the symptoms of preterm labor? Symptoms of preterm labor include: Cramps similar to those that can happen during a menstrual period. The cramps may happen with diarrhea. Pain in the abdomen or lower back. Regular uterine contractions that may feel like tightening of the abdomen. A feeling of increased pressure in the pelvis. Increased watery or bloody mucus discharge from the vagina. Water breaking (ruptured amniotic sac). Why is it important to recognize signs of preterm labor? It is important to recognize signs of preterm labor because babies who are born prematurely may not be fully developed. This can put them at an increased risk for: Long-term (chronic) heart and lung problems. Difficulty immediately after birth with regulating body systems, including blood sugar, body temperature, heart rate, and breathing rate. Bleeding in the brain. Cerebral palsy. Learning difficulties. Death. These risks are highest for babies who are born before 34 weeks   of pregnancy. How is preterm labor treated? Treatment depends on the length of your pregnancy, your condition, and the health of your baby. It may involve: Having a stitch (suture) placed in your cervix to prevent your cervix from opening too early (cerclage). Taking or being given medicines, such as: Hormone medicines. These may be given early in pregnancy to help support the pregnancy. Medicine to stop contractions. Medicines to help mature the baby's lungs. These may be prescribed if the risk of delivery is high. Medicines to prevent your baby from developing cerebral palsy. If the labor happens before 34 weeks of pregnancy, you may need to stay in the hospital. What should I do if I  think I am in preterm labor? If you think that you are going into preterm labor, call your health care provider right away. How can I prevent preterm labor in future pregnancies? To increase your chance of having a full-term pregnancy: Do not use any tobacco products, such as cigarettes, chewing tobacco, and e-cigarettes. If you need help quitting, ask your health care provider. Do not use street drugs or medicines that have not been prescribed to you during your pregnancy. Talk with your health care provider before taking any herbal supplements, even if you have been taking them regularly. Make sure you gain a healthy amount of weight during your pregnancy. Watch for infection. If you think that you might have an infection, get it checked right away. Make sure to tell your health care provider if you have gone into preterm labor before. This information is not intended to replace advice given to you by your health care provider. Make sure you discuss any questions you have with your health care provider. Document Revised: 07/12/2018 Document Reviewed: 08/11/2015 Elsevier Patient Education  2020 Elsevier Inc.   

## 2021-08-23 NOTE — Progress Notes (Signed)
HIGH-RISK PREGNANCY VISIT Patient name: Sarah Estes MRN [id number]  Date of birth: 18-Jun-1990 Chief Complaint:   High Risk Gestation, Routine Prenatal Visit, and Non-stress Test  History of Present Illness:   Sarah Estes is a 31 y.o. G79P1011 female at [redacted]w[redacted]d with an Estimated Date of Delivery: 09/29/21 being seen today for ongoing management of a high-risk pregnancy complicated by diabetes mellitus A2DM currently on metformin 500mg  BID .    Today she reports  all fbs <95 but one (96), all 2hr <120 but 2 (123) . Contractions: Irritability.  .  Movement: Present. denies leaking of fluid.      07/21/2021    1:11 PM 07/05/2021    8:42 AM 03/21/2021    2:42 PM 09/24/2017   10:44 AM 08/20/2017   11:45 AM  Depression screen PHQ 2/9  Decreased Interest 0 0 0 0 0  Down, Depressed, Hopeless  0 0 0 0  PHQ - 2 Score 0 0 0 0 0  Altered sleeping  1 0    Tired, decreased energy  1 0    Change in appetite  0 0    Feeling bad or failure about yourself   0 0    Trouble concentrating  0 0    Moving slowly or fidgety/restless  0 0    Suicidal thoughts  0 0    PHQ-9 Score  2 0          07/05/2021    8:42 AM 03/21/2021    2:42 PM  GAD 7 : Generalized Anxiety Score  Nervous, Anxious, on Edge 0 1  Control/stop worrying 0 1  Worry too much - different things 0 1  Trouble relaxing 2 2  Restless 0 1  Easily annoyed or irritable 0 3  Afraid - awful might happen 1 0  Total GAD 7 Score 3 9     Review of Systems:   Pertinent items are noted in HPI Denies abnormal vaginal discharge w/ itching/odor/irritation, headaches, visual changes, shortness of breath, chest pain, abdominal pain, severe nausea/vomiting, or problems with urination or bowel movements unless otherwise stated above. Pertinent History Reviewed:  Reviewed past medical,surgical, social, obstetrical and family history.  Reviewed problem list, medications and allergies. Physical Assessment:   Vitals:   08/23/21 1104  BP:  109/78  Pulse: 91  Weight: 241 lb (109.3 kg)  Body mass index is 35.59 kg/m.           Physical Examination:   General appearance: alert, well appearing, and in no distress  Mental status: alert, oriented to person, place, and time  Skin: warm & dry   Extremities: Edema: None    Cardiovascular: normal heart rate noted  Respiratory: normal respiratory effort, no distress  Abdomen: gravid, soft, non-tender  Pelvic: Cervical exam deferred         Fetal Status: Fetal Heart Rate (bpm): 125   Movement: Present    Fetal Surveillance Testing today: NST: FHR baseline 125 bpm, Variability: moderate, Accelerations:present, Decelerations:  Absent= Cat 1/reactive Toco: irregular, mild    Chaperone: N/A    Results for orders placed or performed in visit on 08/23/21 (from the past 24 hour(s))  POC Urinalysis Dipstick OB   Collection Time: 08/23/21 11:33 AM  Result Value Ref Range   Color, UA     Clarity, UA     Glucose, UA Negative Negative   Bilirubin, UA     Ketones, UA neg    Spec Grav, UA  Blood, UA neg    pH, UA     POC,PROTEIN,UA Negative Negative, Trace, Small (1+), Moderate (2+), Large (3+), 4+   Urobilinogen, UA     Nitrite, UA neg    Leukocytes, UA Negative Negative   Appearance     Odor      Assessment & Plan:  High-risk pregnancy: G3P1011 at [redacted]w[redacted]d with an Estimated Date of Delivery: 09/29/21   1) A2DM, well controlled on metformin 500mg  BID  Meds: No orders of the defined types were placed in this encounter.  Labs/procedures today: NST  Treatment Plan:  Growth u/s q4wks    2x/wk testing     Deliver @ 39-39.6wks:______   Reviewed: Preterm labor symptoms and general obstetric precautions including but not limited to vaginal bleeding, contractions, leaking of fluid and fetal movement were reviewed in detail with the patient.  All questions were answered. Does have home bp cuff. Office bp cuff given: not applicable. Check bp weekly, let know if consistently >140  and/or >90.  Follow-up: Return for As scheduled.   Future Appointments  Date Time Provider Department Center  08/26/2021 10:50 AM [facility] NURSE [facility]  08/31/2021 10:30 AM [facility] 09/02/2021 [facility] None  08/31/2021 11:50 AM 09/02/2021, CNM [facility]  09/06/2021 10:30 AM [facility]URSE [facility]  09/06/2021 10:50 AM 11/06/2021, MD [facility]  09/09/2021 10:30 AM [facility]URSE [facility]  09/13/2021 10:30 AM [facility]  09/13/2021 10:50 AM 09/15/2021, MD [facility]  09/16/2021 10:30 AM [facility]URSE [facility]  09/20/2021 10:30 AM [facility] NURSE [facility]  09/20/2021 10:50 AM 09/22/2021, CNM [facility]  09/23/2021 10:30 AM [facility]URSE [facility]  09/27/2021 10:30 AM [facility]  09/27/2021 10:50 AM Eure, 09/29/2021, MD [facility]    Orders Placed This Encounter  Procedures   POC Urinalysis Dipstick OB   Amaryllis Dyke CNM, [facility] 08/23/2021 11:54 AM

## 2021-08-24 ENCOUNTER — Encounter: Payer: Self-pay | Admitting: *Deleted

## 2021-08-26 ENCOUNTER — Ambulatory Visit (INDEPENDENT_AMBULATORY_CARE_PROVIDER_SITE_OTHER): Payer: Commercial Managed Care - PPO | Admitting: *Deleted

## 2021-08-26 VITALS — BP 114/78 | HR 72 | Wt 241.0 lb

## 2021-08-26 DIAGNOSIS — O24415 Gestational diabetes mellitus in pregnancy, controlled by oral hypoglycemic drugs: Secondary | ICD-10-CM

## 2021-08-26 DIAGNOSIS — O0990 Supervision of high risk pregnancy, unspecified, unspecified trimester: Secondary | ICD-10-CM

## 2021-08-26 DIAGNOSIS — Z3A35 35 weeks gestation of pregnancy: Secondary | ICD-10-CM

## 2021-08-26 LAB — POCT URINALYSIS DIPSTICK OB
Blood, UA: NEGATIVE
Glucose, UA: NEGATIVE
Ketones, UA: NEGATIVE
Leukocytes, UA: NEGATIVE
Nitrite, UA: NEGATIVE
POC,PROTEIN,UA: NEGATIVE

## 2021-08-26 NOTE — Progress Notes (Signed)
   NURSE VISIT- NST  SUBJECTIVE:  Sarah Estes is a 31 y.o. G43P1011 female at [redacted]w[redacted]d, here for a NST for pregnancy complicated by A2DM currently on Metformin .  She reports active fetal movement, contractions: none, vaginal bleeding: none, membranes: intact.   OBJECTIVE:  BP 114/78   Pulse 72   Wt 241 lb (109.3 kg)   LMP  (LMP Unknown)   BMI 35.59 kg/m   Appears well, no apparent distress  Results for orders placed or performed in visit on 08/26/21 (from the past 24 hour(s))  POC Urinalysis Dipstick OB   Collection Time: 08/26/21 10:48 AM  Result Value Ref Range   Color, UA     Clarity, UA     Glucose, UA Negative Negative   Bilirubin, UA     Ketones, UA neg    Spec Grav, UA     Blood, UA neg    pH, UA     POC,PROTEIN,UA Negative Negative, Trace, Small (1+), Moderate (2+), Large (3+), 4+   Urobilinogen, UA     Nitrite, UA neg    Leukocytes, UA Negative Negative   Appearance     Odor      NST: FHR baseline 135 bpm, Variability: moderate, Accelerations:present, Decelerations:  Absent= Cat 1/reactive Toco: none   ASSESSMENT: G3P1011 at 101w1d with A2DM currently on Metformin NST reactive  PLAN: EFM strip reviewed by Philipp Deputy, CNM   Recommendations: keep next appointment as scheduled    Annamarie Dawley  08/26/2021 11:20 AM

## 2021-08-30 ENCOUNTER — Other Ambulatory Visit: Payer: Self-pay | Admitting: Women's Health

## 2021-08-30 DIAGNOSIS — O24419 Gestational diabetes mellitus in pregnancy, unspecified control: Secondary | ICD-10-CM

## 2021-08-31 ENCOUNTER — Ambulatory Visit (INDEPENDENT_AMBULATORY_CARE_PROVIDER_SITE_OTHER): Payer: Commercial Managed Care - PPO

## 2021-08-31 ENCOUNTER — Encounter: Payer: Self-pay | Admitting: Advanced Practice Midwife

## 2021-08-31 ENCOUNTER — Other Ambulatory Visit: Payer: Self-pay | Admitting: Women's Health

## 2021-08-31 ENCOUNTER — Ambulatory Visit (INDEPENDENT_AMBULATORY_CARE_PROVIDER_SITE_OTHER): Payer: Commercial Managed Care - PPO | Admitting: Advanced Practice Midwife

## 2021-08-31 VITALS — BP 119/86 | HR 109 | Wt 242.0 lb

## 2021-08-31 DIAGNOSIS — O0990 Supervision of high risk pregnancy, unspecified, unspecified trimester: Secondary | ICD-10-CM

## 2021-08-31 DIAGNOSIS — Z3A35 35 weeks gestation of pregnancy: Secondary | ICD-10-CM

## 2021-08-31 DIAGNOSIS — O24419 Gestational diabetes mellitus in pregnancy, unspecified control: Secondary | ICD-10-CM

## 2021-08-31 LAB — POCT URINALYSIS DIPSTICK OB
Blood, UA: NEGATIVE
Glucose, UA: NEGATIVE
Ketones, UA: NEGATIVE
Nitrite, UA: NEGATIVE
POC,PROTEIN,UA: NEGATIVE

## 2021-08-31 NOTE — Progress Notes (Signed)
[place] 35+6 wks,cephalic,FHR 144 bpm,BPP 8/8,anterior placenta gr 1,AFI 15.9 cm,EFW 3100 g 81%

## 2021-08-31 NOTE — Progress Notes (Signed)
HIGH-RISK PREGNANCY VISIT Patient name: Sarah Estes MRN [id number]  Date of birth: 07-27-1990 Chief Complaint:   High Risk Gestation ([place] today; having low blood sugars)  History of Present Illness:   Sarah Estes is a 31 y.o. G47P1011 female at [redacted]w[redacted]d with an Estimated Date of Delivery: 09/29/21 being seen today for ongoing management of a high-risk pregnancy complicated by diabetes mellitus A2DM currently on Metformin 500mg  bid .  Log shows double-digit values for almost every PP value (multiple hypoglycemic values), and all fastings well below 95. Isn't eating much differently than before.   [name]. Contractions: Irritability. Vag. Bleeding: None.  Movement: Present. denies leaking of fluid.      07/21/2021    1:11 PM 07/05/2021    8:42 AM 03/21/2021    2:42 PM 09/24/2017   10:44 AM 08/20/2017   11:45 AM  Depression screen PHQ 2/9  Decreased Interest 0 0 0 0 0  Down, Depressed, Hopeless  0 0 0 0  PHQ - 2 Score 0 0 0 0 0  Altered sleeping  1 0    Tired, decreased energy  1 0    Change in appetite  0 0    Feeling bad or failure about yourself   0 0    Trouble concentrating  0 0    Moving slowly or fidgety/restless  0 0    Suicidal thoughts  0 0    PHQ-9 Score  2 0          07/05/2021    8:42 AM 03/21/2021    2:42 PM  GAD 7 : Generalized Anxiety Score  Nervous, Anxious, on Edge 0 1  Control/stop worrying 0 1  Worry too much - different things 0 1  Trouble relaxing 2 2  Restless 0 1  Easily annoyed or irritable 0 3  Afraid - awful might happen 1 0  Total GAD 7 Score 3 9     Review of Systems:   Pertinent items are noted in HPI Denies abnormal vaginal discharge w/ itching/odor/irritation, headaches, visual changes, shortness of breath, chest pain, abdominal pain, severe nausea/vomiting, or problems with urination or bowel movements unless otherwise stated above. Pertinent History Reviewed:  Reviewed past medical,surgical, social, obstetrical  and family history.  Reviewed problem list, medications and allergies. Physical Assessment:   Vitals:   08/31/21 1123  BP: 119/86  Pulse: (!) 109  Weight: 242 lb (109.8 kg)  Body mass index is 35.74 kg/m.           Physical Examination:   General appearance: alert, well appearing, and in no distress  Mental status: alert, oriented to person, place, and time  Skin: warm & dry   Extremities: Edema: Trace    Cardiovascular: normal heart rate noted  Respiratory: normal respiratory effort, no distress  Abdomen: gravid, soft, non-tender  Pelvic: Cervical exam deferred         Fetal Status: Fetal Heart Rate (bpm): 144 u/s   Movement: Present    Fetal Surveillance Testing today: [place] [id number] wks,cephalic,FHR 123456 bpm,BPP [id number] placenta gr 1,AFI 15.9 cm,EFW 3100 g 81%,AC 97%    Results for orders placed or performed in visit on 08/31/21 (from the past 24 hour(s))  POC Urinalysis Dipstick OB   Collection Time: 08/31/21 11:19 AM  Result Value Ref Range   Color, UA     Clarity, UA     Glucose, UA Negative Negative   Bilirubin, UA     Ketones, UA neg  Spec Grav, UA     Blood, UA neg    pH, UA     POC,PROTEIN,UA Negative Negative, Trace, Small (1+), Moderate (2+), Large (3+), 4+   Urobilinogen, UA     Nitrite, UA neg    Leukocytes, UA Trace (A) Negative   Appearance     Odor      Assessment & Plan:  High-risk pregnancy: G3P1011 at [redacted]w[redacted]d with an Estimated Date of Delivery: 09/29/21   1) GDMA2, due to multiple hypoglycemic values will have her stop Metformin 500mg  bid and call in on Monday with her values; if FBS are <95 and PP values <120 she can go back to being A1 and can stop the weekly testing; she is requesting a 39wk IOL- sched for 09/22/21 @ [facility]; IOL form faxed via Epic and orders placed    Meds: No orders of the defined types were placed in this encounter.   Labs/procedures today: U/S  Treatment Plan:  will stop Metformin and call in with CBGs on Monday- if in normal  range may stay off meds and transition back to GDMA1 and keep weekly visits without ante testing with IOL @ 39wks  Reviewed: Preterm labor symptoms and general obstetric precautions including but not limited to vaginal bleeding, contractions, leaking of fluid and fetal movement were reviewed in detail with the patient.  All questions were answered. Does have home bp cuff. Office bp cuff given: not applicable. Check bp weekly, let us know if consistently >140 and/or >90.  Follow-up: No follow-ups on file.   Future Appointments  Date Time Provider [place]  08/31/2021 11:50 AM Myrtis Ser, CNM [facility]  09/06/2021 10:30 AM [facility] NURSE [facility]  09/06/2021 10:50 AM Florian Buff, MD [facility]  09/09/2021 10:30 AM [facility]  09/13/2021 10:30 AM [facility]  09/13/2021 10:50 AM Florian Buff, MD [facility]  09/16/2021 10:30 AM [facility]  09/20/2021 10:30 AM [facility]  09/20/2021 10:50 AM Roma Schanz, CNM [facility]  09/23/2021 10:30 AM [facility]  09/27/2021 10:30 AM [facility]  09/27/2021 10:50 AM Eure, Mertie Clause, MD [facility]    Orders Placed This Encounter  Procedures   POC Urinalysis Dipstick OB   Myrtis Ser [facility] 08/31/2021 11:38 AM

## 2021-09-02 ENCOUNTER — Encounter (HOSPITAL_COMMUNITY): Payer: Self-pay

## 2021-09-02 ENCOUNTER — Telehealth (HOSPITAL_COMMUNITY): Payer: Self-pay | Admitting: *Deleted

## 2021-09-02 NOTE — Telephone Encounter (Signed)
Preadmission screen  

## 2021-09-05 ENCOUNTER — Telehealth (HOSPITAL_COMMUNITY): Payer: Self-pay | Admitting: *Deleted

## 2021-09-05 ENCOUNTER — Encounter (HOSPITAL_COMMUNITY): Payer: Self-pay | Admitting: *Deleted

## 2021-09-05 NOTE — Telephone Encounter (Signed)
Preadmission screen  

## 2021-09-06 ENCOUNTER — Other Ambulatory Visit (HOSPITAL_COMMUNITY)
Admission: RE | Admit: 2021-09-06 | Discharge: 2021-09-06 | Disposition: A | Discharge status: Home / Self Care | Payer: Commercial Managed Care - PPO | Source: Ambulatory Visit | Attending: Obstetrics & Gynecology | Admitting: Obstetrics & Gynecology

## 2021-09-06 ENCOUNTER — Encounter: Payer: Self-pay | Admitting: Obstetrics & Gynecology

## 2021-09-06 ENCOUNTER — Other Ambulatory Visit: Payer: Commercial Managed Care - PPO

## 2021-09-06 ENCOUNTER — Ambulatory Visit (INDEPENDENT_AMBULATORY_CARE_PROVIDER_SITE_OTHER): Payer: Commercial Managed Care - PPO | Admitting: Obstetrics & Gynecology

## 2021-09-06 VITALS — BP 120/81 | HR 81 | Wt 244.6 lb

## 2021-09-06 DIAGNOSIS — Z3A36 36 weeks gestation of pregnancy: Secondary | ICD-10-CM | POA: Insufficient documentation

## 2021-09-06 DIAGNOSIS — O0990 Supervision of high risk pregnancy, unspecified, unspecified trimester: Secondary | ICD-10-CM | POA: Insufficient documentation

## 2021-09-06 DIAGNOSIS — O24415 Gestational diabetes mellitus in pregnancy, controlled by oral hypoglycemic drugs: Secondary | ICD-10-CM

## 2021-09-06 LAB — POCT URINALYSIS DIPSTICK OB
Blood, UA: NEGATIVE
Ketones, UA: NEGATIVE
Leukocytes, UA: NEGATIVE
Nitrite, UA: NEGATIVE
POC,PROTEIN,UA: NEGATIVE

## 2021-09-06 NOTE — Progress Notes (Signed)
HIGH-RISK PREGNANCY VISIT Patient name: Sarah Estes MRN [id number]  Date of birth: [birth year] Chief Complaint:   Routine Prenatal Visit and Non-stress Test  History of Present Illness:   Sarah Estes is a 31 y.o. G35P1011 female at [redacted]w[redacted]d with an Estimated Date of Delivery: 09/29/21 being seen today for ongoing management of a high-risk pregnancy complicated by diabetes mellitus A1.5.    [name]. Contractions: Irregular. Vag. Bleeding: None.  Movement: Present. denies leaking of fluid.      07/21/2021    1:11 PM 07/05/2021    8:42 AM 03/21/2021    2:42 PM 09/24/2017   10:44 AM 08/20/2017   11:45 AM  Depression screen PHQ 2/9  Decreased Interest 0 0 0 0 0  Down, Depressed, Hopeless  0 0 0 0  PHQ - 2 Score 0 0 0 0 0  Altered sleeping  1 0    Tired, decreased energy  1 0    Change in appetite  0 0    Feeling bad or failure about yourself   0 0    Trouble concentrating  0 0    Moving slowly or fidgety/restless  0 0    Suicidal thoughts  0 0    PHQ-9 Score  2 0          07/05/2021    8:42 AM 03/21/2021    2:42 PM  GAD 7 : Generalized Anxiety Score  Nervous, Anxious, on Edge 0 1  Control/stop worrying 0 1  Worry too much - different things 0 1  Trouble relaxing 2 2  Restless 0 1  Easily annoyed or irritable 0 3  Afraid - awful might happen 1 0  Total GAD 7 Score 3 9     Review of Systems:   Pertinent items are noted in HPI Denies abnormal vaginal discharge w/ itching/odor/irritation, headaches, visual changes, shortness of breath, chest pain, abdominal pain, severe nausea/vomiting, or problems with urination or bowel movements unless otherwise stated above. Pertinent History Reviewed:  Reviewed past medical,surgical, social, obstetrical and family history.  Reviewed problem list, medications and allergies. Physical Assessment:   Vitals:   09/06/21 1030  BP: 120/81  Pulse: 81  Weight: 244 lb 9.6 oz (110.9 kg)  Body mass index is 36.12  kg/m.           Physical Examination:   General appearance: alert, well appearing, and in no distress  Mental status: alert, oriented to person, place, and time  Skin: warm & dry   Extremities: Edema: Trace    Cardiovascular: normal heart rate noted  Respiratory: normal respiratory effort, no distress  Abdomen: gravid, soft, non-tender  Pelvic:  Dilation: Closed Effacement (%): Thick Station: -3  Fetal Status: Fetal Heart Rate (bpm): 145 Fundal Height: 39 cm Movement: Present Presentation: Vertex  Fetal Surveillance Testing today: Sarah Estes is at [redacted]w[redacted]d Estimated Date of Delivery: 09/29/21  NST being performed due to A1.5 DM  Today the NST is Reactive  Fetal Monitoring:  Baseline: 140 bpm, Variability: Good {> 6 bpm), Accelerations: Reactive, and Decelerations: Absent   reactive  The accelerations are >15 bpm and more than 2 in 20 minutes  Final diagnosis:  Reactive NST  Florian Buff, MD     Chaperone: Celene Squibb    Results for orders placed or performed in visit on 09/06/21 (from the past 24 hour(s))  POC Urinalysis Dipstick OB   Collection Time: 09/06/21 10:46 AM  Result Value Ref Range  Color, UA     Clarity, UA     Glucose, UA Trace (A) Negative   Bilirubin, UA     Ketones, UA neg    Spec Grav, UA     Blood, UA neg    pH, UA     POC,PROTEIN,UA Negative Negative, Trace, Small (1+), Moderate (2+), Large (3+), 4+   Urobilinogen, UA     Nitrite, UA neg    Leukocytes, UA Negative Negative   Appearance     Odor      Assessment & Plan:  High-risk pregnancy: G3P1011 at [redacted]w[redacted]d with an Estimated Date of Delivery: 09/29/21      ICD-10-CM   1. Supervision of high risk pregnancy, antepartum  O09.90 Cervicovaginal ancillary only    Culture, beta strep (group b only)    2. Gestational diabetes mellitus (GDM) in third trimester controlled on oral hypoglycemic drug  O24.415 POC Urinalysis Dipstick OB    3. [redacted] weeks gestation of pregnancy  Z3A.36 POC Urinalysis  Dipstick OB    Cervicovaginal ancillary only    Culture, beta strep (group b only)         Meds: No orders of the defined types were placed in this encounter.   Orders:  Orders Placed This Encounter  Procedures   Culture, beta strep (group b only)   POC Urinalysis Dipstick OB     Labs/procedures today: NST  Treatment Plan:  routine care   Reviewed: Term labor symptoms and general obstetric precautions including but not limited to vaginal bleeding, contractions, leaking of fluid and fetal movement were reviewed in detail with the patient.  All questions were answered. Does have home bp cuff. Office bp cuff given: not applicable. Check bp weekly, let us know if consistently >140 and/or >90.  Follow-up: Return for keep scheduled.   Future Appointments  Date Time Provider [place]  09/09/2021 10:30 AM [facility] NURSE [facility]  09/13/2021 10:30 AM [facility] NURSE [facility]  09/13/2021 10:50 AM Florian Buff, MD [facility]  09/16/2021 10:30 AM [facility] NURSE [facility]  09/20/2021 10:30 AM [facility] NURSE [facility]  09/20/2021 10:50 AM Roma Schanz, CNM [facility]  09/22/2021 12:00 AM [facility] SCHED ROOM [facility] None  09/23/2021 10:30 AM [facility] NURSE [facility]  09/27/2021 10:30 AM [facility] NURSE [facility]  09/27/2021 10:50 AM Ludmila Ebarb, Mertie Clause, MD [facility]    Orders Placed This Encounter  Procedures   Culture, beta strep (group b only)   POC Urinalysis Dipstick OB   Florian Buff  Attending Physician for [facility] 09/06/2021 11:22 AM

## 2021-09-07 LAB — CERVICOVAGINAL ANCILLARY ONLY
Chlamydia: NEGATIVE
Comment: NEGATIVE
Comment: NORMAL
Neisseria Gonorrhea: NEGATIVE

## 2021-09-09 ENCOUNTER — Other Ambulatory Visit: Payer: Commercial Managed Care - PPO

## 2021-09-09 LAB — CULTURE, BETA STREP (GROUP B ONLY): Strep Gp B Culture: POSITIVE — AB

## 2021-09-13 ENCOUNTER — Encounter (HOSPITAL_COMMUNITY): Payer: Self-pay | Admitting: Obstetrics & Gynecology

## 2021-09-13 ENCOUNTER — Ambulatory Visit (INDEPENDENT_AMBULATORY_CARE_PROVIDER_SITE_OTHER): Payer: Commercial Managed Care - PPO | Admitting: Obstetrics & Gynecology

## 2021-09-13 ENCOUNTER — Other Ambulatory Visit: Payer: Commercial Managed Care - PPO

## 2021-09-13 ENCOUNTER — Inpatient Hospital Stay (HOSPITAL_COMMUNITY)
Admission: AD | Admit: 2021-09-13 | Discharge: 2021-09-14 | Disposition: A | Discharge status: Home / Self Care | Payer: Commercial Managed Care - PPO | Attending: Obstetrics & Gynecology | Admitting: Obstetrics & Gynecology

## 2021-09-13 ENCOUNTER — Encounter: Payer: Self-pay | Admitting: Obstetrics & Gynecology

## 2021-09-13 VITALS — BP 117/81 | HR 78 | Wt 248.0 lb

## 2021-09-13 DIAGNOSIS — Z886 Allergy status to analgesic agent status: Secondary | ICD-10-CM | POA: Insufficient documentation

## 2021-09-13 DIAGNOSIS — Z3A37 37 weeks gestation of pregnancy: Secondary | ICD-10-CM | POA: Insufficient documentation

## 2021-09-13 DIAGNOSIS — O0990 Supervision of high risk pregnancy, unspecified, unspecified trimester: Secondary | ICD-10-CM

## 2021-09-13 DIAGNOSIS — O99843 Bariatric surgery status complicating pregnancy, third trimester: Secondary | ICD-10-CM | POA: Insufficient documentation

## 2021-09-13 DIAGNOSIS — Z882 Allergy status to sulfonamides status: Secondary | ICD-10-CM | POA: Insufficient documentation

## 2021-09-13 DIAGNOSIS — N930 Postcoital and contact bleeding: Secondary | ICD-10-CM

## 2021-09-13 DIAGNOSIS — Z87891 Personal history of nicotine dependence: Secondary | ICD-10-CM | POA: Insufficient documentation

## 2021-09-13 DIAGNOSIS — O24415 Gestational diabetes mellitus in pregnancy, controlled by oral hypoglycemic drugs: Secondary | ICD-10-CM

## 2021-09-13 DIAGNOSIS — O4693 Antepartum hemorrhage, unspecified, third trimester: Secondary | ICD-10-CM | POA: Insufficient documentation

## 2021-09-13 DIAGNOSIS — Z885 Allergy status to narcotic agent status: Secondary | ICD-10-CM | POA: Insufficient documentation

## 2021-09-13 LAB — POCT URINALYSIS DIPSTICK OB
Blood, UA: NEGATIVE
Glucose, UA: NEGATIVE
Ketones, UA: NEGATIVE
Leukocytes, UA: NEGATIVE
Nitrite, UA: NEGATIVE
POC,PROTEIN,UA: NEGATIVE

## 2021-09-13 NOTE — MAU Note (Signed)
.  Sarah Estes is a 31 y.o. at [redacted]w[redacted]d here in MAU reporting bright blood on tissue when wiping tonight at [place]. Since then seeing pink mucous when she wipes. Some mild abdominal cramping. Good FM and denies LOF.   Onset of complaint: tonight Pain score: 3 Vitals:   09/13/21 2158 09/13/21 2200  BP:  129/83  Pulse: 88   Resp: 17   Temp: 98 F (36.7 C)   SpO2: 100%      FHT:151 Lab orders placed from triage:  mau labor eval

## 2021-09-13 NOTE — Progress Notes (Signed)
HIGH-RISK PREGNANCY VISIT Patient name: Sarah Estes MRN [id number]  Date of birth: [birth year] Chief Complaint:   High Risk Gestation, Routine Prenatal Visit, and Non-stress Test  History of Present Illness:   Ellsie Estes is a 31 y.o. G58P1011 female at [redacted]w[redacted]d with an Estimated Date of Delivery: 09/29/21 being seen today for ongoing management of a high-risk pregnancy complicated by diabetes mellitus A2DM currently on nothing .    [name]. Contractions: Irregular. Vag. Bleeding: None.  Movement: Present. denies leaking of fluid.      07/21/2021    1:11 PM 07/05/2021    8:42 AM 03/21/2021    2:42 PM 09/24/2017   10:44 AM 08/20/2017   11:45 AM  Depression screen PHQ 2/9  Decreased Interest 0 0 0 0 0  Down, Depressed, Hopeless  0 0 0 0  PHQ - 2 Score 0 0 0 0 0  Altered sleeping  1 0    Tired, decreased energy  1 0    Change in appetite  0 0    Feeling bad or failure about yourself   0 0    Trouble concentrating  0 0    Moving slowly or fidgety/restless  0 0    Suicidal thoughts  0 0    PHQ-9 Score  2 0          07/05/2021    8:42 AM 03/21/2021    2:42 PM  GAD 7 : Generalized Anxiety Score  Nervous, Anxious, on Edge 0 1  Control/stop worrying 0 1  Worry too much - different things 0 1  Trouble relaxing 2 2  Restless 0 1  Easily annoyed or irritable 0 3  Afraid - awful might happen 1 0  Total GAD 7 Score 3 9     Review of Systems:   Pertinent items are noted in HPI Denies abnormal vaginal discharge w/ itching/odor/irritation, headaches, visual changes, shortness of breath, chest pain, abdominal pain, severe nausea/vomiting, or problems with urination or bowel movements unless otherwise stated above. Pertinent History Reviewed:  Reviewed past medical,surgical, social, obstetrical and family history.  Reviewed problem list, medications and allergies. Physical Assessment:   Vitals:   09/13/21 1006  BP: 117/81  Pulse: 78  Weight: 248 lb  (112.5 kg)  Body mass index is 36.62 kg/m.           Physical Examination:   General appearance: alert, well appearing, and in no distress and ill-appearing  Mental status: alert, oriented to person, place, and time  Skin: warm & dry   Extremities: Edema: Trace    Cardiovascular: normal heart rate noted  Respiratory: normal respiratory effort, no distress  Abdomen: gravid, soft, non-tender  Pelvic: Cervical exam deferred         Fetal Status:     Movement: Present    Fetal Surveillance Testing today: Reactive NST  [name] is at [redacted]w[redacted]d Estimated Date of Delivery: 09/29/21  NST being performed due to A2/1 DM  Today the NST is Reactive  Fetal Monitoring:  Baseline: 140 bpm, Variability: Good {> 6 bpm), Accelerations: Reactive, and Decelerations: Absent   reactive  The accelerations are >15 bpm and more than 2 in 20 minutes  Final diagnosis:  Reactive NST  Florian Buff, MD     Chaperone: N/A    Results for orders placed or performed in visit on 09/13/21 (from the past 24 hour(s))  POC Urinalysis Dipstick OB   Collection Time: 09/13/21 10:14 AM  Result Value Ref Range   Color, UA     Clarity, UA     Glucose, UA Negative Negative   Bilirubin, UA     Ketones, UA neg    Spec Grav, UA     Blood, UA neg    pH, UA     POC,PROTEIN,UA Negative Negative, Trace, Small (1+), Moderate (2+), Large (3+), 4+   Urobilinogen, UA     Nitrite, UA neg    Leukocytes, UA Negative Negative   Appearance     Odor      Assessment & Plan:  High-risk pregnancy: G3P1011 at [redacted]w[redacted]d with an Estimated Date of Delivery: 09/29/21      ICD-10-CM   1. Supervision of high risk pregnancy, antepartum  O09.90 POC Urinalysis Dipstick OB    2. [redacted] weeks gestation of pregnancy  Z3A.37 POC Urinalysis Dipstick OB    3. Gestational diabetes mellitus (GDM) in third trimester controlled on oral hypoglycemic drug  O24.415    Held due to low sugars        Meds: No orders of the defined types were  placed in this encounter.   Orders:  Orders Placed This Encounter  Procedures   POC Urinalysis Dipstick OB     Labs/procedures today: NST  Treatment Plan:  per plan    Follow-up: Return for keep scheduled.   Future Appointments  Date Time Provider [place]  09/16/2021 10:30 AM [facility] NURSE [facility]  09/20/2021 10:30 AM [facility] NURSE [facility]  09/20/2021 10:50 AM Roma Schanz, CNM [facility]  09/22/2021 12:00 AM [facility] SCHED ROOM [facility] None  09/23/2021 10:30 AM [facility] NURSE [facility]  09/27/2021 10:30 AM [facility]  09/27/2021 10:50 AM Tycho Cheramie, Mertie Clause, MD [facility]    Orders Placed This Encounter  Procedures   POC Urinalysis Dipstick OB   Florian Buff  Attending Physician for [facility] 09/13/2021 10:54 AM
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
## 2021-09-14 ENCOUNTER — Other Ambulatory Visit: Payer: Self-pay | Admitting: Advanced Practice Midwife

## 2021-09-14 DIAGNOSIS — Z885 Allergy status to narcotic agent status: Secondary | ICD-10-CM | POA: Diagnosis not present

## 2021-09-14 DIAGNOSIS — Z882 Allergy status to sulfonamides status: Secondary | ICD-10-CM | POA: Diagnosis not present

## 2021-09-14 DIAGNOSIS — Z87891 Personal history of nicotine dependence: Secondary | ICD-10-CM | POA: Diagnosis not present

## 2021-09-14 DIAGNOSIS — N930 Postcoital and contact bleeding: Secondary | ICD-10-CM | POA: Diagnosis not present

## 2021-09-14 DIAGNOSIS — O99843 Bariatric surgery status complicating pregnancy, third trimester: Secondary | ICD-10-CM | POA: Diagnosis not present

## 2021-09-14 DIAGNOSIS — Z886 Allergy status to analgesic agent status: Secondary | ICD-10-CM | POA: Diagnosis not present

## 2021-09-14 DIAGNOSIS — Z3A37 37 weeks gestation of pregnancy: Secondary | ICD-10-CM | POA: Diagnosis not present

## 2021-09-14 DIAGNOSIS — O4693 Antepartum hemorrhage, unspecified, third trimester: Secondary | ICD-10-CM | POA: Diagnosis present

## 2021-09-16 ENCOUNTER — Ambulatory Visit (INDEPENDENT_AMBULATORY_CARE_PROVIDER_SITE_OTHER): Payer: Commercial Managed Care - PPO | Admitting: *Deleted

## 2021-09-16 VITALS — BP 123/79 | HR 81 | Wt 247.6 lb

## 2021-09-16 DIAGNOSIS — Z3A38 38 weeks gestation of pregnancy: Secondary | ICD-10-CM

## 2021-09-16 DIAGNOSIS — O24415 Gestational diabetes mellitus in pregnancy, controlled by oral hypoglycemic drugs: Secondary | ICD-10-CM

## 2021-09-16 DIAGNOSIS — O288 Other abnormal findings on antenatal screening of mother: Secondary | ICD-10-CM

## 2021-09-16 DIAGNOSIS — O0990 Supervision of high risk pregnancy, unspecified, unspecified trimester: Secondary | ICD-10-CM

## 2021-09-16 DIAGNOSIS — Z331 Pregnant state, incidental: Secondary | ICD-10-CM

## 2021-09-16 DIAGNOSIS — Z1389 Encounter for screening for other disorder: Secondary | ICD-10-CM

## 2021-09-16 LAB — POCT URINALYSIS DIPSTICK OB
Blood, UA: NEGATIVE
Ketones, UA: NEGATIVE
Leukocytes, UA: NEGATIVE
Nitrite, UA: NEGATIVE
POC,PROTEIN,UA: NEGATIVE

## 2021-09-16 NOTE — Progress Notes (Signed)
   NURSE VISIT- NST  SUBJECTIVE:  Sarah Estes is a 31 y.o. G70P1011 female at [redacted]w[redacted]d, here for a NST for pregnancy complicated by A2DM currently on Metformin 500mg  BID .  She reports active fetal movement, contractions: none, vaginal bleeding: none, membranes: intact.   OBJECTIVE:  BP 123/79   Pulse 81   Wt 247 lb 9.6 oz (112.3 kg)   LMP  (LMP Unknown)   BMI 36.56 kg/m   Appears well, no apparent distress  Results for orders placed or performed in visit on 09/16/21 (from the past 24 hour(s))  POC Urinalysis Dipstick OB   Collection Time: 09/16/21 10:59 AM  Result Value Ref Range   Color, UA     Clarity, UA     Glucose, UA Moderate (2+) (A) Negative   Bilirubin, UA     Ketones, UA neg    Spec Grav, UA     Blood, UA neg    pH, UA     POC,PROTEIN,UA Negative Negative, Trace, Small (1+), Moderate (2+), Large (3+), 4+   Urobilinogen, UA     Nitrite, UA neg    Leukocytes, UA Negative Negative   Appearance     Odor      NST: FHR baseline 145 bpm, Variability: moderate, Accelerations:present, Decelerations:  Absent= Cat 1/reactive Toco: occasional   ASSESSMENT: G3P1011 at [redacted]w[redacted]d with A2DM currently on Metformin NST reactive  PLAN: EFM strip reviewed by Dr. [redacted]w[redacted]d   Recommendations: keep next appointment as scheduled    Despina Hidden  09/16/2021 11:57 AM

## 2021-09-20 ENCOUNTER — Encounter: Payer: Self-pay | Admitting: Women's Health

## 2021-09-20 ENCOUNTER — Other Ambulatory Visit: Payer: Commercial Managed Care - PPO

## 2021-09-20 ENCOUNTER — Ambulatory Visit (INDEPENDENT_AMBULATORY_CARE_PROVIDER_SITE_OTHER): Payer: Commercial Managed Care - PPO | Admitting: Women's Health

## 2021-09-20 VITALS — BP 111/80 | HR 90 | Wt 247.3 lb

## 2021-09-20 DIAGNOSIS — O0993 Supervision of high risk pregnancy, unspecified, third trimester: Secondary | ICD-10-CM

## 2021-09-20 DIAGNOSIS — O24410 Gestational diabetes mellitus in pregnancy, diet controlled: Secondary | ICD-10-CM | POA: Diagnosis not present

## 2021-09-20 DIAGNOSIS — O368130 Decreased fetal movements, third trimester, not applicable or unspecified: Secondary | ICD-10-CM

## 2021-09-20 DIAGNOSIS — Z331 Pregnant state, incidental: Secondary | ICD-10-CM

## 2021-09-20 DIAGNOSIS — Z1389 Encounter for screening for other disorder: Secondary | ICD-10-CM

## 2021-09-20 DIAGNOSIS — O0990 Supervision of high risk pregnancy, unspecified, unspecified trimester: Secondary | ICD-10-CM

## 2021-09-20 LAB — POCT URINALYSIS DIPSTICK OB
Blood, UA: NEGATIVE
Glucose, UA: NEGATIVE
Ketones, UA: NEGATIVE
Leukocytes, UA: NEGATIVE
Nitrite, UA: NEGATIVE
POC,PROTEIN,UA: NEGATIVE

## 2021-09-20 NOTE — Progress Notes (Signed)
HIGH-RISK PREGNANCY VISIT Patient name: Sarah Estes MRN [id number]  Date of birth: Aug 28, 1990 Chief Complaint:   Routine Prenatal Visit, High Risk Gestation, and Non-stress Test  History of Present Illness:   Sarah Estes is a 31 y.o. G79P1011 female at [redacted]w[redacted]d with an Estimated Date of Delivery: 09/29/21 being seen today for ongoing management of a high-risk pregnancy complicated by diabetes mellitus A2DM now back to A1DM.    Today she reports  fbs all <95 but one (98), 2hr pp all <120 but one (123) . Contractions: Irregular.  .  Movement: (!) Decreased. denies leaking of fluid.      07/21/2021    1:11 PM 07/05/2021    8:42 AM 03/21/2021    2:42 PM 09/24/2017   10:44 AM 08/20/2017   11:45 AM  Depression screen PHQ 2/9  Decreased Interest 0 0 0 0 0  Down, Depressed, Hopeless  0 0 0 0  PHQ - 2 Score 0 0 0 0 0  Altered sleeping  1 0    Tired, decreased energy  1 0    Change in appetite  0 0    Feeling bad or failure about yourself   0 0    Trouble concentrating  0 0    Moving slowly or fidgety/restless  0 0    Suicidal thoughts  0 0    PHQ-9 Score  2 0          07/05/2021    8:42 AM 03/21/2021    2:42 PM  GAD 7 : Generalized Anxiety Score  Nervous, Anxious, on Edge 0 1  Control/stop worrying 0 1  Worry too much - different things 0 1  Trouble relaxing 2 2  Restless 0 1  Easily annoyed or irritable 0 3  Afraid - awful might happen 1 0  Total GAD 7 Score 3 9     Review of Systems:   Pertinent items are noted in HPI Denies abnormal vaginal discharge w/ itching/odor/irritation, headaches, visual changes, shortness of breath, chest pain, abdominal pain, severe nausea/vomiting, or problems with urination or bowel movements unless otherwise stated above. Pertinent History Reviewed:  Reviewed past medical,surgical, social, obstetrical and family history.  Reviewed problem list, medications and allergies. Physical Assessment:   Vitals:   09/20/21 1020  BP: 111/80   Pulse: 90  Weight: 247 lb 4.8 oz (112.2 kg)  Body mass index is 36.52 kg/m.           Physical Examination:   General appearance: alert, well appearing, and in no distress  Mental status: alert, oriented to person, place, and time  Skin: warm & dry   Extremities: Edema: None    Cardiovascular: normal heart rate noted  Respiratory: normal respiratory effort, no distress  Abdomen: gravid, soft, non-tender  Pelvic: Cervical exam deferred         Fetal Status:     Movement: (!) Decreased    Fetal Surveillance Testing today: NST: FHR baseline 135 bpm, Variability: moderate, Accelerations:present, Decelerations:  Absent= Cat 1/reactive Toco: none    Chaperone: N/A    Results for orders placed or performed in visit on 09/20/21 (from the past 24 hour(s))  POC Urinalysis Dipstick OB   Collection Time: 09/20/21 10:37 AM  Result Value Ref Range   Color, UA     Clarity, UA     Glucose, UA Negative Negative   Bilirubin, UA     Ketones, UA neg    Spec Grav, UA  Blood, UA neg    pH, UA     POC,PROTEIN,UA Negative Negative, Trace, Small (1+), Moderate (2+), Large (3+), 4+   Urobilinogen, UA     Nitrite, UA neg    Leukocytes, UA Negative Negative   Appearance     Odor      Assessment & Plan:  High-risk pregnancy: G3P1011 at [redacted]w[redacted]d with an Estimated Date of Delivery: 09/29/21   1) A1DM, stable (previously A2DM), EFW 81% @ 35.6wks, IOL as scheduled 6/22 [place]  2) Decreased fm> reactive NST, reviewed [facility], reasons to seek care  Meds: No orders of the defined types were placed in this encounter.   Labs/procedures today: NST  Treatment Plan:  IOL 6/22 as scheduled  Reviewed: Term labor symptoms and general obstetric precautions including but not limited to vaginal bleeding, contractions, leaking of fluid and fetal movement were reviewed in detail with the patient.  All questions were answered. Does have home bp cuff. Office bp cuff given: not applicable. Check bp weekly, let us know  if consistently >140 and/or >90.  Follow-up: No follow-ups on file.   Future Appointments  Date Time Provider Department Center  09/20/2021 10:50 AM Cheral Marker, CNM [facility]  09/22/2021 12:00 AM [facility] SCHED ROOM [facility] None    Orders Placed This Encounter  Procedures   POC Urinalysis Dipstick OB   Cheral Marker CNM, [facility] 09/20/2021 10:48 AM

## 2021-09-20 NOTE — Patient Instructions (Signed)
Sarah Estes, thank you for choosing our office today! We appreciate the opportunity to meet your healthcare needs. You may receive a short survey by mail, e-mail, or through [employer]. If you are happy with your care we would appreciate if you could take just a few minutes to complete the survey questions. We read all of your comments and take your feedback very seriously. Thank you again for choosing our office.  Center for [employer] Team at [facility]  [facility] at [facility] ([address] [place], [place] [address]) Entrance C, located off of [address] Free 24/7 valet parking   CLASSES: Go to [email or web address] to register for classes (childbirth, breastfeeding, waterbirth, infant CPR, daddy bootcamp, etc.)  Call the office [phone]) or go to [facility] if: You begin to have strong, frequent contractions Your water breaks.  Sometimes it is a big gush of fluid, sometimes it is just a trickle that keeps getting your panties wet or running down your legs You have vaginal bleeding.  It is normal to have a small amount of spotting if your cervix was checked.  You don't feel your baby moving like normal.  If you don't, get you something to eat and drink and lay down and focus on feeling your baby move.   If your baby is still not moving like normal, you should call the office or go to [facility].  Call the office [phone]) or go to [facility] hospital for these signs of pre-eclampsia: Severe headache that does not go away with Tylenol Visual changes- seeing spots, double, blurred vision Pain under your right breast or upper abdomen that does not go away with Tums or heartburn medicine Nausea and/or vomiting Severe swelling in your hands, feet, and face   [facility] Pediatricians/Family Doctors [facility]): [address] Colette Ribas, [phone]           [facility]: [address], [phone]                 [facility]): [address], [phone] (call to ask if accepting patients) [facility] Department: [address], [place], [phone]    [facility] Pediatricians/Family Doctors [facility]): [address], [phone] [facility]: [address] [place], [phone] [facility]: [address], [phone]  [facility] Doctors  [facility]): [phone] [facility]: [address] [phone]   [facility] Doctors [facility]: [address] [phone]  [facility] Doctors  [facility]: [phone], [phone]  Home Blood Pressure Monitoring for Patients   Your provider has recommended that you check your blood pressure (BP) at least once a week at home. If you do not have a blood pressure cuff at home, one will be provided for you. Contact your provider if you have not received your monitor within 1 week.   Helpful Tips for Accurate Home Blood Pressure Checks  Don't smoke, exercise, or drink caffeine 30 minutes before checking your BP Use the restroom before checking your BP (a full bladder can raise your pressure) Relax in a comfortable upright chair Feet on the ground Left arm resting comfortably on a flat surface at the level of your heart Legs uncrossed Back supported Sit quietly and don't talk Place the cuff on your bare arm Adjust snuggly, so that only two fingertips  can fit between your skin and the top of the cuff Check 2 readings separated by at least one minute Keep a log of your BP readings For a visual, please reference this diagram: http://ccnc.care/bpdiagram  Provider Name: [facility]     Phone: [phone]  Zone 1: ALL CLEAR  Continue to monitor your symptoms:  BP reading is less than 140 (top number) or less than 90 (bottom number)  No right  upper stomach pain No headaches or seeing spots No feeling nauseated or throwing up No swelling in face and hands  Zone 2: CAUTION Call your doctor's office for any of the following:  BP reading is greater than 140 (top number) or greater than 90 (bottom number)  Stomach pain under your ribs in the middle or right side Headaches or seeing spots Feeling nauseated or throwing up Swelling in face and hands  Zone 3: EMERGENCY  Seek immediate medical care if you have any of the following:  BP reading is greater than160 (top number) or greater than 110 (bottom number) Severe headaches not improving with Tylenol Serious difficulty catching your breath Any worsening symptoms from Zone 2   Braxton Hicks Contractions Contractions of the uterus can occur throughout pregnancy, but they are not always a sign that you are in labor. You may have practice contractions called Braxton Hicks contractions. These false labor contractions are sometimes confused with true labor. What are [place] contractions? Braxton Hicks contractions are tightening movements that occur in the muscles of the uterus before labor. Unlike true labor contractions, these contractions do not result in opening (dilation) and thinning of the cervix. Toward the end of pregnancy (32-34 weeks), Braxton Hicks contractions can happen more often and may become stronger. These contractions are sometimes difficult to tell apart from true labor because they can be very uncomfortable. You should not feel embarrassed if you go to the hospital with false labor. Sometimes, the only way to tell if you are in true labor is for your health care provider to look for changes in the cervix. The health care provider will do a physical exam and may monitor your contractions. If you are not in true labor, the exam should show that your cervix is not dilating and your water has not broken. If there are no other health problems associated with your  pregnancy, it is completely safe for you to be sent home with false labor. You may continue to have Braxton Hicks contractions until you go into true labor. How to tell the difference between true labor and false labor True labor Contractions last 30-70 seconds. Contractions become very regular. Discomfort is usually felt in the top of the uterus, and it spreads to the lower abdomen and low back. Contractions do not go away with walking. Contractions usually become more intense and increase in frequency. The cervix dilates and gets thinner. False labor Contractions are usually shorter and not as strong as true labor contractions. Contractions are usually irregular. Contractions are often felt in the front of the lower abdomen and in the groin. Contractions may go away when you walk around or change positions while lying down. Contractions get weaker and are shorter-lasting as time goes on. The cervix usually does not dilate or become thin. Follow these instructions at home:  Take over-the-counter and prescription medicines only as told by your health care provider. Keep up with your usual exercises and follow other instructions from your health care provider. Eat and drink lightly if you think  you are going into labor. If Braxton Hicks contractions are making you uncomfortable: Change your position from lying down or resting to walking, or change from walking to resting. Sit and rest in a tub of warm water. Drink enough fluid to keep your urine pale yellow. Dehydration may cause these contractions. Do slow and deep breathing several times an hour. Keep all follow-up prenatal visits as told by your health care provider. This is important. Contact a health care provider if: You have a fever. You have continuous pain in your abdomen. Get help right away if: Your contractions become stronger, more regular, and closer together. You have fluid leaking or gushing from your vagina. You pass  blood-tinged mucus (bloody show). You have bleeding from your vagina. You have low back pain that you never had before. You feel your baby's head pushing down and causing pelvic pressure. Your baby is not moving inside you as much as it used to. Summary Contractions that occur before labor are called Braxton Hicks contractions, false labor, or practice contractions. Braxton Hicks contractions are usually shorter, weaker, farther apart, and less regular than true labor contractions. True labor contractions usually become progressively stronger and regular, and they become more frequent. Manage discomfort from [facility] contractions by changing position, resting in a warm bath, drinking plenty of water, or practicing deep breathing. This information is not intended to replace advice given to you by your health care provider. Make sure you discuss any questions you have with your health care provider. Document Revised: 03/02/2017 Document Reviewed: 08/03/2016 Elsevier Patient Education  [place].

## 2021-09-21 ENCOUNTER — Other Ambulatory Visit: Payer: Self-pay

## 2021-09-22 ENCOUNTER — Inpatient Hospital Stay (HOSPITAL_COMMUNITY): Payer: Commercial Managed Care - PPO | Admitting: Anesthesiology

## 2021-09-22 ENCOUNTER — Inpatient Hospital Stay (HOSPITAL_COMMUNITY)
Admission: RE | Admit: 2021-09-22 | Discharge: 2021-09-22 | Disposition: A | Discharge status: Home / Self Care | Payer: Commercial Managed Care - PPO | Source: Ambulatory Visit | Attending: Obstetrics and Gynecology | Admitting: Obstetrics and Gynecology

## 2021-09-22 ENCOUNTER — Inpatient Hospital Stay (HOSPITAL_COMMUNITY)
Admission: AD | Admit: 2021-09-22 | Discharge: 2021-09-23 | DRG: 807 | Disposition: A | Discharge status: Home / Self Care | Payer: Commercial Managed Care - PPO | Attending: Obstetrics and Gynecology | Admitting: Obstetrics and Gynecology

## 2021-09-22 ENCOUNTER — Encounter (HOSPITAL_COMMUNITY): Payer: Self-pay | Admitting: Obstetrics & Gynecology

## 2021-09-22 DIAGNOSIS — Z3A39 39 weeks gestation of pregnancy: Secondary | ICD-10-CM

## 2021-09-22 DIAGNOSIS — Z87891 Personal history of nicotine dependence: Secondary | ICD-10-CM

## 2021-09-22 DIAGNOSIS — O24420 Gestational diabetes mellitus in childbirth, diet controlled: Principal | ICD-10-CM | POA: Diagnosis present

## 2021-09-22 DIAGNOSIS — O4202 Full-term premature rupture of membranes, onset of labor within 24 hours of rupture: Secondary | ICD-10-CM | POA: Diagnosis not present

## 2021-09-22 DIAGNOSIS — O0990 Supervision of high risk pregnancy, unspecified, unspecified trimester: Secondary | ICD-10-CM

## 2021-09-22 DIAGNOSIS — O99824 Streptococcus B carrier state complicating childbirth: Secondary | ICD-10-CM | POA: Diagnosis present

## 2021-09-22 DIAGNOSIS — Z9884 Bariatric surgery status: Secondary | ICD-10-CM

## 2021-09-22 DIAGNOSIS — O24419 Gestational diabetes mellitus in pregnancy, unspecified control: Secondary | ICD-10-CM | POA: Diagnosis present

## 2021-09-22 DIAGNOSIS — O99844 Bariatric surgery status complicating childbirth: Secondary | ICD-10-CM | POA: Diagnosis present

## 2021-09-22 DIAGNOSIS — O99820 Streptococcus B carrier state complicating pregnancy: Secondary | ICD-10-CM | POA: Diagnosis not present

## 2021-09-22 LAB — CBC
HCT: 31.8 % — ABNORMAL LOW (ref 36.0–46.0)
Hemoglobin: 10.8 g/dL — ABNORMAL LOW (ref 12.0–15.0)
MCH: 28.4 pg (ref 26.0–34.0)
MCHC: 34 g/dL (ref 30.0–36.0)
MCV: 83.7 fL (ref 80.0–100.0)
Platelets: 319 10*3/uL (ref 150–400)
RBC: 3.8 MIL/uL — ABNORMAL LOW (ref 3.87–5.11)
RDW: 17.3 % — ABNORMAL HIGH (ref 11.5–15.5)
WBC: 11.4 10*3/uL — ABNORMAL HIGH (ref 4.0–10.5)
nRBC: 0 % (ref 0.0–0.2)

## 2021-09-22 LAB — GLUCOSE, CAPILLARY
Glucose-Capillary: 87 mg/dL (ref 70–99)
Glucose-Capillary: 83 mg/dL (ref 70–99)
Glucose-Capillary: 77 mg/dL (ref 70–99)

## 2021-09-22 LAB — TYPE AND SCREEN
ABO/RH(D): A POS
Antibody Screen: NEGATIVE

## 2021-09-22 LAB — RPR: RPR Ser Ql: NONREACTIVE

## 2021-09-22 MED ORDER — LACTATED RINGERS IV SOLN: 500.0000 mL | INTRAVENOUS | Status: DC | PRN

## 2021-09-22 MED ORDER — DIPHENHYDRAMINE HCL 50 MG/ML IJ SOLN: 12.5000 mg | INTRAMUSCULAR | Status: DC | PRN

## 2021-09-22 MED ORDER — ACETAMINOPHEN 325 MG PO TABS: 650.0000 mg | ORAL_TABLET | ORAL | Status: DC | PRN

## 2021-09-22 MED ORDER — PENICILLIN G POT IN DEXTROSE 60000 UNIT/ML IV SOLN
3.0000 10*6.[IU] | INTRAVENOUS | Status: DC
  Administered 2021-09-22 (×2): 3 10*6.[IU] via INTRAVENOUS
  Filled 2021-09-22 (×4): qty 50

## 2021-09-22 MED ORDER — TETANUS-DIPHTH-ACELL PERTUSSIS 5-2.5-18.5 LF-MCG/0.5 IM SUSY: 0.5000 mL | PREFILLED_SYRINGE | Freq: Once | INTRAMUSCULAR | Status: DC

## 2021-09-22 MED ORDER — EPHEDRINE 5 MG/ML INJ: 10.0000 mg | INTRAVENOUS | Status: DC | PRN

## 2021-09-22 MED ORDER — ONDANSETRON HCL 4 MG/2ML IJ SOLN: 4.0000 mg | INTRAMUSCULAR | Status: DC | PRN

## 2021-09-22 MED ORDER — OXYCODONE HCL 5 MG PO TABS: 10.0000 mg | ORAL_TABLET | ORAL | Status: DC | PRN

## 2021-09-22 MED ORDER — FENTANYL-BUPIVACAINE-NACL 0.5-0.125-0.9 MG/250ML-% EP SOLN
12.0000 mL/h | EPIDURAL | Status: DC | PRN
  Administered 2021-09-22: 12 mL/h via EPIDURAL
  Filled 2021-09-22: qty 250

## 2021-09-22 MED ORDER — SIMETHICONE 80 MG PO CHEW: 80.0000 mg | CHEWABLE_TABLET | ORAL | Status: DC | PRN

## 2021-09-22 MED ORDER — DIBUCAINE (PERIANAL) 1 % EX OINT
1.0000 | TOPICAL_OINTMENT | CUTANEOUS | Status: DC | PRN
  Filled 2021-09-22: qty 28

## 2021-09-22 MED ORDER — LIDOCAINE HCL (PF) 1 % IJ SOLN
INTRAMUSCULAR | Status: DC | PRN
  Administered 2021-09-22: 6 mL via EPIDURAL

## 2021-09-22 MED ORDER — MISOPROSTOL 25 MCG QUARTER TABLET
25.0000 ug | ORAL_TABLET | ORAL | Status: DC | PRN
  Filled 2021-09-22: qty 1

## 2021-09-22 MED ORDER — SENNOSIDES-DOCUSATE SODIUM 8.6-50 MG PO TABS
2.0000 | ORAL_TABLET | Freq: Every day | ORAL | Status: DC
  Administered 2021-09-23: 2 via ORAL
  Filled 2021-09-22: qty 2

## 2021-09-22 MED ORDER — OXYTOCIN BOLUS FROM INFUSION
333.0000 mL | Freq: Once | INTRAVENOUS | Status: AC
  Administered 2021-09-22: 333 mL via INTRAVENOUS

## 2021-09-22 MED ORDER — TERBUTALINE SULFATE 1 MG/ML IJ SOLN: 0.2500 mg | Freq: Once | INTRAMUSCULAR | Status: DC | PRN

## 2021-09-22 MED ORDER — LACTATED RINGERS IV SOLN: 500.0000 mL | Freq: Once | INTRAVENOUS | Status: DC

## 2021-09-22 MED ORDER — BENZOCAINE-MENTHOL 20-0.5 % EX AERO
1.0000 | INHALATION_SPRAY | CUTANEOUS | Status: DC | PRN
  Administered 2021-09-22: 1 via TOPICAL
  Filled 2021-09-22: qty 56

## 2021-09-22 MED ORDER — LACTATED RINGERS IV SOLN: INTRAVENOUS | Status: DC

## 2021-09-22 MED ORDER — LIDOCAINE HCL (PF) 1 % IJ SOLN: 30.0000 mL | INTRAMUSCULAR | Status: DC | PRN

## 2021-09-22 MED ORDER — MEASLES, MUMPS & RUBELLA VAC IJ SOLR: 0.5000 mL | Freq: Once | INTRAMUSCULAR | Status: DC

## 2021-09-22 MED ORDER — PRENATAL MULTIVITAMIN CH
1.0000 | ORAL_TABLET | Freq: Every day | ORAL | Status: DC
  Administered 2021-09-23: 1 via ORAL
  Filled 2021-09-22: qty 1

## 2021-09-22 MED ORDER — PHENYLEPHRINE 80 MCG/ML (10ML) SYRINGE FOR IV PUSH (FOR BLOOD PRESSURE SUPPORT): 80.0000 ug | PREFILLED_SYRINGE | INTRAVENOUS | Status: DC | PRN

## 2021-09-22 MED ORDER — SODIUM CHLORIDE 0.9 % IV SOLN
5.0000 10*6.[IU] | Freq: Once | INTRAVENOUS | Status: AC
  Administered 2021-09-22: 5 10*6.[IU] via INTRAVENOUS
  Filled 2021-09-22: qty 5

## 2021-09-22 MED ORDER — DIPHENHYDRAMINE HCL 25 MG PO CAPS: 25.0000 mg | ORAL_CAPSULE | Freq: Four times a day (QID) | ORAL | Status: DC | PRN

## 2021-09-22 MED ORDER — ONDANSETRON HCL 4 MG/2ML IJ SOLN: 4.0000 mg | Freq: Four times a day (QID) | INTRAMUSCULAR | Status: DC | PRN

## 2021-09-22 MED ORDER — WITCH HAZEL-GLYCERIN EX PADS: 1.0000 | MEDICATED_PAD | CUTANEOUS | Status: DC | PRN

## 2021-09-22 MED ORDER — ACETAMINOPHEN 325 MG PO TABS
650.0000 mg | ORAL_TABLET | ORAL | Status: DC | PRN
  Administered 2021-09-22 – 2021-09-23 (×3): 650 mg via ORAL
  Filled 2021-09-22 (×4): qty 2

## 2021-09-22 MED ORDER — OXYCODONE-ACETAMINOPHEN 5-325 MG PO TABS: 2.0000 | ORAL_TABLET | ORAL | Status: DC | PRN

## 2021-09-22 MED ORDER — FENTANYL CITRATE (PF) 100 MCG/2ML IJ SOLN: 100.0000 ug | INTRAMUSCULAR | Status: DC | PRN

## 2021-09-22 MED ORDER — SOD CITRATE-CITRIC ACID 500-334 MG/5ML PO SOLN: 30.0000 mL | ORAL | Status: DC | PRN

## 2021-09-22 MED ORDER — OXYCODONE-ACETAMINOPHEN 5-325 MG PO TABS: 1.0000 | ORAL_TABLET | ORAL | Status: DC | PRN

## 2021-09-22 MED ORDER — MISOPROSTOL 50MCG HALF TABLET
50.0000 ug | ORAL_TABLET | ORAL | Status: DC
  Administered 2021-09-22 (×2): 50 ug via ORAL
  Filled 2021-09-22 (×2): qty 1

## 2021-09-22 MED ORDER — COCONUT OIL OIL
1.0000 | TOPICAL_OIL | Status: DC | PRN
Start: 2021-09-22 — End: 2021-09-23

## 2021-09-22 MED ORDER — OXYCODONE HCL 5 MG PO TABS: 5.0000 mg | ORAL_TABLET | ORAL | Status: DC | PRN

## 2021-09-22 MED ORDER — OXYTOCIN-SODIUM CHLORIDE 30-0.9 UT/500ML-% IV SOLN: 2.5000 [IU]/h | INTRAVENOUS | Status: DC

## 2021-09-22 MED ORDER — OXYTOCIN-SODIUM CHLORIDE 30-0.9 UT/500ML-% IV SOLN
1.0000 m[IU]/min | INTRAVENOUS | Status: DC
  Administered 2021-09-22: 2 m[IU]/min via INTRAVENOUS
  Filled 2021-09-22: qty 500

## 2021-09-22 MED ORDER — ONDANSETRON HCL 4 MG PO TABS: 4.0000 mg | ORAL_TABLET | ORAL | Status: DC | PRN

## 2021-09-22 NOTE — Progress Notes (Signed)
Patient ID: Ketura Sirek, female   DOB: 1990/08/23, 31 y.o.   MRN: [id number] SROM'ed at [phone]  Received second Cytotec at 5am  FHR stable and reassuring, category I UCs every 3 min   Dilation: 1 Effacement (%): 40 Station: -3 Presentation: Vertex Exam by:: Amado Nash, RN  Will observe for increasing labor

## 2021-09-22 NOTE — Progress Notes (Signed)
Dr. Mathis Fare called about a recent rubella status last one she looked up was from 2018 she stated she will order another Rubella lab on patient.

## 2021-09-22 NOTE — Discharge Summary (Shared)
Postpartum Discharge Summary  Date of Service updated***     Patient Name: Sarah Estes DOB: [birth year] MRN: [id number]  Date of admission: 09/22/2021 Delivery date:09/22/2021  Delivering provider: Ivin Booty  Date of discharge: 09/22/2021  Admitting diagnosis: Gestational diabetes [O24.419] Intrauterine pregnancy: [redacted]w[redacted]d    Secondary diagnosis:  Principal Problem:   Vaginal delivery Active Problems:   S/P gastric bypass   Supervision of high risk pregnancy, antepartum   Gestational diabetes  Additional problems: ***    Discharge diagnosis: Term Pregnancy Delivered                                              Post partum procedures: {Postpartum procedures:23558} Augmentation: Pitocin and Cytotec Complications: None  Hospital course: Induction of Labor With Vaginal Delivery   31y.o. yo G3P1011 at 352w0das admitted to the hospital 09/22/2021 for induction of labor.  Indication for induction: A1 DM.  Patient had an uncomplicated labor course and vaginal delivery.  Membrane Rupture Time/Date: 6:23 AM ,09/22/2021   Delivery Method:Vaginal, Spontaneous  Episiotomy: None  Lacerations:  2nd degree;Perineal  Details of delivery can be found in separate delivery note.  Patient had a routine postpartum course.  Her fasting CBG postpartum was ***.  She will have a GTT at her postpartum visit in 6 weeks.  She is eating, drinking, voiding, and ambulating without issue.  Her pain and bleeding are controlled.  She is *** feeding well.  Patient is discharged home 09/22/21.  Newborn Data: Birth date:09/22/2021  Birth time:12:05 PM  Gender:Female  Living status:Living  Apgars:7 ,9  Weight:3380 g   Magnesium Sulfate received: No BMZ received: No Rhophylac: N/A MMR: N/A - Immune  T-DaP: Given prenatally Flu: Given prenatally  Transfusion: No  Physical exam  Vitals:   09/22/21 1008 09/22/21 1031 09/22/21 1101 09/22/21 1131  BP:  125/73 127/77 (!) 153/99  Pulse:  (!) 59 71  (!) 109  Resp:      Temp: (P) 98 F (36.7 C)     TempSrc: (P) Oral     SpO2:      Weight:      Height:       General: {Exam; general:21111117} Lochia: {Desc; appropriate/inappropriate:30686::"appropriate"} Uterine Fundus: {Desc; firm/soft:30687} DVT Evaluation: {Exam; dvOIL:5797282}Labs: Lab Results  Component Value Date   WBC 11.4 (H) 09/22/2021   HGB 10.8 (L) 09/22/2021   HCT 31.8 (L) 09/22/2021   MCV 83.7 09/22/2021   PLT 319 09/22/2021      Latest Ref Rng & Units 07/06/2017    9:38 PM  CMP  Glucose 65 - 99 mg/dL 96   BUN 6 - 20 mg/dL 12   Creatinine 0.44 - 1.00 mg/dL 0.38   Sodium 135 - 145 mmol/L 134   Potassium 3.5 - 5.1 mmol/L 3.8   Chloride 101 - 111 mmol/L 107   CO2 22 - 32 mmol/L 17   Calcium 8.9 - 10.3 mg/dL 8.3   Total Protein 6.5 - 8.1 g/dL 6.1   Total Bilirubin 0.3 - 1.2 mg/dL 0.6   Alkaline Phos 38 - 126 U/L 135   AST 15 - 41 U/L 15   ALT 14 - 54 U/L 13    Edinburgh Score:    07/12/2017    4:19 PM  Edinburgh Postnatal Depression Scale Screening Tool  I have been able to laugh  and see the funny side of things. 0  I have looked forward with enjoyment to things. 0  I have blamed myself unnecessarily when things went wrong. 0  I have been anxious or worried for no good reason. 2  I have felt scared or panicky for no good reason. 3  Things have been getting on top of me. 1  I have been so unhappy that I have had difficulty sleeping. 0  I have felt sad or miserable. 0  I have been so unhappy that I have been crying. 1  The thought of harming myself has occurred to me. 0  Edinburgh Postnatal Depression Scale Total 7     After visit meds:  Allergies as of 09/22/2021       Reactions   Corticosteroids Other (See Comments)   Injections OK:  cannot take due to gastric surgery (creams or tablets?)   Molds & Smuts Shortness Of Breath   Dust, mold, cockroaches, cats tress and grass   Nsaids Other (See Comments)   Had gastric bypass surgery    Sulfa  Antibiotics Hives, Itching, Rash   Morphine And Related Itching     Med Rec must be completed prior to using this [place]***       Discharge home in stable condition Infant Feeding: Breast Infant Disposition: {CHL IP OB HOME WITH BJXFFK:92230} Discharge instruction: per After Visit Summary and Postpartum booklet. Activity: Advance as tolerated. Pelvic rest for 6 weeks.  Diet: routine diet Future Appointments:No future appointments. Follow up Visit: Message sent to [facility] by Dr. Gwenlyn Perking on 09/22/21.   Please schedule this patient for a In person postpartum visit in 6 weeks with the following provider: Any provider. Additional Postpartum F/U: 2 hour GTT  High risk pregnancy complicated by: GDM Delivery mode:  Vaginal, Spontaneous  Anticipated Birth Control:  POPs  09/22/2021 Genia Del, MD

## 2021-09-22 NOTE — Lactation Note (Signed)
This note was copied from a baby's chart. Lactation Consultation Note  Patient Name: Sarah Estes AYTKZ'S Date: 09/22/2021 Reason for consult: Initial assessment;Breastfeeding assistance;Term Age:31 years  P2, Term, Infant Female  LC entered the room and mom was holding baby. Per mom she had issues with her supply with her oldest daughter. Mom states that she latched and pumped for 2 weeks and never made any milk. LC waited for the NT to take mom to the bathroom and then set her up with a DEBP for stimulation. LC encouraged mom to pump after feedings to help with increasing her milk volume. Mom asked if she could take supplements to increase her milk supply. LC let mom know that there are some supplements that may assist with supply, but the best way to increase supply is to latch, pump, or hand express.   Per mom, she has been taught how to hand express already and feels comfortable with hand expression. LC left spoons for mom to hand express into and spoon feed baby.   LC showed mom how to set up the DEBP, how to wash pump parts, and encouraged her to pump Q 3hrs.   LC reviewed the lactation services brochure.   Mom states that she has no further questions or concerns.   Current Feeding Plan:  Breastfeed baby according to feeding cues 8+ times in 24 hours.  Pump for stimulation Q3 hrs.  Hand express and spoon feed the expressed milk to baby.  Call for latch assistance if needed.  Maternal Data Does the patient have breastfeeding experience prior to this delivery?: Yes How long did the patient breastfeed?: She tried to breastfeed her 31 year old for 2 weeks, but did not make any milk.  Feeding    LATCH Score Latch: Repeated attempts needed to sustain latch, nipple held in mouth throughout feeding, stimulation needed to elicit sucking reflex.  Audible Swallowing: Spontaneous and intermittent  Type of Nipple: Everted at rest and after stimulation  Comfort (Breast/Nipple):  Soft / non-tender  Hold (Positioning): No assistance needed to correctly position infant at breast.  LATCH Score: 9   Lactation Tools Discussed/Used Tools: Pump;Flanges Flange Size: 24 Breast pump type: Double-Electric Breast Pump Pump Education: Setup, frequency, and cleaning Reason for Pumping: Supply Issue with previous pregnancy  Interventions Interventions: Education;LC Services brochure;DEBP  Discharge Pump: DEBP;Hands Free;Personal (Per mom she has a Spectra, Medela, and a Hands free pump at home.)  Consult Status Consult Status: Follow-up Date: 09/22/21 Follow-up type: In-patient    Delene Loll 09/22/2021, 3:41 PM
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
## 2021-09-22 NOTE — Anesthesia Procedure Notes (Signed)
Epidural Patient location during procedure: OB Start time: 09/22/2021 9:30 AM End time: 09/22/2021 9:40 AM  Staffing Anesthesiologist: Mellody Dance, MD Performed: anesthesiologist   Preanesthetic Checklist Completed: patient identified, IV checked, site marked, risks and benefits discussed, monitors and equipment checked, pre-op evaluation and timeout performed  Epidural Patient position: sitting Prep: DuraPrep Patient monitoring: heart rate, cardiac monitor, continuous pulse ox and blood pressure Approach: midline Location: L2-L3 Injection technique: LOR saline  Needle:  Needle type: Tuohy  Needle gauge: 17 G Needle length: 9 cm Needle insertion depth: 7 cm Catheter type: closed end flexible Catheter size: 20 Guage Catheter at skin depth: 12 cm Test dose: negative and Other  Assessment Events: blood not aspirated, injection not painful, no injection resistance and negative IV test  Additional Notes Informed consent obtained prior to proceeding including risk of failure, 1% risk of PDPH, risk of minor discomfort and bruising.  Discussed rare but serious complications including epidural abscess, permanent nerve injury, epidural hematoma.  Discussed alternatives to epidural analgesia and patient desires to proceed.  Timeout performed pre-procedure verifying patient name, procedure, and platelet count.  Patient tolerated procedure well.

## 2021-09-22 NOTE — Anesthesia Preprocedure Evaluation (Addendum)
Anesthesia Evaluation  Patient identified by MRN, date of birth, ID band Patient awake    Reviewed: Allergy & Precautions, NPO status , Patient's Chart, lab work & pertinent test results  Airway Mallampati: II  TM Distance: >3 FB Neck ROM: Full    Dental  (+) Teeth Intact, Dental Advisory Given   Pulmonary asthma , former smoker,    Pulmonary exam normal breath sounds clear to auscultation       Cardiovascular negative cardio ROS Normal cardiovascular exam Rhythm:Regular Rate:Normal     Neuro/Psych PSYCHIATRIC DISORDERS Anxiety Depression Pseudotumor cerebri--denies neck stiffness, blurred vision, headaches    GI/Hepatic Neg liver ROS, H/o gastric bypass   Endo/Other  diabetesObesity   Renal/GU negative Renal ROS     Musculoskeletal negative musculoskeletal ROS (+)   Abdominal   Peds  Hematology negative hematology ROS (+)   Anesthesia Other Findings   Reproductive/Obstetrics                           Anesthesia Physical Anesthesia Plan  ASA: 3  Anesthesia Plan: Epidural   Post-op Pain Management:    Induction:   PONV Risk Score and Plan: 2 and Treatment may vary due to age or medical condition  Airway Management Planned: Natural Airway  Additional Equipment: None  Intra-op Plan:   Post-operative Plan:   Informed Consent: I have reviewed the patients History and Physical, chart, labs and discussed the procedure including the risks, benefits and alternatives for the proposed anesthesia with the patient or authorized representative who has indicated his/her understanding and acceptance.       Plan Discussed with: Anesthesiologist  Anesthesia Plan Comments:         Anesthesia Quick Evaluation

## 2021-09-22 NOTE — Anesthesia Postprocedure Evaluation (Signed)
Anesthesia Post Note  Patient: Sarah Estes  Procedure(s) Performed: AN AD HOC LABOR EPIDURAL     Patient location during evaluation: Mother Baby Anesthesia Type: Epidural Level of consciousness: awake and alert Pain management: pain level controlled Vital Signs Assessment: post-procedure vital signs reviewed and stable Respiratory status: spontaneous breathing, nonlabored ventilation and respiratory function stable Cardiovascular status: stable Postop Assessment: no headache, no backache and epidural receding Anesthetic complications: no   No notable events documented.  Last Vitals:  Vitals:   09/22/21 1500 09/22/21 2113  BP: 117/79 130/85  Pulse: 83 85  Resp: 18   Temp: 36.7 C 37 C  SpO2: 100% 98%    Last Pain:  Vitals:   09/22/21 2113  TempSrc: Oral  PainSc: 0-No pain   Pain Goal:                   [employer]

## 2021-09-23 ENCOUNTER — Other Ambulatory Visit: Payer: Commercial Managed Care - PPO

## 2021-09-23 LAB — RUBELLA SCREEN: Rubella: 3.67 index (ref >0.99)

## 2021-09-23 MED ORDER — DOCUSATE SODIUM 100 MG PO CAPS: 100.0000 mg | ORAL_CAPSULE | Freq: Two times a day (BID) | ORAL | 0 refills | Status: AC | PRN

## 2021-09-26 ENCOUNTER — Encounter: Payer: Self-pay | Admitting: Obstetrics & Gynecology

## 2021-09-26 ENCOUNTER — Ambulatory Visit (INDEPENDENT_AMBULATORY_CARE_PROVIDER_SITE_OTHER): Payer: Commercial Managed Care - PPO | Admitting: Obstetrics & Gynecology

## 2021-09-26 DIAGNOSIS — K645 Perianal venous thrombosis: Secondary | ICD-10-CM | POA: Diagnosis not present

## 2021-09-27 ENCOUNTER — Other Ambulatory Visit: Payer: Commercial Managed Care - PPO

## 2021-09-27 ENCOUNTER — Other Ambulatory Visit: Payer: Commercial Managed Care - PPO | Admitting: Obstetrics & Gynecology

## 2021-09-29 ENCOUNTER — Encounter: Payer: Self-pay | Admitting: *Deleted

## 2021-09-29 ENCOUNTER — Ambulatory Visit: Payer: Commercial Managed Care - PPO | Admitting: Obstetrics & Gynecology

## 2021-11-04 ENCOUNTER — Other Ambulatory Visit: Payer: Commercial Managed Care - PPO

## 2021-11-04 ENCOUNTER — Ambulatory Visit: Payer: Commercial Managed Care - PPO | Admitting: Advanced Practice Midwife

## 2021-11-04 DIAGNOSIS — Z8632 Personal history of gestational diabetes: Secondary | ICD-10-CM

## 2022-07-30 IMAGING — US US OB < 14 WEEKS - US OB TV
1 series · 15 of 28 positions shown · non-contrast
Comparison: None.

CLINICAL DATA: Vaginal bleeding x2 days.

EXAM:
OBSTETRIC <14 WK US AND TRANSVAGINAL OB US
TECHNIQUE: Both transabdominal and transvaginal ultrasound examinations were
performed for complete evaluation of the gestation as well as the
maternal uterus, adnexal regions, and pelvic cul-de-sac.
Transvaginal technique was performed to assess early pregnancy.

[Series 1: us ob < 14 weeks - us ob tv · 102 acquisitions, 15 frames shown]
[im 1/102]
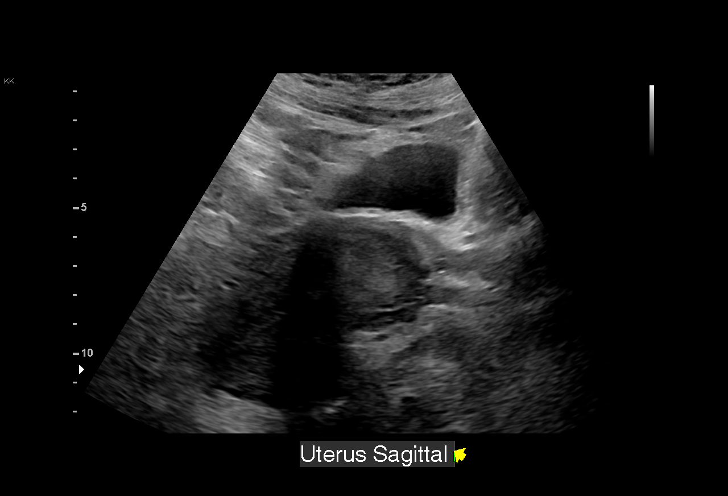
[im 8/102]
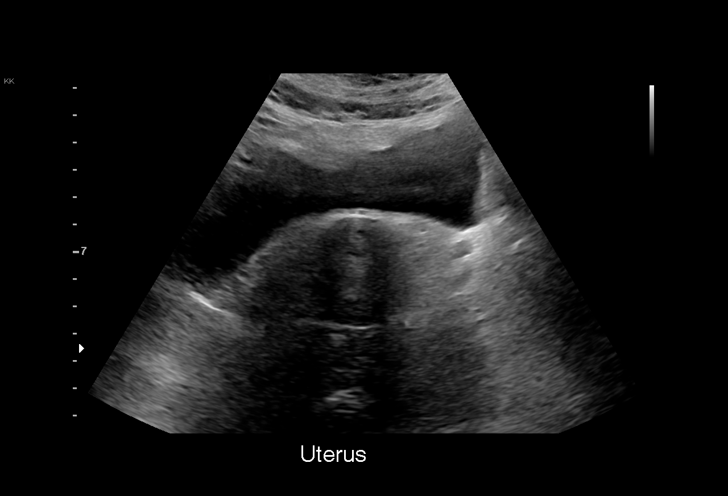
[im 15/102]
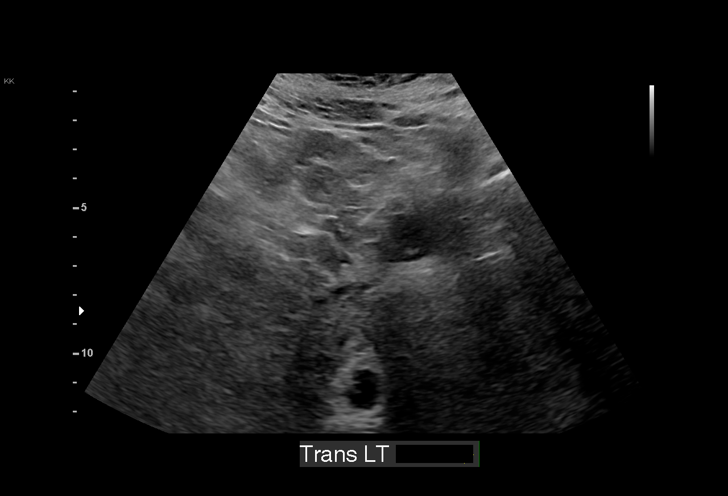
[im 23/102]
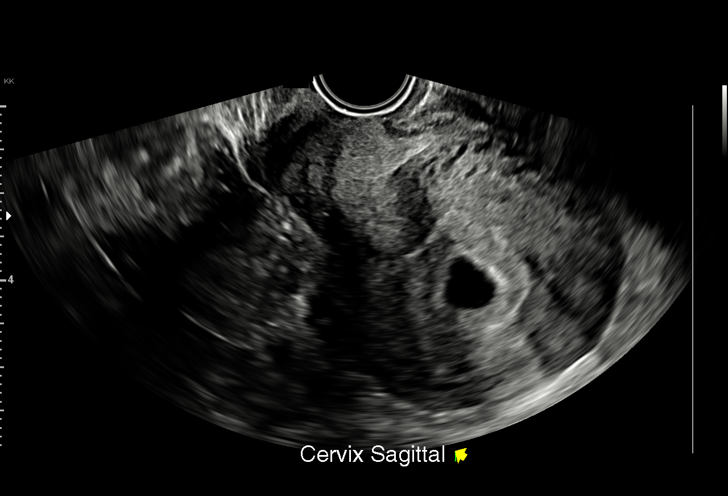
[im 30/102]
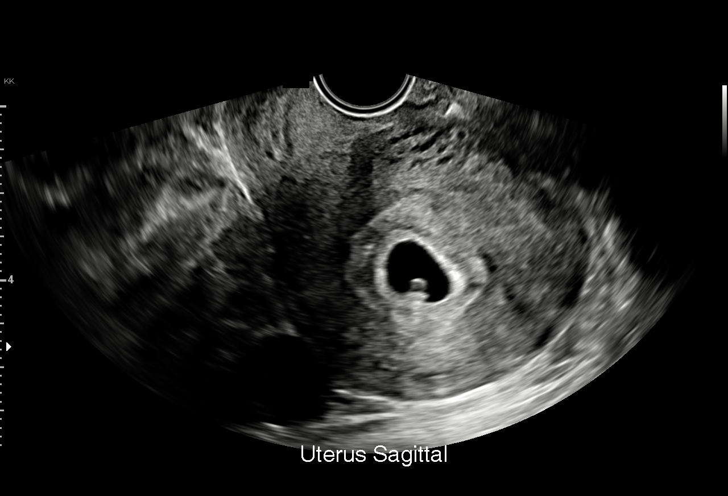
[im 38/102]
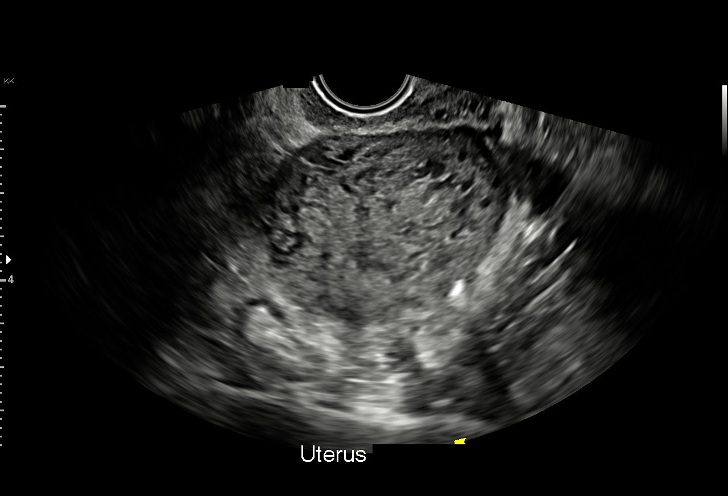
[im 45/102]
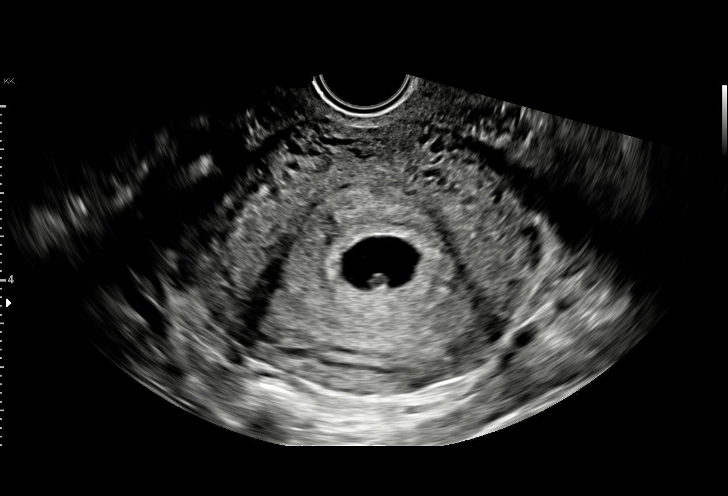
[im 53/102]
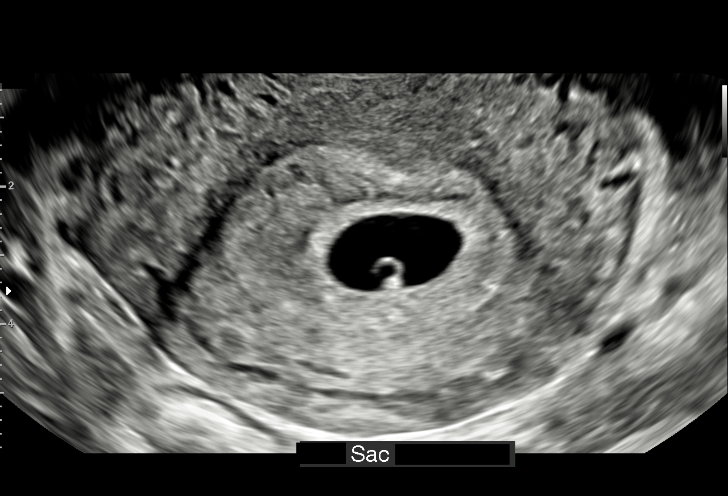
[im 57/102]
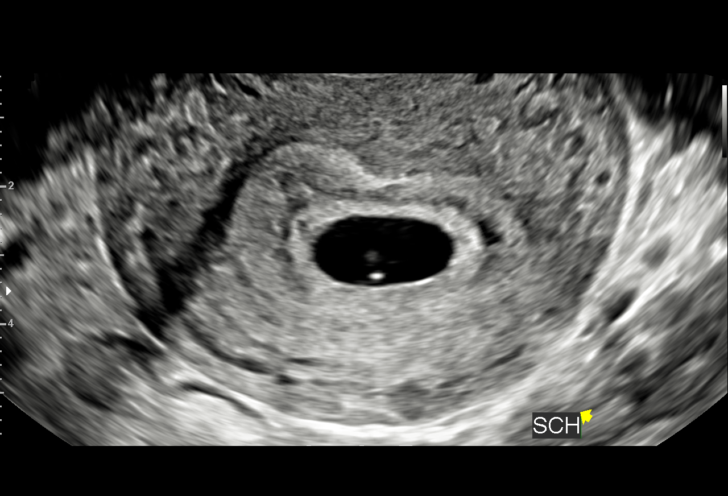
[im 64/102]
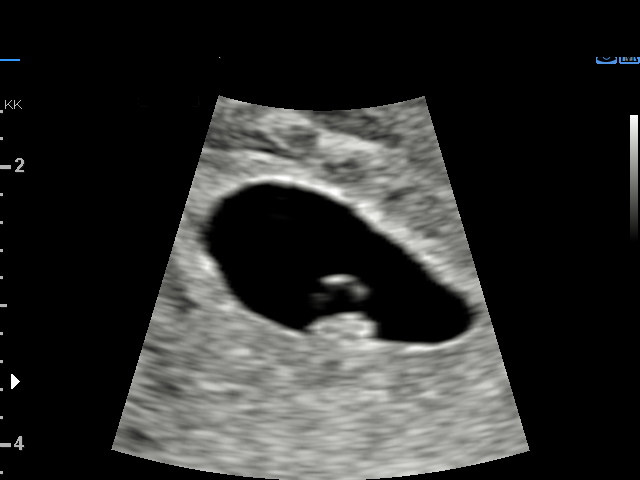
[im 72/102]
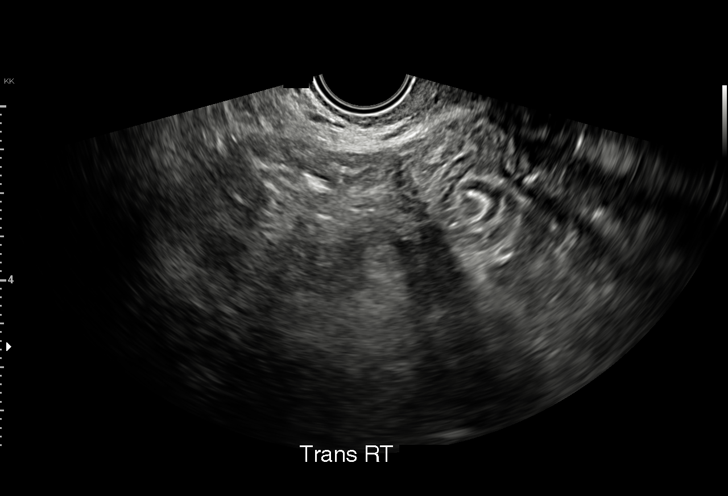
[im 79/102]
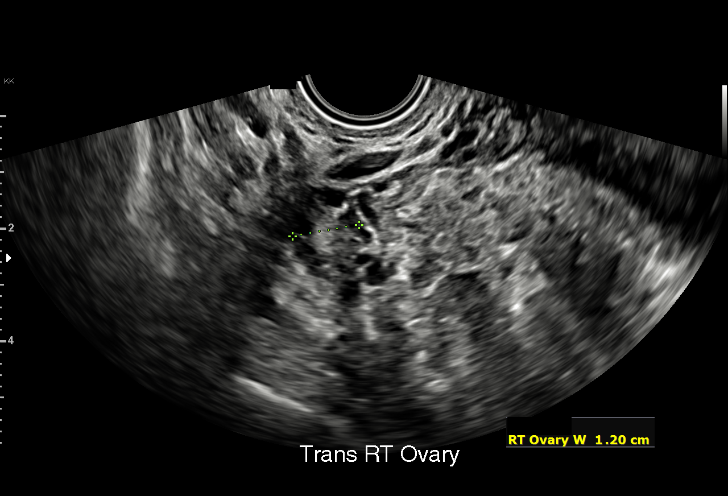
[im 87/102]
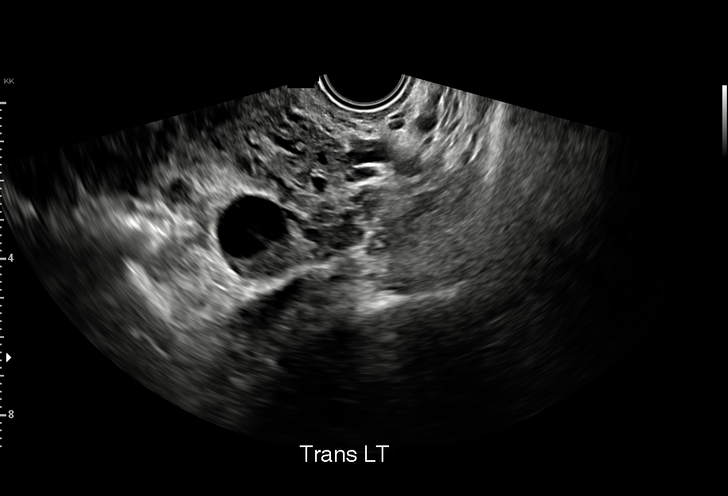
[im 94/102]
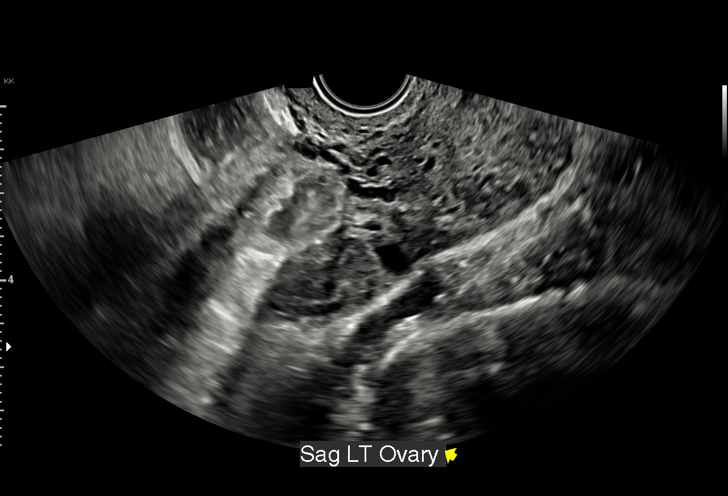
[im 102/102]
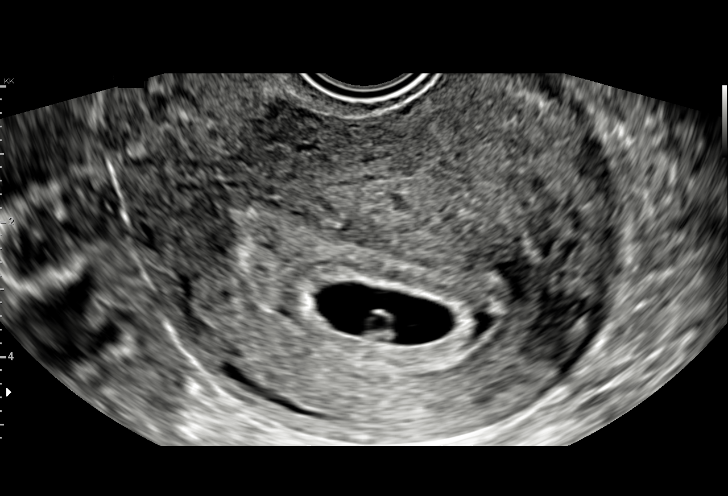

[15 of 28 positions shown; findings below may reference images not displayed]

FINDINGS: Intrauterine gestational sac: Single

Yolk sac:  Visualized.

Embryo:  Visualized.

Cardiac Activity: Visualized.

Heart Rate: 104 bpm

CRL:  5.1 mm   6 w   1 d                  US EDC: September 29, 2021

Subchorionic hemorrhage:  Small

Maternal uterus/adnexae: The right ovary is visualized and is normal
in appearance.

A corpus luteum cyst is seen within an otherwise normal appearing
left ovary.

No pelvic free fluid is seen.
IMPRESSION: Single, viable intrauterine pregnancy at approximately 6 weeks and 1
day gestation by ultrasound evaluation.
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
## 2023-05-29 ENCOUNTER — Encounter (INDEPENDENT_AMBULATORY_CARE_PROVIDER_SITE_OTHER): Payer: Self-pay

## 2023-06-05 ENCOUNTER — Encounter (INDEPENDENT_AMBULATORY_CARE_PROVIDER_SITE_OTHER): Payer: Self-pay
